# Patient Record
Sex: Female | Born: 1990 | Race: Black or African American | Hispanic: No | Marital: Single | Smoking: Former smoker
Health system: Southern US, Community
[De-identification: ages and names within clinical notes are randomized; demographics above are authoritative.]

## PROBLEM LIST (undated history)

## (undated) ENCOUNTER — Inpatient Hospital Stay (HOSPITAL_COMMUNITY): Payer: Self-pay

## (undated) ENCOUNTER — Emergency Department (HOSPITAL_COMMUNITY): Admission: EM | Payer: Medicaid Other

## (undated) ENCOUNTER — Inpatient Hospital Stay (HOSPITAL_COMMUNITY): Payer: Medicaid Other | Admitting: Obstetrics & Gynecology

## (undated) DIAGNOSIS — R062 Wheezing: Secondary | ICD-10-CM

## (undated) DIAGNOSIS — Z309 Encounter for contraceptive management, unspecified: Secondary | ICD-10-CM

## (undated) DIAGNOSIS — G43909 Migraine, unspecified, not intractable, without status migrainosus: Secondary | ICD-10-CM

## (undated) DIAGNOSIS — IMO0002 Reserved for concepts with insufficient information to code with codable children: Secondary | ICD-10-CM

## (undated) DIAGNOSIS — S39011A Strain of muscle, fascia and tendon of abdomen, initial encounter: Secondary | ICD-10-CM

## (undated) DIAGNOSIS — Z3009 Encounter for other general counseling and advice on contraception: Secondary | ICD-10-CM

## (undated) DIAGNOSIS — Z8619 Personal history of other infectious and parasitic diseases: Secondary | ICD-10-CM

## (undated) DIAGNOSIS — K429 Umbilical hernia without obstruction or gangrene: Secondary | ICD-10-CM

## (undated) DIAGNOSIS — R309 Painful micturition, unspecified: Principal | ICD-10-CM

## (undated) DIAGNOSIS — R1011 Right upper quadrant pain: Secondary | ICD-10-CM

## (undated) DIAGNOSIS — Z8742 Personal history of other diseases of the female genital tract: Secondary | ICD-10-CM

## (undated) DIAGNOSIS — R319 Hematuria, unspecified: Secondary | ICD-10-CM

## (undated) DIAGNOSIS — J40 Bronchitis, not specified as acute or chronic: Secondary | ICD-10-CM

## (undated) DIAGNOSIS — R110 Nausea: Secondary | ICD-10-CM

## (undated) DIAGNOSIS — N760 Acute vaginitis: Secondary | ICD-10-CM

## (undated) DIAGNOSIS — N921 Excessive and frequent menstruation with irregular cycle: Secondary | ICD-10-CM

## (undated) DIAGNOSIS — B9689 Other specified bacterial agents as the cause of diseases classified elsewhere: Secondary | ICD-10-CM

## (undated) DIAGNOSIS — N390 Urinary tract infection, site not specified: Secondary | ICD-10-CM

## (undated) DIAGNOSIS — N739 Female pelvic inflammatory disease, unspecified: Secondary | ICD-10-CM

## (undated) DIAGNOSIS — R634 Abnormal weight loss: Principal | ICD-10-CM

## (undated) DIAGNOSIS — B379 Candidiasis, unspecified: Secondary | ICD-10-CM

## (undated) DIAGNOSIS — N939 Abnormal uterine and vaginal bleeding, unspecified: Principal | ICD-10-CM

## (undated) DIAGNOSIS — N898 Other specified noninflammatory disorders of vagina: Secondary | ICD-10-CM

## (undated) DIAGNOSIS — N926 Irregular menstruation, unspecified: Secondary | ICD-10-CM

## (undated) DIAGNOSIS — Z30017 Encounter for initial prescription of implantable subdermal contraceptive: Principal | ICD-10-CM

## (undated) DIAGNOSIS — Z975 Presence of (intrauterine) contraceptive device: Secondary | ICD-10-CM

## (undated) DIAGNOSIS — O98211 Gonorrhea complicating pregnancy, first trimester: Secondary | ICD-10-CM

## (undated) DIAGNOSIS — R630 Anorexia: Secondary | ICD-10-CM

## (undated) HISTORY — DX: Strain of muscle, fascia and tendon of abdomen, initial encounter: S39.011A

## (undated) HISTORY — DX: Encounter for other general counseling and advice on contraception: Z30.09

## (undated) HISTORY — DX: Abnormal weight loss: R63.4

## (undated) HISTORY — DX: Personal history of other infectious and parasitic diseases: Z86.19

## (undated) HISTORY — DX: Right upper quadrant pain: R10.11

## (undated) HISTORY — DX: Anorexia: R63.0

## (undated) HISTORY — DX: Abnormal uterine and vaginal bleeding, unspecified: N93.9

## (undated) HISTORY — DX: Hematuria, unspecified: R31.9

## (undated) HISTORY — DX: Encounter for contraceptive management, unspecified: Z30.9

## (undated) HISTORY — DX: Other specified noninflammatory disorders of vagina: N89.8

## (undated) HISTORY — DX: Irregular menstruation, unspecified: N92.6

## (undated) HISTORY — DX: Wheezing: R06.2

## (undated) HISTORY — DX: Other specified bacterial agents as the cause of diseases classified elsewhere: B96.89

## (undated) HISTORY — DX: Painful micturition, unspecified: R30.9

## (undated) HISTORY — DX: Encounter for initial prescription of implantable subdermal contraceptive: Z30.017

## (undated) HISTORY — DX: Nausea: R11.0

## (undated) HISTORY — DX: Umbilical hernia without obstruction or gangrene: K42.9

## (undated) HISTORY — DX: Reserved for concepts with insufficient information to code with codable children: IMO0002

## (undated) HISTORY — DX: Personal history of other diseases of the female genital tract: Z87.42

## (undated) HISTORY — DX: Gonorrhea complicating pregnancy, first trimester: O98.211

## (undated) HISTORY — DX: Presence of (intrauterine) contraceptive device: Z97.5

## (undated) HISTORY — PX: WISDOM TOOTH EXTRACTION: SHX21

## (undated) HISTORY — DX: Candidiasis, unspecified: B37.9

## (undated) HISTORY — DX: Female pelvic inflammatory disease, unspecified: N73.9

## (undated) HISTORY — DX: Acute vaginitis: N76.0

## (undated) HISTORY — DX: Excessive and frequent menstruation with irregular cycle: N92.1

## (undated) HISTORY — PX: OTHER SURGICAL HISTORY: SHX169

---
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
## 2012-01-02 NOTE — L&D Delivery Note (Signed)
Delivery Note At 12:57 PM a viable female was delivered via Vaginal, Spontaneous Delivery (Presentation: Right Occiput Anterior) after moderate to severe variables during second stage. APGAR: 8, 9; vigorous infant. Weight 6 lb 8.1 oz (2951 g). Dr. Macon Large present in case vacuum needed--was not needed. NICU present due to preterm delivery and variables. Placenta status: Adherent, Retained. Cord: 3 vessels.  Incomplete removal by Dorathy Kinsman, CNM and Catalina Antigua MD.  1325: Placenta visible high in vagina. Unable to deliver w/ pt pushing efforts and gentle cord traction. Pitocin started. VSS. Waited  20 minutes. 1345: Placenta still would not delivery w/ normal interventions. Pitocin turned off. Fentanyl 50 IV. Attempted manual removal. Tight band btw upper and LUS. Unable to remove placenta. VSS. Pitocin turned off. Waited 5 minutes. Attempted removal again. Several small pieces removed, passed ~500 ml blood and clots so far, but not actively bleeding heavily. VSS. Fentanyl 50 nl IV given. Called attending and code hemorrhage. 2 Units PRBCs prepared.  1356: Dr. Jolayne Panther at [facility] attempting removal, unable. Nitro given to relax uterus and Dilaudid 1 mg IV given. Still unable to remove. Dr. Jolayne Panther informed pt that she is unable to remove placenta in room. Need D&C on OR. Pt gives written and verbal consent. Dr. Melynda Keller note.   Anesthesia: Local  Episiotomy: None Lacerations: None Suture Repair: NA Est. Blood Loss (mL): 700 ml  Mom to OR.  Baby to nursery-stable.  Dorathy Kinsman 03/18/2012, 3:46 PM

## 2012-01-28 ENCOUNTER — Inpatient Hospital Stay (HOSPITAL_COMMUNITY): Payer: Medicaid Other

## 2012-01-28 ENCOUNTER — Inpatient Hospital Stay (HOSPITAL_COMMUNITY)
Admission: AD | Admit: 2012-01-28 | Discharge: 2012-01-28 | Disposition: A | Discharge status: Home / Self Care | Payer: Medicaid Other | Source: Ambulatory Visit | Attending: Obstetrics & Gynecology | Admitting: Obstetrics & Gynecology

## 2012-01-28 ENCOUNTER — Encounter (HOSPITAL_COMMUNITY): Payer: Self-pay | Admitting: *Deleted

## 2012-01-28 DIAGNOSIS — F1290 Cannabis use, unspecified, uncomplicated: Secondary | ICD-10-CM | POA: Clinically undetermined

## 2012-01-28 DIAGNOSIS — O4100X0 Oligohydramnios, unspecified trimester, not applicable or unspecified: Secondary | ICD-10-CM | POA: Insufficient documentation

## 2012-01-28 DIAGNOSIS — R109 Unspecified abdominal pain: Secondary | ICD-10-CM | POA: Insufficient documentation

## 2012-01-28 LAB — AMNISURE RUPTURE OF MEMBRANE (ROM) NOT AT ARMC: Amnisure ROM: NEGATIVE

## 2012-01-28 LAB — WET PREP, GENITAL

## 2012-01-28 LAB — OB RESULTS CONSOLE RPR: RPR: NONREACTIVE

## 2012-01-28 NOTE — MAU Provider Note (Signed)
Seen by me also and agree with note Wynelle Bourgeois CNM

## 2012-01-28 NOTE — MAU Provider Note (Signed)
Attestation of Attending Supervision of Advanced Practitioner (CNM/NP): Evaluation and management procedures were performed by the Advanced Practitioner under my supervision and collaboration. I have reviewed the Advanced Practitioner's note and chart, and I agree with the management and plan.  Dezman Granda H. 10:42 PM   

## 2012-01-28 NOTE — MAU Note (Signed)
Patient states she was sent from [facility] for evaluation of low fluid. States she has been leaking clear fluid since 1-23. Not enough to wear a pad. States she has some contractions off and on but none now. Reports good fetal movement.

## 2012-01-28 NOTE — MAU Provider Note (Addendum)
History   Lori Johnston is a 22 y.o. G2P1001 at [redacted]w[redacted]d who presents to the MAU from a routine prenatal visit, where she revealed that she has been noticing fluid leakage since last Thursday (5 days ago). She reports that she had some lower abdominal pain that she thought was round ligament pain and/or stress-induced, then urinated and noticed that fluid continued to leak into the toilet after micturition was complete. Since that time, she's noticed a slow leak and has been needing to wear panty liners.  She has not noted any bleeding, cramping, or contractions, and continues to feel good fetal movement. At her routine prenatal visit today she was noted to have oligohydramnios with an AFI 6.25.   CSN: [id number]  Arrival date and time: 01/28/12 1239   None     Chief Complaint  Patient presents with  . Rupture of Membranes   HPI  OB History    Grav Para Term Preterm Abortions TAB SAB Ect Mult Living   2 1 1  0 0 0 0 0 0 1      Past Medical History  Diagnosis Date  . Bronchitis   . Migraine   . Pregnant     Past Surgical History  Procedure Date  . Wisdom tooth extraction     Family History  Problem Relation Age of Onset  . Anesthesia problems Neg Hx   . Hypotension Neg Hx   . Malignant hyperthermia Neg Hx   . Pseudochol deficiency Neg Hx     History  Substance Use Topics  . Smoking status: Former Smoker -- 0.2 packs/day for 2 years    Types: Cigarettes    Quit date: 08/12/2010  . Smokeless tobacco: Never Used  . Alcohol Use: No    Allergies:  Allergies  Allergen Reactions  . Bee Venom Anaphylaxis  . Other Anaphylaxis and Rash    Tomatoes. Swelling    Prescriptions prior to admission  Medication Sig Dispense Refill  . hydrocortisone cream 1 % Apply 1 application topically daily.      Burnis Medin w/o A Vit-FeFum-FePo-FA (PROVIDA OB) 20-20-1.25 MG CAPS Take 1 capsule by mouth daily.         Review of Systems  Constitutional: Negative for fever and chills.  Eyes:  Negative for blurred vision and double vision.  Respiratory: Negative for cough and wheezing.   Cardiovascular: Negative for chest pain.  Gastrointestinal: Negative for nausea, vomiting, abdominal pain and diarrhea.  Genitourinary: Negative for dysuria and hematuria.  Musculoskeletal: Positive for back pain.  Neurological: Positive for headaches. Negative for dizziness and tingling.   Physical Exam   Blood pressure 108/54, pulse 90, temperature 98.1 F (36.7 C), temperature source Oral, resp. rate 16, last menstrual period 07/16/2011, SpO2 100.00%, unknown if currently breastfeeding.  Physical Exam  Constitutional: She is oriented to person, place, and time. She appears well-developed and well-nourished. No distress.  HENT:  Head: Normocephalic and atraumatic.  Eyes: Pupils are equal, round, and reactive to light.  Neck: Normal range of motion. Neck supple.  Cardiovascular: Normal rate, regular rhythm, normal heart sounds and intact distal pulses.   No murmur heard. Respiratory: Effort normal and breath sounds normal. She has no wheezes.  GI: Soft. Bowel sounds are normal. There is no tenderness.       gravid  Musculoskeletal: Normal range of motion. She exhibits no edema and no tenderness.  Neurological: She is alert and oriented to person, place, and time.  Skin: Skin is warm and dry.  FHR: 140, +accels, no decels Toco: no contractions noted  SVE performed in office today, deferred in MAU Speculum exam: normal vagina, minimal white discharge, no pooling of fluid  MAU Course  Procedures Results for orders placed during the hospital encounter of 01/28/12 (from the past 24 hour(s))  AMNISURE RUPTURE OF MEMBRANE (ROM)     Status: Normal   Collection Time   01/28/12  1:43 PM      Component Value Range   Amnisure ROM NEGATIVE    WET PREP, GENITAL     Status: Abnormal   Collection Time   01/28/12  3:00 PM      Component Value Range   Yeast Wet Prep HPF POC NONE SEEN  NONE SEEN    Trich, Wet Prep NONE SEEN  NONE SEEN   Clue Cells Wet Prep HPF POC NONE SEEN  NONE SEEN   WBC, Wet Prep HPF POC FEW (*) NONE SEEN   [place] UA doppler:  S/D 2.8, 44th percentile  Assessment and Plan  Lori Johnston is a 22 y.o. G2P1001 at [redacted]w[redacted]d who presented from clinic due to oligohydramnios and possible leakage of fluid for five days. - amnisure was negative - Doppler of UA reassuring at 44th percentile - discharge home with follow up as previously scheduled  CONROY, LOUISA 01/28/2012, 2:29 PM

## 2012-03-17 ENCOUNTER — Encounter: Payer: Self-pay | Admitting: *Deleted

## 2012-03-18 ENCOUNTER — Encounter (HOSPITAL_COMMUNITY): Payer: Self-pay | Admitting: Emergency Medicine

## 2012-03-18 ENCOUNTER — Inpatient Hospital Stay (HOSPITAL_COMMUNITY)
Admission: EM | Admit: 2012-03-18 | Discharge: 2012-03-20 | DRG: 767 | Disposition: A | Discharge status: Home / Self Care | Payer: Medicaid Other | Attending: Obstetrics & Gynecology | Admitting: Obstetrics & Gynecology

## 2012-03-18 ENCOUNTER — Encounter (HOSPITAL_COMMUNITY)
Admission: EM | Disposition: A | Discharge status: Home / Self Care | Payer: Self-pay | Source: Home / Self Care | Attending: Obstetrics & Gynecology

## 2012-03-18 ENCOUNTER — Inpatient Hospital Stay (HOSPITAL_COMMUNITY): Payer: Medicaid Other | Admitting: Anesthesiology

## 2012-03-18 ENCOUNTER — Encounter (HOSPITAL_COMMUNITY): Payer: Self-pay | Admitting: Anesthesiology

## 2012-03-18 DIAGNOSIS — O9903 Anemia complicating the puerperium: Secondary | ICD-10-CM | POA: Diagnosis not present

## 2012-03-18 DIAGNOSIS — Z3490 Encounter for supervision of normal pregnancy, unspecified, unspecified trimester: Secondary | ICD-10-CM

## 2012-03-18 DIAGNOSIS — O4290 Premature rupture of membranes, unspecified as to length of time between rupture and onset of labor, unspecified weeks of gestation: Principal | ICD-10-CM | POA: Diagnosis present

## 2012-03-18 DIAGNOSIS — O4100X0 Oligohydramnios, unspecified trimester, not applicable or unspecified: Secondary | ICD-10-CM | POA: Diagnosis present

## 2012-03-18 DIAGNOSIS — IMO0001 Reserved for inherently not codable concepts without codable children: Secondary | ICD-10-CM

## 2012-03-18 DIAGNOSIS — D649 Anemia, unspecified: Secondary | ICD-10-CM | POA: Diagnosis not present

## 2012-03-18 HISTORY — PX: DILATION AND CURETTAGE OF UTERUS: SHX78

## 2012-03-18 LAB — CBC
HCT: 20.7 % — ABNORMAL LOW (ref 36.0–46.0)
Hemoglobin: 7.7 g/dL — ABNORMAL LOW (ref 12.0–15.0)
Hemoglobin: 6.9 g/dL — CL (ref 12.0–15.0)
MCH: 27.3 pg (ref 26.0–34.0)
MCH: 27.4 pg (ref 26.0–34.0)
MCHC: 33.1 g/dL (ref 30.0–36.0)
MCHC: 33.5 g/dL (ref 30.0–36.0)
MCHC: 33.3 g/dL (ref 30.0–36.0)
MCV: 82.1 fL (ref 78.0–100.0)
Platelets: 179 10*3/uL (ref 150–400)
RDW: 13.7 % (ref 11.5–15.5)

## 2012-03-18 LAB — PROTIME-INR: Prothrombin Time: 14.3 seconds (ref 11.6–15.2)

## 2012-03-18 LAB — POSTPARTUM HEMORRHAGE PROTOCOL (BB NOTIFICATION)

## 2012-03-18 LAB — SAVE SMEAR

## 2012-03-18 LAB — GROUP B STREP BY PCR: Group B strep by PCR: NEGATIVE

## 2012-03-18 LAB — POCT FERN TEST: POCT Fern Test: POSITIVE

## 2012-03-18 LAB — RPR: RPR Ser Ql: NONREACTIVE

## 2012-03-18 SURGERY — DILATION AND CURETTAGE
Anesthesia: Spinal | Site: Vagina

## 2012-03-18 MED ORDER — WITCH HAZEL-GLYCERIN EX PADS: 1.0000 "application " | MEDICATED_PAD | CUTANEOUS | Status: DC | PRN

## 2012-03-18 MED ORDER — ONDANSETRON HCL 4 MG/2ML IJ SOLN: 4.0000 mg | Freq: Four times a day (QID) | INTRAMUSCULAR | Status: DC | PRN

## 2012-03-18 MED ORDER — ONDANSETRON HCL 4 MG/2ML IJ SOLN
INTRAMUSCULAR | Status: AC
  Filled 2012-03-18: qty 2

## 2012-03-18 MED ORDER — OXYTOCIN BOLUS FROM INFUSION: 500.0000 mL | INTRAVENOUS | Status: DC

## 2012-03-18 MED ORDER — PRENATAL MULTIVITAMIN CH
1.0000 | ORAL_TABLET | Freq: Every day | ORAL | Status: DC
  Administered 2012-03-19 – 2012-03-20 (×2): 1 via ORAL
  Filled 2012-03-18 (×2): qty 1

## 2012-03-18 MED ORDER — PHENYLEPHRINE 40 MCG/ML (10ML) SYRINGE FOR IV PUSH (FOR BLOOD PRESSURE SUPPORT)
PREFILLED_SYRINGE | INTRAVENOUS | Status: AC
  Filled 2012-03-18: qty 5

## 2012-03-18 MED ORDER — DIBUCAINE 1 % RE OINT: 1.0000 "application " | TOPICAL_OINTMENT | RECTAL | Status: DC | PRN

## 2012-03-18 MED ORDER — MISOPROSTOL 200 MCG PO TABS
ORAL_TABLET | ORAL | Status: AC
  Filled 2012-03-18: qty 5

## 2012-03-18 MED ORDER — LANOLIN HYDROUS EX OINT: TOPICAL_OINTMENT | CUTANEOUS | Status: DC | PRN

## 2012-03-18 MED ORDER — TETANUS-DIPHTH-ACELL PERTUSSIS 5-2.5-18.5 LF-MCG/0.5 IM SUSP: 0.5000 mL | Freq: Once | INTRAMUSCULAR | Status: DC

## 2012-03-18 MED ORDER — LIDOCAINE IN DEXTROSE 5-7.5 % IV SOLN
INTRAVENOUS | Status: AC
  Filled 2012-03-18: qty 2

## 2012-03-18 MED ORDER — PENICILLIN G POTASSIUM 5000000 UNITS IJ SOLR
2.5000 10*6.[IU] | INTRAMUSCULAR | Status: DC
  Filled 2012-03-18 (×5): qty 2.5

## 2012-03-18 MED ORDER — FENTANYL CITRATE 0.05 MG/ML IJ SOLN
INTRAMUSCULAR | Status: AC
  Filled 2012-03-18: qty 5

## 2012-03-18 MED ORDER — ACETAMINOPHEN 325 MG PO TABS: 650.0000 mg | ORAL_TABLET | ORAL | Status: DC | PRN

## 2012-03-18 MED ORDER — OXYCODONE-ACETAMINOPHEN 5-325 MG PO TABS: 1.0000 | ORAL_TABLET | ORAL | Status: DC | PRN

## 2012-03-18 MED ORDER — FENTANYL CITRATE 0.05 MG/ML IJ SOLN: 25.0000 ug | INTRAMUSCULAR | Status: DC | PRN

## 2012-03-18 MED ORDER — MISOPROSTOL 100 MCG PO TABS
ORAL_TABLET | ORAL | Status: DC | PRN
  Administered 2012-03-18: 1000 ug

## 2012-03-18 MED ORDER — NITROGLYCERIN 0.4 MG/SPRAY TL SOLN
Status: DC | PRN
  Administered 2012-03-18: 2 via SUBLINGUAL

## 2012-03-18 MED ORDER — ONDANSETRON HCL 4 MG PO TABS: 4.0000 mg | ORAL_TABLET | ORAL | Status: DC | PRN

## 2012-03-18 MED ORDER — IBUPROFEN 600 MG PO TABS
600.0000 mg | ORAL_TABLET | Freq: Four times a day (QID) | ORAL | Status: DC
  Administered 2012-03-18 – 2012-03-20 (×7): 600 mg via ORAL
  Filled 2012-03-18 (×7): qty 1

## 2012-03-18 MED ORDER — EPHEDRINE SULFATE 50 MG/ML IJ SOLN
INTRAMUSCULAR | Status: DC | PRN
  Administered 2012-03-18 (×3): 5 mg via INTRAVENOUS

## 2012-03-18 MED ORDER — SIMETHICONE 80 MG PO CHEW: 80.0000 mg | CHEWABLE_TABLET | ORAL | Status: DC | PRN

## 2012-03-18 MED ORDER — LACTATED RINGERS IV SOLN: 500.0000 mL | INTRAVENOUS | Status: DC | PRN

## 2012-03-18 MED ORDER — MIDAZOLAM HCL 2 MG/2ML IJ SOLN
INTRAMUSCULAR | Status: AC
  Filled 2012-03-18: qty 2

## 2012-03-18 MED ORDER — OXYTOCIN 40 UNITS IN LACTATED RINGERS INFUSION - SIMPLE MED
62.5000 mL/h | INTRAVENOUS | Status: DC
  Administered 2012-03-18: 62.5 mL/h via INTRAVENOUS
  Filled 2012-03-18: qty 1000

## 2012-03-18 MED ORDER — HYDROMORPHONE HCL PF 1 MG/ML IJ SOLN: 2.0000 mg | Freq: Once | INTRAMUSCULAR | Status: DC

## 2012-03-18 MED ORDER — CHLOROPROCAINE HCL 1 % IJ SOLN
INTRAMUSCULAR | Status: AC
  Filled 2012-03-18: qty 30

## 2012-03-18 MED ORDER — ONDANSETRON HCL 4 MG/2ML IJ SOLN: 4.0000 mg | INTRAMUSCULAR | Status: DC | PRN

## 2012-03-18 MED ORDER — LACTATED RINGERS IV SOLN
INTRAVENOUS | Status: DC
  Administered 2012-03-18 (×2): via INTRAVENOUS

## 2012-03-18 MED ORDER — DIPHENHYDRAMINE HCL 25 MG PO CAPS: 25.0000 mg | ORAL_CAPSULE | Freq: Four times a day (QID) | ORAL | Status: DC | PRN

## 2012-03-18 MED ORDER — PIPERACILLIN-TAZOBACTAM 3.375 G IVPB
3.3750 g | Freq: Three times a day (TID) | INTRAVENOUS | Status: AC
  Administered 2012-03-18 – 2012-03-19 (×3): 3.375 g via INTRAVENOUS
  Filled 2012-03-18 (×3): qty 50

## 2012-03-18 MED ORDER — NITROGLYCERIN 0.4 MG/SPRAY TL SOLN
1.0000 | Status: DC | PRN
  Administered 2012-03-18: 3 via SUBLINGUAL
  Filled 2012-03-18: qty 4.9

## 2012-03-18 MED ORDER — FENTANYL CITRATE 0.05 MG/ML IJ SOLN
50.0000 ug | INTRAMUSCULAR | Status: DC | PRN
  Administered 2012-03-18 (×3): 50 ug via INTRAVENOUS
  Filled 2012-03-18 (×2): qty 2

## 2012-03-18 MED ORDER — LIDOCAINE HCL (PF) 1 % IJ SOLN
30.0000 mL | INTRAMUSCULAR | Status: AC | PRN
  Administered 2012-03-18: 30 mL via SUBCUTANEOUS
  Filled 2012-03-18 (×2): qty 30

## 2012-03-18 MED ORDER — PROPOFOL 10 MG/ML IV EMUL
INTRAVENOUS | Status: AC
  Filled 2012-03-18: qty 20

## 2012-03-18 MED ORDER — DEXTROSE 5 % IV SOLN
5.0000 10*6.[IU] | Freq: Once | INTRAVENOUS | Status: AC
  Administered 2012-03-18: 5 10*6.[IU] via INTRAVENOUS
  Filled 2012-03-18: qty 5

## 2012-03-18 MED ORDER — SODIUM CHLORIDE 0.9 % IV SOLN
INTRAVENOUS | Status: DC | PRN
  Administered 2012-03-18: 14:00:00 via INTRAVENOUS

## 2012-03-18 MED ORDER — IBUPROFEN 600 MG PO TABS: 600.0000 mg | ORAL_TABLET | Freq: Four times a day (QID) | ORAL | Status: DC | PRN

## 2012-03-18 MED ORDER — OXYTOCIN 10 UNIT/ML IJ SOLN
40.0000 [IU] | INTRAVENOUS | Status: DC | PRN
  Administered 2012-03-18: 40 [IU] via INTRAVENOUS

## 2012-03-18 MED ORDER — HYDROMORPHONE BOLUS VIA INFUSION: 2.0000 mg | Freq: Once | INTRAVENOUS | Status: DC

## 2012-03-18 MED ORDER — SENNOSIDES-DOCUSATE SODIUM 8.6-50 MG PO TABS
2.0000 | ORAL_TABLET | Freq: Every day | ORAL | Status: DC
  Administered 2012-03-18 – 2012-03-19 (×2): 2 via ORAL

## 2012-03-18 MED ORDER — CITRIC ACID-SODIUM CITRATE 334-500 MG/5ML PO SOLN
30.0000 mL | ORAL | Status: DC | PRN
  Administered 2012-03-18: 30 mL via ORAL
  Filled 2012-03-18: qty 15

## 2012-03-18 MED ORDER — ONDANSETRON HCL 4 MG/2ML IJ SOLN
INTRAMUSCULAR | Status: DC | PRN
  Administered 2012-03-18: 4 mg via INTRAVENOUS

## 2012-03-18 MED ORDER — EPHEDRINE 5 MG/ML INJ
INTRAVENOUS | Status: AC
  Filled 2012-03-18: qty 10

## 2012-03-18 MED ORDER — HYDROMORPHONE HCL PF 1 MG/ML IJ SOLN
INTRAMUSCULAR | Status: AC
  Administered 2012-03-18: 1 mg
  Filled 2012-03-18: qty 2

## 2012-03-18 MED ORDER — KETOROLAC TROMETHAMINE 30 MG/ML IJ SOLN: 15.0000 mg | Freq: Once | INTRAMUSCULAR | Status: DC | PRN

## 2012-03-18 MED ORDER — BENZOCAINE-MENTHOL 20-0.5 % EX AERO: 1.0000 "application " | INHALATION_SPRAY | CUTANEOUS | Status: DC | PRN

## 2012-03-18 MED ORDER — LIDOCAINE IN DEXTROSE 5-7.5 % IV SOLN
INTRAVENOUS | Status: DC | PRN
  Administered 2012-03-18: 1.2 mL via INTRATHECAL

## 2012-03-18 MED ORDER — PHENYLEPHRINE HCL 10 MG/ML IJ SOLN
INTRAMUSCULAR | Status: DC | PRN
  Administered 2012-03-18 (×2): 40 ug via INTRAVENOUS
  Administered 2012-03-18: 80 ug via INTRAVENOUS
  Administered 2012-03-18: 40 ug via INTRAVENOUS

## 2012-03-18 MED ORDER — NITROGLYCERIN 0.4 MG/SPRAY TL SOLN
Status: AC
  Filled 2012-03-18: qty 4.9

## 2012-03-18 SURGICAL SUPPLY — 17 items
CATH ROBINSON RED A/P 16FR (CATHETERS) ×2 IMPLANT
DECANTER SPIKE VIAL GLASS SM (MISCELLANEOUS) ×2 IMPLANT
DRESSING TELFA 8X3 (GAUZE/BANDAGES/DRESSINGS) ×2 IMPLANT
GLOVE BIOGEL PI IND STRL 6.5 (GLOVE) ×1 IMPLANT
GLOVE BIOGEL PI INDICATOR 6.5 (GLOVE) ×1
GLOVE SURG SS PI 6.0 STRL IVOR (GLOVE) ×2 IMPLANT
GOWN STRL REIN XL XLG (GOWN DISPOSABLE) ×4 IMPLANT
NEEDLE SPNL 22GX3.5 QUINCKE BK (NEEDLE) ×2 IMPLANT
NS IRRIG 1000ML POUR BTL (IV SOLUTION) ×2 IMPLANT
PACK VAGINAL MINOR WOMEN LF (CUSTOM PROCEDURE TRAY) ×2 IMPLANT
PAD OB MATERNITY 4.3X12.25 (PERSONAL CARE ITEMS) ×2 IMPLANT
PAD PREP 24X48 CUFFED NSTRL (MISCELLANEOUS) ×2 IMPLANT
SYR CONTROL 10ML LL (SYRINGE) ×2 IMPLANT
TOWEL OR 17X24 6PK STRL BLUE (TOWEL DISPOSABLE) ×4 IMPLANT
TUBE VACURETTE 2ND TRIMESTER (CANNULA) ×2 IMPLANT
VACURETTE 16MM ASPIR CVD .5 (CANNULA) ×2 IMPLANT
WATER STERILE IRR 1000ML POUR (IV SOLUTION) ×2 IMPLANT

## 2012-03-18 NOTE — Progress Notes (Signed)
Lori Johnston is a 22 y.o. G2P1001 at [redacted]w[redacted]d.  Subjective: Contractions stronger and closer. Requesting IV pain meds, but not epidural.   Objective: BP 118/62  Pulse 87  Temp(Src) 98.2 F (36.8 C) (Oral)  Resp 18  Ht 5\' 8"  (1.727 m)  Wt 68.947 kg (152 lb)  BMI 23.12 kg/m2  SpO2 100%  LMP 07/16/2011      FHT:  FHR: 130 bpm, variability: moderate,  accelerations:  Present,  decelerations:  Absent UC:   regular, every 5 minutes SVE:   Dilation: 6 Effacement (%): 90 Station: 0 Exam by:: Dorathy Kinsman, CNM  Labs: Lab Results  Component Value Date   WBC 13.4* 03/18/2012   HGB 9.1* 03/18/2012   HCT 27.5* 03/18/2012   MCV 81.8 03/18/2012   PLT 179 03/18/2012    Assessment / Plan: Spontaneous labor, progressing normally  Labor: Progressing normally Preeclampsia:  NA Fetal Wellbeing:  Category I Pain Control:  Fentanyl I/D:  n/a Anticipated MOD:  NSVD NICU notified of impending PTD.   Dorathy Kinsman 03/18/2012, 10:33 AM

## 2012-03-18 NOTE — H&P (Signed)
Lori Johnston is a 22 y.o. female presenting for SROM. Maternal Medical History:  Reason for admission: Rupture of membranes.  Nausea.  Contractions: Onset was 1-2 hours ago.   Frequency: regular.   Duration is approximately 1 minute.   Perceived severity is moderate.    Fetal activity: Perceived fetal activity is normal.   Last perceived fetal movement was within the past hour.    Prenatal complications: Oligohydramnios.   No bleeding, HIV, IUGR, polyhydramnios or pre-eclampsia.     # SROM: Pt mentions being awaken from sleep due to SROM while at home at approximately 0200.  Pt was then transferred from [facility] for anticipated SVD.  Pt reports recent continious fetal movement, denies large loss of blood, recent increased in discharge, pain with urination or increased urinary frequency.    OB History   Grav Para Term Preterm Abortions TAB SAB Ect Mult Living   2 1 1  0 0 0 0 0 0 1     Past Medical History  Diagnosis Date  . Bronchitis   . Migraine   . Pregnant   . History of chlamydia    Past Surgical History  Procedure Laterality Date  . Wisdom tooth extraction     Family History: family history includes Diabetes in her cousin.  There is no history of Anesthesia problems, and Hypotension, and Malignant hyperthermia, and Pseudochol deficiency, . Social History:  reports that she quit smoking about 19 months ago. Her smoking use included Cigarettes. She has a .4 pack-year smoking history. She has never used smokeless tobacco. She reports that she does not drink alcohol or use illicit drugs.   Prenatal Transfer Tool  Maternal Diabetes: No Genetic Screening: Normal Maternal Ultrasounds/Referrals: Normal Fetal Ultrasounds or other Referrals:  None Maternal Substance Abuse:  No Significant Maternal Medications:  None Significant Maternal Lab Results:  Lab values include: Other: GBS unknown, positive THC Other Comments:  None  Review of Systems  Constitutional: Negative for  fever and chills.  Eyes: Negative for blurred vision and double vision.  Respiratory: Negative for cough and hemoptysis.   Cardiovascular: Negative for chest pain, palpitations, orthopnea and leg swelling.  Gastrointestinal: Positive for abdominal pain. Negative for nausea, vomiting, diarrhea, constipation and blood in stool.  Genitourinary: Negative for dysuria and urgency.  Musculoskeletal: Negative for myalgias and joint pain.  Skin: Negative for itching and rash.  Neurological: Negative for headaches.  Endo/Heme/Allergies: Negative for environmental allergies. Does not bruise/bleed easily.  All other systems reviewed and are negative.      Blood pressure 107/56, pulse 84, temperature 98.8 F (37.1 C), temperature source Oral, resp. rate 18, height 5\' 8"  (1.727 m), weight 68.947 kg (152 lb), last menstrual period 07/16/2011, SpO2 100.00%. Maternal Exam:  Uterine Assessment: Contraction strength is moderate.  Contraction duration is 1 minute. Contraction frequency is regular.   Introitus: Ferning test: positive.   Cervix: not evaluated.   Fetal Exam Fetal Monitor Review: Mode: ultrasound.   Baseline rate: 140.  Variability: moderate (6-25 bpm).   Pattern: accelerations present and no decelerations.    Fetal State Assessment: Category I - tracings are normal.     Physical Exam  Constitutional: She is oriented to person, place, and time. She appears well-developed and well-nourished.  HENT:  Head: Normocephalic and atraumatic.  Eyes: EOM are normal. Pupils are equal, round, and reactive to light.  Neck: Normal range of motion. Neck supple.  Cardiovascular: Normal rate, regular rhythm, normal heart sounds and intact distal pulses.  Exam  reveals no gallop and no friction rub.   No murmur heard. Respiratory: Effort normal and breath sounds normal. No respiratory distress. She has no wheezes. She has no rales.  GI: Soft. Bowel sounds are normal. There is no tenderness. There  is no rebound.  Musculoskeletal: Normal range of motion. She exhibits no edema and no tenderness.  Neurological: She is alert and oriented to person, place, and time.  Skin: Skin is warm.  Psychiatric: She has a normal mood and affect. Her behavior is normal. Thought content normal.    Cervical exam: Pt reports 2cm dilation from referring hospital.   Prenatal labs: ABO, Rh: O/Positive/-- (09/30 0000) Antibody: Negative (09/30 0000) Rubella: Immune (09/30 0000) RPR: Nonreactive (01/27 0000)  HBsAg: Negative (09/30 0000)  HIV: Non-reactive (01/27 0000)  GBS: Pending    Assessment/Plan: 22 y/o G2P1001 at [redacted]w[redacted]d presenting with SROM  # SOL: Pt reports large gush of fluid at 0200 with positive nitrazine test at referring hospital.   -- positive ferning on exam -- admit to L&D for anticipated NSVD -- pcn prophylaxis for GBS unknown status   DISPO: Diet: thin Activity: up ab lib Nursing: Per floor protocol Electrolytes: replace as needed Prophylaxis: GI: none; DVT early ambulation Code: Full  Ancipitate the pt to be admitted for 2 midnight(s) for anticipated NSVD.      Gregor Hams 03/18/2012, 7:07 AM  Dating by LMP, verified by 11 week [place].   I was present for the exam and agree with above.  [place], CNM 03/18/2012 11:26 AM

## 2012-03-18 NOTE — Progress Notes (Signed)
03/18/12 1500  Clinical Encounter Type  Visited With Patient not available  Visit Type Patient in surgery  Referral From Nurse   Spiritual Care is aware of postpartum hemorrhage and pt's trip to OR.  Following from a distance this afternoon and will follow up with patient and family tomorrow.   Please page [phone] for spiritual/emotional support as needed/desired.  Thank you.  [address] [place], [place] [phone]

## 2012-03-18 NOTE — ED Notes (Signed)
Pt [redacted] weeks pregnant, states she thinks her water broke. Due date is 4-21. States this is her second child. Unsure of how far apart contractions are but states she is having them.

## 2012-03-18 NOTE — ED Notes (Signed)
[facility] here 

## 2012-03-18 NOTE — Transfer of Care (Signed)
Immediate Anesthesia Transfer of Care Note  Patient: Lori Johnston  Procedure(s) Performed: Procedure(s): DILATATION AND CURETTAGE (N/A)  Patient Location: PACU  Anesthesia Type:Spinal  Level of Consciousness: awake, alert  and oriented  Airway & Oxygen Therapy: Patient Spontanous Breathing  Post-op Assessment: Report given to PACU RN and Post -op Vital signs reviewed and unstable, Anesthesiologist notified  Post vital signs: stable  Complications: No apparent anesthesia complications

## 2012-03-18 NOTE — ED Notes (Signed)
Dr Hyacinth Meeker in room assessing pt, doing cervical exam. States pt is 2cm but completely effaced.

## 2012-03-18 NOTE — MAU Note (Signed)
Large gush of bloody fluid at 0400 am this morning. Contractions since then, hasn't timed them.

## 2012-03-18 NOTE — ED Notes (Signed)
Reported to Sam from [facility], ETA 10 minutes.

## 2012-03-18 NOTE — Op Note (Addendum)
Lori Johnston PROCEDURE DATE: 03/18/2012  PREOPERATIVE DIAGNOSIS: Retained placenta POSTOPERATIVE DIAGNOSIS: The same. PROCEDURE:     Dilation and Evacuation. SURGEON:  Dr. Catalina Antigua  INDICATIONS: 22 y.o. [id number] s/p vaginal delivery of 35 week infant with retained placenta s/p cord evoltion, needing surgical completion.  Risks of surgery were discussed with the patient including but not limited to: bleeding which may require transfusion; infection which may require antibiotics; injury to uterus or surrounding organs;need for additional procedures including laparotomy or laparoscopy; possibility of intrauterine scarring which may impair future fertility; and other postoperative/anesthesia complications. Written informed consent was obtained.    FINDINGS:  A 24 week size postpartum uterus, moderate amounts of products of conception, specimen sent to pathology.  ANESTHESIA:    Spinal  INTRAVENOUS FLUIDS:  2200 ml of LR ESTIMATED BLOOD LOSS:  500 ml. SPECIMENS:  Products of conception sent to pathology COMPLICATIONS:  None immediate.  PROCEDURE DETAILS:  The patient was taken to the operating room where anesthesia was administered and was found to be adequate.  After an adequate timeout was performed, she was placed in the dorsal lithotomy position and examined; then prepped and draped in the sterile manner.  A vaginal speculum was then placed in the patient's vagina and a ring Forceps was applied to the anterior lip of the cervix. Under ultrasound guidance, a 16 mm-suction curette that was gently advanced to the uterine fundus.  The suction device was then activated and curette slowly rotated to clear the uterus of products of conception.  A sharp curettage was then performed to confirm complete emptying of the uterus.  A bimanual exam was then performed to grasped and removed the remaining portion of the placenta which was found adherent to the left cornual region.   All instruments were removed  from the patient's vagina. 1000 mcg cytotec was administered per rectumThe patient tolerated the procedure well and was taken to the recovery area awake, and in stable condition.  The patient will be transferred to postpartum unit and closely monitored. A cbc will obtained in recovery room and decision to transfuse will be made on results and hemodynamic status.

## 2012-03-18 NOTE — ED Provider Notes (Signed)
History     CSN: [id number]  Arrival date & time 03/18/12  0449   First MD Initiated Contact with Patient 03/18/12 0510      Chief Complaint  Patient presents with  . Contractions    (Consider location/radiation/quality/duration/timing/severity/associated sxs/prior treatment) HPI Comments: 22 year old G2 P1 female at 24 weeks pregnancy presents with feeling like her water has broken. She states that she was having contractions throughout the day, has recently been seen by her OB/GYN and told that she was dilated to a 2 at that time. The contractions have not been measured throughout the day but have been coming more frequently, she has had a heavy gush of fluid and presents to the emergency department. The symptoms are persistent, gradually worsening, nothing makes it better or worse, not associated with fevers chills nausea vomiting cough or shortness of breath. She denies any consultations of pregnancy other than occasional urinary infections.  The history is provided by the patient.    Past Medical History  Diagnosis Date  . Bronchitis   . Migraine   . Pregnant   . History of chlamydia     Past Surgical History  Procedure Laterality Date  . Wisdom tooth extraction      Family History  Problem Relation Age of Onset  . Anesthesia problems Neg Hx   . Hypotension Neg Hx   . Malignant hyperthermia Neg Hx   . Pseudochol deficiency Neg Hx   . Diabetes Cousin     History  Substance Use Topics  . Smoking status: Former Smoker -- 0.20 packs/day for 2 years    Types: Cigarettes    Quit date: 08/12/2010  . Smokeless tobacco: Never Used  . Alcohol Use: No    OB History   Grav Para Term Preterm Abortions TAB SAB Ect Mult Living   2 1 1  0 0 0 0 0 0 1      Review of Systems  All other systems reviewed and are negative.    Allergies  Bee venom and Other  Home Medications   Current Outpatient Rx  Name  Route  Sig  Dispense  Refill  . hydrocortisone cream 1 %  Topical   Apply 1 application topically daily.         Burnis Medin w/o A Vit-FeFum-FePo-FA ([facility]) 20-20-1.25 MG CAPS   Oral   Take 1 capsule by mouth daily.            BP 98/71  Pulse 98  Resp 19  Ht 5\' 8"  (1.727 m)  Wt 152 lb (68.947 kg)  BMI 23.12 kg/m2  SpO2 100%  LMP 07/16/2011  Physical Exam  Nursing note and vitals reviewed. Constitutional: She appears well-developed and well-nourished. No distress.  HENT:  Head: Normocephalic and atraumatic.  Mouth/Throat: Oropharynx is clear and moist. No oropharyngeal exudate.  Eyes: Conjunctivae and EOM are normal. Pupils are equal, round, and reactive to light. Right eye exhibits no discharge. Left eye exhibits no discharge. No scleral icterus.  Neck: Normal range of motion. Neck supple. No JVD present. No thyromegaly present.  Cardiovascular: Normal rate, regular rhythm, normal heart sounds and intact distal pulses.  Exam reveals no gallop and no friction rub.   No murmur heard. Pulmonary/Chest: Effort normal and breath sounds normal. No respiratory distress. She has no wheezes. She has no rales.  Abdominal: Soft. Bowel sounds are normal. She exhibits distension ( Gravid). She exhibits no mass. There is no tenderness.  Genitourinary:  Chaperone present for exam, fetus  has low station, dilation to a 2, cervix is almost 90 % effaced  Musculoskeletal: Normal range of motion. She exhibits no edema and no tenderness.  Lymphadenopathy:    She has no cervical adenopathy.  Neurological: She is alert. Coordination normal.  Skin: Skin is warm and dry. No rash noted. No erythema.  Psychiatric: She has a normal mood and affect. Her behavior is normal.    ED Course  Procedures (including critical care time)  Labs Reviewed - No data to display No results found.   1. Active labor   2. PROM (premature rupture of membranes)       MDM  The patient is in active labor, she has had a premature rupture of membranes and will be  transferred [facility]. We have asked her not to push, her contractions are coming regularly, she is on the monitor with women's hospital to monitor the fetal response to contractions, the patient appears stable at this time, I have discussed her care with Dr. Despina Hidden who has accepted transfer of the patient to the women's hospital. Her leg has been called for stat transport  Pt stable on transport - occasional contractions.  Tolerating pain well.  IV placed prior to transfer        Vida Roller, MD 03/18/12 [phone]

## 2012-03-18 NOTE — Anesthesia Postprocedure Evaluation (Signed)
  Anesthesia Post-op Note  Anesthesia Post Note  Patient: Lori Johnston  Procedure(s) Performed: Procedure(s) (LRB): DILATATION AND CURETTAGE (N/A)  Anesthesia type: Spinal  Patient location: PACU  Post pain: Pain level controlled  Post assessment: Post-op Vital signs reviewed  Last Vitals:  Filed Vitals:   03/18/12 1645  BP: 104/54  Pulse: 88  Temp:   Resp: 19    Post vital signs: Reviewed  Level of consciousness: awake  Complications: No apparent anesthesia complications.  Hgb is stable at 7.7.  BP and HR are stable at 104/54 and 88.  Will hold on transfusion for now.  Results communicated to Dr Macon Large as well by PACU RN.  Jasmine December, MD

## 2012-03-18 NOTE — Progress Notes (Signed)
At 1356 a code hemorrhage was called by this RN for pt at request of Dorathy Kinsman, CNM.  Pt had retained placenta; see CNM and MD notes for details of methods used to attempt to deliver placenta.  Interventions were unsuccessful, bleeding was small amount, although did have some moderate bleeding noted since delivery of infant.  At 1357 Dr. Jolayne Panther arrived at bedside to assist CNM.  Also at 1357, the code hemorrhage team arrived with code hemorrhage cart, a second IV line was ordered, and code hemorrhage order set was initiated by this RN, including an order for a type and cross for two units of blood to hold at this time.  Pt had remained NPO since 1330.  Pt had two sips of water at her request between 1300-1330.  At 1405 a second IV line was started upon second attempt by CRNA to left arm-see IV assessment for documentation of this.  At 1400 Dilaudid IV & Nitroglycerin sublingual were ordered; see MAR for documentation of times meds were given. At 1410, decision was made by MD, with pt's verbal consent, to go to OR for D&C to remove retained placenta.  At 1414, pt signed consent for surgery mentioned above, to be done by Dr. Jolayne Panther.  Dr. Jolayne Panther and this RN signed consent as witnesses.  Dr. Rodman Pickle saw pt at 1416 to explain/discuss anesthesia for D&C.  Received call at 1418 from OR to bring pt to room #3, took pt via bed with this RN, two other RN's, and Dr. Rodman Pickle escorting.  See code hemorrhage flowsheet, MD's notes, & MAR for further details.

## 2012-03-18 NOTE — Anesthesia Preprocedure Evaluation (Addendum)
Anesthesia Evaluation  Patient identified by MRN, date of birth, ID band Patient awake    Reviewed: Allergy & Precautions, H&P , NPO status , Patient's Chart, lab work & pertinent test results, reviewed documented beta blocker date and time   History of Anesthesia Complications Negative for: history of anesthetic complications  Airway Mallampati: I TM Distance: >3 FB Neck ROM: full    Dental  (+) Teeth Intact   Pulmonary neg pulmonary ROS,  breath sounds clear to auscultation        Cardiovascular negative cardio ROS  Rhythm:regular Rate:Normal     Neuro/Psych negative neurological ROS  negative psych ROS   GI/Hepatic negative GI ROS, Neg liver ROS,   Endo/Other  negative endocrine ROS  Renal/GU negative Renal ROS  negative genitourinary   Musculoskeletal   Abdominal   Peds  Hematology  (+) Blood dyscrasia (acute blood loss from PPH (EBL 1200 ml)), anemia ,   Anesthesia Other Findings Last ate at 2230 last night, water all day today Given 100 mcg fentanyl and 1-2 mg dilaudid IV on L&D during attempts at manual extraction of placenta - patient is somnolent  Reproductive/Obstetrics (+) Pregnancy (PPH s/p VAVD without epidural, retained placenta)                          Anesthesia Physical Anesthesia Plan  ASA: II and emergent  Anesthesia Plan: Spinal   Post-op Pain Management:    Induction:   Airway Management Planned:   Additional Equipment:   Intra-op Plan:   Post-operative Plan:   Informed Consent: I have reviewed the patients History and Physical, chart, labs and discussed the procedure including the risks, benefits and alternatives for the proposed anesthesia with the patient or authorized representative who has indicated his/her understanding and acceptance.     Plan Discussed with: Surgeon and CRNA  Anesthesia Plan Comments:         Anesthesia Quick  Evaluation

## 2012-03-18 NOTE — Progress Notes (Signed)
Pitocin started at 1325 at 999 ml/hr; infusion stopped at 1345.

## 2012-03-18 NOTE — Anesthesia Procedure Notes (Signed)
Spinal  Patient location during procedure: OR Start time: 03/18/2012 2:38 PM Staffing Performed by: anesthesiologist  Preanesthetic Checklist Completed: patient identified, site marked, surgical consent, pre-op evaluation, timeout performed, IV checked, risks and benefits discussed and monitors and equipment checked Spinal Block Patient position: sitting Prep: site prepped and draped and DuraPrep Patient monitoring: heart rate, cardiac monitor, continuous pulse ox and blood pressure Approach: midline Location: L3-4 Injection technique: single-shot Needle Needle type: Sprotte  Needle gauge: 24 G Needle length: 9 cm Assessment Sensory level: T4 Additional Notes Clear free flow CSF on first attempt.  No paresthesia.  Patient tolerated procedure well with no apparent complications.  Jasmine December, MD

## 2012-03-19 LAB — CBC
MCH: 27 pg (ref 26.0–34.0)
MCHC: 33 g/dL (ref 30.0–36.0)
MCV: 82 fL (ref 78.0–100.0)
Platelets: 155 10*3/uL (ref 150–400)
RBC: 2.22 MIL/uL — ABNORMAL LOW (ref 3.87–5.11)

## 2012-03-19 NOTE — Anesthesia Postprocedure Evaluation (Signed)
Anesthesia Post Note  Patient: Lori Johnston  Procedure(s) Performed: Procedure(s) (LRB): DILATATION AND CURETTAGE (N/A)  Anesthesia type: General  Patient location: [facility]  Post pain: Pain level controlled  Post assessment: Post-op Vital signs reviewed  Last Vitals:  Filed Vitals:   03/19/12 0508  BP: 93/56  Pulse: 90  Temp: 37.2 C  Resp: 18    Post vital signs: Reviewed  Level of consciousness: sedated  Complications: No apparent anesthesia complications

## 2012-03-19 NOTE — Progress Notes (Signed)
03/19/12 1500  Clinical Encounter Type  Visited With Patient  Visit Type Follow-up;Spiritual support;Social support  Spiritual Encounters  Spiritual Needs Emotional   Followed up with Lori Johnston in person to offer spiritual and emotional support after her complications yesterday.  Per pt, she is tired from having so little sleep, but is feeling happy and good physically.    Provided pastoral presence and listening.  Lori Johnston is aware of ongoing chaplain availability and reports no further needs at this time.  [address] [place], [place] [phone]

## 2012-03-19 NOTE — Progress Notes (Signed)
Post Partum Day 1, Post-op Day 1 Subjective: no complaints, up ad lib, voiding, tolerating PO, + flatus and no BM. Denies any lightheadedness, dizziness, weakness, fatigue, dyspnea. Has been up and moving around her room at nurse's advice due to a sore back -- tolerated that well.   Bottle feeding. Wants Depo shot for contraception.   Objective: Blood pressure 93/56, pulse 90, temperature 98.9 F (37.2 C), temperature source Oral, resp. rate 18, height 5\' 8"  (1.727 m), weight 68.947 kg (152 lb), last menstrual period 07/16/2011, SpO2 100.00%, unknown if currently breastfeeding.  Physical Exam:  General: alert, cooperative, appears stated age and no distress Lochia: appropriate Uterine Fundus: firm Incision: no significant drainage DVT Evaluation: No evidence of DVT seen on physical exam. Abdomen: mildly tender Heart and lungs: clear to auscultation   Recent Labs  03/18/12 1807 03/19/12 0620  HGB 6.9* 6.0*  HCT 20.7* 18.2*    Assessment/Plan: Plan for discharge tomorrow Currently asymptomatic for anemia, no need to transfuse at this time. Advised to notify staff ASAP if she feels lightheaded, dizzy, short of breath. Plan on getting DepoProvera at [facility] postpartum follow-up.   LOS: 1 day   Lorna Dibble 03/19/2012, 7:31 AM   Evaluation and management procedures were performed by PA-S under my supervision/collaboration. Chart reviewed, patient examined by me and I agree with management and plan. Remains without orthostatic sx or resting tachycardia POD#1 D&C for retained placenta.  Danae Orleans, CNM 03/19/2012 10:03 AM

## 2012-03-19 NOTE — H&P (Signed)
Attestation of Attending Supervision of Advanced Practitioner (PA/CNM/NP): Evaluation and management procedures were performed by the Advanced Practitioner under my supervision and collaboration.  I have reviewed the Advanced Practitioner's note and chart, and I agree with the management and plan.  Sanah Kraska, MD, FACOG Attending Obstetrician & Gynecologist Faculty Practice, [facility]  
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
## 2012-03-19 NOTE — Progress Notes (Signed)
Ur chart review completed.  

## 2012-03-19 NOTE — Progress Notes (Signed)
CRITICAL VALUE ALERT  Critical value received:  6.0  Date of notification: 03-19-2012  Time of notification:  0647  Critical value read back:yes  Nurse who received alert: Sima Matas RN  MD notified (1st page):   Time of first page:   MD notified (2nd page):  Time of second page:  Responding MD:    Time MD responded:

## 2012-03-20 ENCOUNTER — Encounter: Payer: Self-pay | Admitting: Advanced Practice Midwife

## 2012-03-20 ENCOUNTER — Encounter (HOSPITAL_COMMUNITY): Payer: Self-pay | Admitting: Obstetrics and Gynecology

## 2012-03-20 MED ORDER — SENNOSIDES-DOCUSATE SODIUM 8.6-50 MG PO TABS: 2.0000 | ORAL_TABLET | Freq: Every day | ORAL | Status: DC

## 2012-03-20 MED ORDER — IBUPROFEN 600 MG PO TABS: 600.0000 mg | ORAL_TABLET | Freq: Four times a day (QID) | ORAL | Status: DC

## 2012-03-20 MED ORDER — FERROUS FUMARATE 325 (106 FE) MG PO TABS: 1.0000 | ORAL_TABLET | Freq: Three times a day (TID) | ORAL | Status: DC

## 2012-03-20 NOTE — Discharge Summary (Signed)
Obstetric Discharge Summary Reason for Admission: rupture of membranes and PROM Prenatal Procedures: ultrasound Intrapartum Procedures: spontaneous vaginal delivery Postpartum Procedures: D&C for PPH, retained placenta  Complications-Operative and Postpartum: none Hemoglobin  Date Value Range Status  03/19/2012 6.0* 12.0 - 15.0 g/dL Final     REPEATED TO VERIFY     CRITICAL RESULT CALLED TO, READ BACK BY AND VERIFIED WITH:     PEACE,S @0646  ON [id number] BY FLEMINGS     HCT  Date Value Range Status  03/19/2012 18.2* 36.0 - 46.0 % Final    Physical Exam:  General: alert, cooperative and no distress Lochia: appropriate Uterine Fundus: firm Incision: n/a DVT Evaluation: No evidence of DVT seen on physical exam.  Discharge Diagnoses: Preterm delivery, viable  Discharge Information: Date: 03/20/2012 Activity: pelvic rest Diet: routine Medications: PNV, Ibuprofen, Colace and Iron Condition: stable Instructions: refer to practice specific booklet Discharge to: home   Newborn Data: Live born female  Birth Weight: 6 lb 8.1 oz (2951 g) APGAR: 8, 9  D/c TBD by peds (preterm and weight loss, baby may need to stay in nursery)   [facility] 03/20/2012, 7:51 AM

## 2012-03-20 NOTE — Progress Notes (Signed)
Post Partum Day 2 Subjective: no complaints, up ad lib, voiding, tolerating PO and + flatus Pt asymptomatic from her anemia- no orthosatsis or dizziness  Objective: Blood pressure 106/62, pulse 81, temperature 97.9 F (36.6 C), temperature source Oral, resp. rate 18, height 5\' 8"  (1.727 m), weight 68.947 kg (152 lb), last menstrual period 07/16/2011, SpO2 100.00%, unknown if currently breastfeeding.  Physical Exam:  General: alert and cooperative CV: soft flow murmur, loudest at R 2nd intercostal space Lochia: appropriate Uterine Fundus: firm Incision: n/a DVT Evaluation: No evidence of DVT seen on physical exam.   Recent Labs  03/18/12 1807 03/19/12 0620  HGB 6.9* 6.0*  HCT 20.7* 18.2*    Assessment/Plan: Discharge home, Social Work consult and Contraception Depo   LOS: 2 days   Lori Johnston 03/20/2012, 7:23 AM

## 2012-03-21 ENCOUNTER — Ambulatory Visit (INDEPENDENT_AMBULATORY_CARE_PROVIDER_SITE_OTHER): Payer: Medicaid Other | Admitting: Obstetrics and Gynecology

## 2012-03-21 VITALS — BP 119/73 | HR 94 | Wt 152.0 lb

## 2012-03-21 DIAGNOSIS — Z3049 Encounter for surveillance of other contraceptives: Secondary | ICD-10-CM

## 2012-03-21 LAB — TYPE AND SCREEN
ABO/RH(D): O POS
Unit division: 0

## 2012-03-21 MED ORDER — MEDROXYPROGESTERONE ACETATE 150 MG/ML IM SUSP
150.0000 mg | Freq: Once | INTRAMUSCULAR | Status: AC
  Administered 2012-03-21: 150 mg via INTRAMUSCULAR

## 2012-04-03 ENCOUNTER — Telehealth: Payer: Self-pay | Admitting: *Deleted

## 2012-04-03 NOTE — Telephone Encounter (Signed)
Reviewed note in chart.
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
## 2012-04-18 ENCOUNTER — Emergency Department (HOSPITAL_COMMUNITY)
Admission: EM | Admit: 2012-04-18 | Discharge: 2012-04-18 | Disposition: A | Discharge status: Home / Self Care | Payer: Medicaid Other | Attending: Emergency Medicine | Admitting: Emergency Medicine

## 2012-04-18 ENCOUNTER — Encounter (HOSPITAL_COMMUNITY): Payer: Self-pay | Admitting: *Deleted

## 2012-04-18 DIAGNOSIS — J0110 Acute frontal sinusitis, unspecified: Secondary | ICD-10-CM | POA: Insufficient documentation

## 2012-04-18 DIAGNOSIS — R0982 Postnasal drip: Secondary | ICD-10-CM | POA: Insufficient documentation

## 2012-04-18 DIAGNOSIS — Z87891 Personal history of nicotine dependence: Secondary | ICD-10-CM | POA: Insufficient documentation

## 2012-04-18 DIAGNOSIS — R51 Headache: Secondary | ICD-10-CM | POA: Insufficient documentation

## 2012-04-18 DIAGNOSIS — L0231 Cutaneous abscess of buttock: Secondary | ICD-10-CM | POA: Insufficient documentation

## 2012-04-18 DIAGNOSIS — Z8709 Personal history of other diseases of the respiratory system: Secondary | ICD-10-CM | POA: Insufficient documentation

## 2012-04-18 DIAGNOSIS — R509 Fever, unspecified: Secondary | ICD-10-CM | POA: Insufficient documentation

## 2012-04-18 DIAGNOSIS — Z8619 Personal history of other infectious and parasitic diseases: Secondary | ICD-10-CM | POA: Insufficient documentation

## 2012-04-18 DIAGNOSIS — J3489 Other specified disorders of nose and nasal sinuses: Secondary | ICD-10-CM | POA: Insufficient documentation

## 2012-04-18 DIAGNOSIS — Z8679 Personal history of other diseases of the circulatory system: Secondary | ICD-10-CM | POA: Insufficient documentation

## 2012-04-18 MED ORDER — AMOXICILLIN 500 MG PO CAPS: 500.0000 mg | ORAL_CAPSULE | Freq: Three times a day (TID) | ORAL | Status: DC

## 2012-04-18 MED ORDER — SULFAMETHOXAZOLE-TRIMETHOPRIM 800-160 MG PO TABS: 1.0000 | ORAL_TABLET | Freq: Two times a day (BID) | ORAL | Status: AC

## 2012-04-18 MED ORDER — FEXOFENADINE-PSEUDOEPHED ER 60-120 MG PO TB12: 1.0000 | ORAL_TABLET | Freq: Two times a day (BID) | ORAL | Status: DC

## 2012-04-18 MED ORDER — KETOROLAC TROMETHAMINE 30 MG/ML IJ SOLN
30.0000 mg | Freq: Once | INTRAMUSCULAR | Status: AC
  Administered 2012-04-18: 30 mg via INTRAMUSCULAR
  Filled 2012-04-18: qty 1

## 2012-04-18 NOTE — ED Notes (Addendum)
Sore throat, headache,nausea  Abscess on buttocks

## 2012-04-18 NOTE — ED Notes (Signed)
Patient with no complaints at this time. Respirations even and unlabored. Skin warm/dry. Discharge instructions reviewed with patient at this time. Patient given opportunity to voice concerns/ask questions. Patient discharged at this time and left Emergency Department with steady gait.   

## 2012-04-18 NOTE — ED Notes (Signed)
During d/c process, pt. Informed me that she has a boil on her R buttock.  Notified J. Idol, PA-c who decided to change antibx.  Patient had made PA-C aware of boil during assessment.

## 2012-04-18 NOTE — ED Provider Notes (Signed)
History     CSN: [id number]  Arrival date & time 04/18/12  1108   First MD Initiated Contact with Patient 04/18/12 1201      Chief Complaint  Patient presents with  . Sore Throat    (Consider location/radiation/quality/duration/timing/severity/associated sxs/prior treatment) HPI Comments: QUINCEY NORED is a 22 y.o. Female with complaints of sore throat, post nasal drip, nasal congestion with thick sometimes bloody green drainage along with right facial and forehead pain.  She also describes subjective fevers, stating she has been waking at night sweating.  She denies cough, chest pain and shortness of breath.  Her symptoms have been present for the past week.  She has taken ibuprofen with mild relief of symptoms.  She has found no other alleviators or aggravators.  Additionally,  She mentions a boil on her right buttock which has been present for the past few days.  The area is tender to palpation with swelling; there has been drainage from the site.  She has tried warm compresses which has started to make it smaller but still not draining. She does not have a history of boils or other skin infections.     The history is provided by the patient.    Past Medical History  Diagnosis Date  . Bronchitis   . Migraine   . Pregnant   . History of chlamydia     Past Surgical History  Procedure Laterality Date  . Wisdom tooth extraction    . Dilation and curettage of uterus N/A 03/18/2012    Procedure: DILATATION AND CURETTAGE;  Surgeon: Catalina Antigua, MD;  Location: [facility];  Service: Gynecology;  Laterality: N/A;    Family History  Problem Relation Age of Onset  . Anesthesia problems Neg Hx   . Hypotension Neg Hx   . Malignant hyperthermia Neg Hx   . Pseudochol deficiency Neg Hx   . Diabetes Cousin     History  Substance Use Topics  . Smoking status: Former Smoker -- 0.20 packs/day for 2 years    Types: Cigarettes    Quit date: 08/12/2010  . Smokeless tobacco: Never Used   . Alcohol Use: No    OB History   Grav Para Term Preterm Abortions TAB SAB Ect Mult Living   2 2 1 1  0 0 0 0 0 2      Review of Systems  Constitutional: Positive for fever.  HENT: Positive for congestion, sore throat, rhinorrhea and sinus pressure. Negative for neck pain.   Eyes: Negative.   Respiratory: Negative for chest tightness and shortness of breath.   Cardiovascular: Negative for chest pain.  Gastrointestinal: Negative for nausea and abdominal pain.  Genitourinary: Negative.   Musculoskeletal: Negative for joint swelling and arthralgias.  Skin: Positive for wound. Negative for rash.  Neurological: Negative for dizziness, weakness, light-headedness, numbness and headaches.  Psychiatric/Behavioral: Negative.     Allergies  Bee venom and Other  Home Medications   Current Outpatient Rx  Name  Route  Sig  Dispense  Refill  . ferrous fumarate (HEMOCYTE - 106 MG FE) 325 (106 FE) MG TABS   Oral   Take 1 tablet (106 mg of iron total) by mouth 3 (three) times daily with meals.   90 each   0   . ibuprofen (ADVIL,MOTRIN) 600 MG tablet   Oral   Take 1 tablet (600 mg total) by mouth every 6 (six) hours.   60 tablet   1   . Prenatal Vit-Fe Fumarate-FA (PRENATAL MULTIVITAMIN)  TABS   Oral   Take 1 tablet by mouth daily at 12 noon.         . fexofenadine-pseudoephedrine (ALLEGRA-D) 60-120 MG per tablet   Oral   Take 1 tablet by mouth every 12 (twelve) hours.   14 tablet   0   . sulfamethoxazole-trimethoprim (BACTRIM DS,SEPTRA DS) 800-160 MG per tablet   Oral   Take 1 tablet by mouth 2 (two) times daily.   20 tablet   0     BP 107/65  Pulse 85  Temp(Src) 98.5 F (36.9 C) (Oral)  Resp 20  Ht 5\' 8"  (1.727 m)  Wt 140 lb (63.504 kg)  BMI 21.29 kg/m2  SpO2 100%  Breastfeeding? No  Physical Exam  Nursing note and vitals reviewed. Constitutional: She appears well-developed and well-nourished.  HENT:  Head: Normocephalic.  Right Ear: Tympanic membrane,  external ear and ear canal normal.  Left Ear: Tympanic membrane, external ear and ear canal normal.  Nose: Mucosal edema and rhinorrhea present. Right sinus exhibits frontal sinus tenderness.  Mouth/Throat: Uvula is midline and oropharynx is clear and moist.  Eyes: Conjunctivae are normal.  Neck: Normal range of motion.  Cardiovascular: Normal rate, regular rhythm, normal heart sounds and intact distal pulses.   Pulmonary/Chest: Effort normal and breath sounds normal. She has no decreased breath sounds. She has no wheezes. She has no rhonchi.  Abdominal: Soft. Bowel sounds are normal. There is no tenderness.  Musculoskeletal: Normal range of motion.  Neurological: She is alert.  Skin: Skin is warm and dry.  0.5 cm indurated,  Raised,  Tender papule right mid buttock. No pustular center.   No fluctuance,  1 cm surrounding erythema.  No red streaking.  Psychiatric: She has a normal mood and affect.    ED Course  Procedures (including critical care time)  Labs Reviewed - No data to display No results found.   1. Sinusitis, acute frontal   2. Abscess of right buttock       MDM  Pt placed on bactrim,  Encouraged to continue warm compresses,  Avoid squeezing the abscess site.  Bactrim prescribed to cover both abscess and suspected sinusitis. Allegra d also prescribed, discussed steam therapy, warm compresses to forehead,  Continued advil for pain relief.   Pt encouraged f/u with pcp for a recheck this week if not improving.   The patient appears reasonably screened and/or stabilized for discharge and I doubt any other medical condition or other [facility] requiring further screening, evaluation, or treatment in the ED at this time prior to discharge.         Burgess Amor, PA-C 04/18/12 2139

## 2012-04-18 NOTE — ED Notes (Signed)
Patient with no complaints at this time. Respirations even and unlabored. Skin warm/dry. Discharge instructions reviewed with patient at this time. Patient given opportunity to voice concerns/ask questions. Patient waiting to be d/c'ed when both her children are d/c'ed.Marland Kitchen

## 2012-04-19 NOTE — ED Provider Notes (Signed)
Medical screening examination/treatment/procedure(s) were performed by non-physician practitioner and as supervising physician I was immediately available for consultation/collaboration.   Amit Meloy M Kailah Pennel, DO 04/19/12 0822 

## 2012-05-06 ENCOUNTER — Ambulatory Visit: Payer: Self-pay | Admitting: Advanced Practice Midwife

## 2012-05-08 ENCOUNTER — Ambulatory Visit: Payer: Self-pay | Admitting: Advanced Practice Midwife

## 2012-05-08 ENCOUNTER — Encounter: Payer: Self-pay | Admitting: *Deleted

## 2012-05-19 ENCOUNTER — Encounter: Payer: Self-pay | Admitting: Women's Health

## 2012-05-19 ENCOUNTER — Ambulatory Visit (INDEPENDENT_AMBULATORY_CARE_PROVIDER_SITE_OTHER): Payer: Medicaid Other | Admitting: Women's Health

## 2012-05-19 VITALS — BP 100/60 | Wt 136.0 lb

## 2012-05-19 DIAGNOSIS — O9081 Anemia of the puerperium: Secondary | ICD-10-CM

## 2012-05-19 DIAGNOSIS — Z309 Encounter for contraceptive management, unspecified: Secondary | ICD-10-CM

## 2012-05-19 DIAGNOSIS — Z3480 Encounter for supervision of other normal pregnancy, unspecified trimester: Secondary | ICD-10-CM

## 2012-05-19 DIAGNOSIS — D649 Anemia, unspecified: Secondary | ICD-10-CM

## 2012-05-19 MED ORDER — MEDROXYPROGESTERONE ACETATE 150 MG/ML IM SUSP: 150.0000 mg | INTRAMUSCULAR | Status: DC

## 2012-05-19 NOTE — Patient Instructions (Signed)

## 2012-05-19 NOTE — Progress Notes (Signed)
Subjective:     Lori Johnston is a 22 y.o. G80P1102 black female who presents for a postpartum visit. She is 2 months postpartum following a spontaneous vaginal delivery w/ D&E/removal of retained placenta in OR. Hgb dropped from 9.1 to 6.0 upon d/c from hospital- has been taking iron as rx'd. Received 1st depo shot on 3/21 upon d/c from hospital. Would like to continue. I have fully reviewed the prenatal and intrapartum course. The delivery was at 35 gestational weeks d/t SROM and SOL . Outcome: spontaneous vaginal delivery. Anesthesia: IV pain meds. Postpartum course has been uneventful. Baby's course has been uneventful. Baby is feeding by bottle. Bleeding no bleeding. Bowel function is normal. Bladder function is normal. Patient is sexually active. Contraception method is Depo-Provera injections. Postpartum depression screening: negative.  The following portions of the patient's history were reviewed and updated as appropriate: allergies, current medications, past family history, past medical history, past social history, past surgical history and problem list.  Review of Systems Pertinent items are noted in HPI.   Objective:    BP 100/60  Wt 136 lb (61.689 kg)  BMI 20.68 kg/m2  LMP 04/13/2012  Breastfeeding? No  General:  alert, cooperative and no distress   Breasts:  deferred            Vulva:  normal  Vagina: normal vagina  Cervix:  normal  Corpus: well-involuted  Adnexa:  not evaluated  Rectal Exam: Not performed.       Hgb fingerstick today 10.9 Assessment:     Normal postpartum exam.  2months s/p PTB @ 35wks,  Negative depression screening Anemia Contraception management  Plan:    1. Contraception: Depo-Provera injections 2. Continue iron supplementation 3. F/U around 6/9 for Depo injection 4. Rx Depo Provera 150mg  w/ 12RF e-scribed

## 2012-06-09 ENCOUNTER — Encounter: Payer: Self-pay | Admitting: Obstetrics & Gynecology

## 2012-06-09 ENCOUNTER — Ambulatory Visit (INDEPENDENT_AMBULATORY_CARE_PROVIDER_SITE_OTHER): Payer: Medicaid Other | Admitting: Obstetrics & Gynecology

## 2012-06-09 VITALS — BP 110/70 | Ht 68.0 in | Wt 137.0 lb

## 2012-06-09 DIAGNOSIS — Z309 Encounter for contraceptive management, unspecified: Secondary | ICD-10-CM

## 2012-06-09 DIAGNOSIS — Z3202 Encounter for pregnancy test, result negative: Secondary | ICD-10-CM

## 2012-06-09 DIAGNOSIS — Z3200 Encounter for pregnancy test, result unknown: Secondary | ICD-10-CM

## 2012-06-09 DIAGNOSIS — Z3049 Encounter for surveillance of other contraceptives: Secondary | ICD-10-CM

## 2012-06-09 MED ORDER — MEDROXYPROGESTERONE ACETATE 150 MG/ML IM SUSP
150.0000 mg | Freq: Once | INTRAMUSCULAR | Status: AC
  Administered 2012-06-09: 150 mg via INTRAMUSCULAR

## 2012-08-04 ENCOUNTER — Encounter (HOSPITAL_COMMUNITY): Payer: Self-pay

## 2012-08-04 ENCOUNTER — Emergency Department (HOSPITAL_COMMUNITY)
Admission: EM | Admit: 2012-08-04 | Discharge: 2012-08-04 | Disposition: A | Discharge status: Home / Self Care | Payer: Medicaid Other | Attending: Emergency Medicine | Admitting: Emergency Medicine

## 2012-08-04 DIAGNOSIS — H53149 Visual discomfort, unspecified: Secondary | ICD-10-CM | POA: Insufficient documentation

## 2012-08-04 DIAGNOSIS — Z8619 Personal history of other infectious and parasitic diseases: Secondary | ICD-10-CM | POA: Insufficient documentation

## 2012-08-04 DIAGNOSIS — Z8709 Personal history of other diseases of the respiratory system: Secondary | ICD-10-CM | POA: Insufficient documentation

## 2012-08-04 DIAGNOSIS — Z87891 Personal history of nicotine dependence: Secondary | ICD-10-CM | POA: Insufficient documentation

## 2012-08-04 DIAGNOSIS — R110 Nausea: Secondary | ICD-10-CM | POA: Insufficient documentation

## 2012-08-04 DIAGNOSIS — Z79899 Other long term (current) drug therapy: Secondary | ICD-10-CM | POA: Insufficient documentation

## 2012-08-04 DIAGNOSIS — G43909 Migraine, unspecified, not intractable, without status migrainosus: Secondary | ICD-10-CM | POA: Insufficient documentation

## 2012-08-04 MED ORDER — METOCLOPRAMIDE HCL 5 MG/ML IJ SOLN
10.0000 mg | Freq: Once | INTRAMUSCULAR | Status: AC
  Administered 2012-08-04: 10 mg via INTRAVENOUS
  Filled 2012-08-04: qty 2

## 2012-08-04 NOTE — ED Notes (Signed)
Patient to room with child. Child noted to run around room, crawling on furniture and running into door. Mother noted to be watching TV.

## 2012-08-04 NOTE — ED Notes (Signed)
Pt c/o migraine for 13 days straight with loss of appetite and nausea.

## 2012-08-04 NOTE — ED Provider Notes (Signed)
CSN: [id number]     Arrival date & time 08/04/12  1629 History  This chart was scribed for Doug Sou, MD by Bennett Scrape, ED Scribe. This patient was seen in room APA18/APA18 and the patient's care was started at 5:10 PM.   First MD Initiated Contact with Patient 08/04/12 1701     Chief Complaint  Patient presents with  . Migraine    Patient is a 22 y.o. female presenting with migraines. The history is provided by the patient. No language interpreter was used.  Migraine This is a recurrent problem. The current episode started more than 1 week ago. The problem occurs daily. The problem has not changed since onset.Associated symptoms include headaches.    HPI Comments: Lori Johnston is a 22 y.o. female with a h/o migraines for years who presents to the Emergency Department complaining of 13 days of intermittent HA. Pt states that the HA has been located on the left temple and right temple since the onset. She rates her pain a 9 out of 10 but states that this is less intense than child birth. The HA is worsened by bright lights and loud sounds. She reports associated nausea and photophobia. She states that she has been taking 600 mg ibuprofen with mild improvement. Last HA of similar intensity was 2 years ago. Headache gradual in onset throbbing in nature typical migraine she's had in the past She also reports having a family h/o migraines. LNMP was 2 days ago.   Past Medical History  Diagnosis Date  . Bronchitis   . Migraine   . Pregnant   . History of chlamydia    Past Surgical History  Procedure Laterality Date  . Wisdom tooth extraction    . Dilation and curettage of uterus N/A 03/18/2012    Procedure: DILATATION AND CURETTAGE;  Surgeon: Catalina Antigua, MD;  Location: [facility];  Service: Gynecology;  Laterality: N/A;   Family History  Problem Relation Age of Onset  . Anesthesia problems Neg Hx   . Hypotension Neg Hx   . Malignant hyperthermia Neg Hx   . Pseudochol  deficiency Neg Hx   . Diabetes Cousin    History  Substance Use Topics  . Smoking status: Former Smoker -- 0.20 packs/day for 2 years    Types: Cigarettes    Quit date: 08/12/2010  . Smokeless tobacco: Never Used  . Alcohol Use: No   OB History   Grav Para Term Preterm Abortions TAB SAB Ect Mult Living   2 2 1 1  0 0 0 0 0 2     Review of Systems  Constitutional: Negative.   Eyes: Positive for photophobia.  Respiratory: Negative.   Cardiovascular: Negative.   Gastrointestinal: Positive for nausea.  Musculoskeletal: Negative.   Skin: Negative.   Neurological: Positive for headaches.  Psychiatric/Behavioral: Negative.     Allergies  Bee venom and Other  Home Medications   Current Outpatient Rx  Name  Route  Sig  Dispense  Refill  . ferrous fumarate (HEMOCYTE - 106 MG FE) 325 (106 FE) MG TABS   Oral   Take 1 tablet (106 mg of iron total) by mouth 3 (three) times daily with meals.   90 each   0   . fexofenadine-pseudoephedrine (ALLEGRA-D) 60-120 MG per tablet   Oral   Take 1 tablet by mouth every 12 (twelve) hours.   14 tablet   0   . ibuprofen (ADVIL,MOTRIN) 600 MG tablet   Oral   Take 1  tablet (600 mg total) by mouth every 6 (six) hours.   60 tablet   1   . IRON PO   Oral   Take by mouth.         . medroxyPROGESTERone (DEPO-PROVERA) 150 MG/ML injection   Intramuscular   Inject 1 mL (150 mg total) into the muscle every 3 (three) months.   1 mL   12   . Prenatal Vit-Fe Fumarate-FA (PRENATAL MULTIVITAMIN) TABS   Oral   Take 1 tablet by mouth daily at 12 noon.          Triage Vitals: BP 113/63  Pulse 75  Temp(Src) 98.4 F (36.9 C) (Oral)  Resp 20  Ht 5\' 8"  (1.727 m)  Wt 136 lb (61.689 kg)  BMI 20.68 kg/m2  SpO2 100%  LMP 08/02/2012  Physical Exam  Nursing note and vitals reviewed. Constitutional: She is oriented to person, place, and time. She appears well-developed and well-nourished.  HENT:  Head: Normocephalic and atraumatic.  Eyes:  Conjunctivae are normal. Pupils are equal, round, and reactive to light.  Neck: Neck supple. No tracheal deviation present. No thyromegaly present.  Cardiovascular: Normal rate and regular rhythm.   No murmur heard. Pulmonary/Chest: Effort normal and breath sounds normal.  Abdominal: Soft. Bowel sounds are normal. She exhibits no distension. There is no tenderness.  Musculoskeletal: Normal range of motion. She exhibits no edema and no tenderness.  Neurological: She is alert and oriented to person, place, and time. She has normal reflexes. Coordination normal.  Normal gait, negative pronator's drift Romberg normal  Skin: Skin is warm and dry. No rash noted.  Psychiatric: She has a normal mood and affect.    ED Course   Procedures (including critical care time)  Medications  metoCLOPramide (REGLAN) injection 10 mg (not administered)    DIAGNOSTIC STUDIES: Oxygen Saturation is 100% on room air, normal by my interpretation.    COORDINATION OF CARE: 5:13 PM-Discussed treatment plan which includes Reglan with pt at bedside and pt agreed to plan.   Labs Reviewed - No data to display No results found. No diagnosis found. 6:15 PM patient is asymptomatic and headache is gone after treatment with intravenous Reglan. She is alert appropriate Glasgow Coma Score 15. MDM  Plan followup with primary care physician Diagnosis migraine headache   I personally performed the services described in this documentation, which was scribed in my presence. The recorded information has been reviewed and considered.   Doug Sou, MD 08/04/12 [name]

## 2012-08-04 NOTE — ED Notes (Signed)
Patient with no complaints at this time. Respirations even and unlabored. Skin warm/dry. Discharge instructions reviewed with patient at this time. Patient given opportunity to voice concerns/ask questions. IV removed per policy and band-aid applied to site. Patient discharged at this time and left Emergency Department with steady gait.  

## 2012-08-12 ENCOUNTER — Ambulatory Visit: Payer: Medicaid Other | Admitting: Adult Health

## 2012-08-14 ENCOUNTER — Encounter: Payer: Self-pay | Admitting: Obstetrics and Gynecology

## 2012-08-14 ENCOUNTER — Ambulatory Visit (INDEPENDENT_AMBULATORY_CARE_PROVIDER_SITE_OTHER): Payer: Medicaid Other | Admitting: Obstetrics and Gynecology

## 2012-08-14 VITALS — BP 112/60 | Ht 68.0 in | Wt 132.8 lb

## 2012-08-14 DIAGNOSIS — K6289 Other specified diseases of anus and rectum: Secondary | ICD-10-CM | POA: Insufficient documentation

## 2012-08-14 DIAGNOSIS — N949 Unspecified condition associated with female genital organs and menstrual cycle: Secondary | ICD-10-CM

## 2012-08-14 MED ORDER — POLYETHYLENE GLYCOL 3350 17 GM/SCOOP PO POWD: 17.0000 g | Freq: Every day | ORAL | Status: DC

## 2012-08-14 MED ORDER — MEGESTROL ACETATE 40 MG PO TABS: 40.0000 mg | ORAL_TABLET | Freq: Every day | ORAL | Status: DC

## 2012-08-14 NOTE — Patient Instructions (Addendum)
Use miralax daily Anal Fissure, Adult An anal fissure is a small tear or crack in the skin around the anus. Bleeding from a fissure usually stops on its own within a few minutes. However, bleeding will often reoccur with each bowel movement until the crack heals.  CAUSES   Passing large, hard stools.  Frequent diarrheal stools.  Constipation.  Inflammatory bowel disease (Crohn's disease or ulcerative colitis).  Infections.  Anal sex. SYMPTOMS   Small amounts of blood seen on your stools, on toilet paper, or in the toilet after a bowel movement.  Rectal bleeding.  Painful bowel movements.  Itching or irritation around the anus. DIAGNOSIS Your caregiver will examine the anal area. An anal fissure can usually be seen with careful inspection. A rectal exam may be performed and a short tube (anoscope) may be used to examine the anal canal. TREATMENT   You may be instructed to take fiber supplements. These supplements can soften your stool to help make bowel movements easier.  Sitz baths may be recommended to help heal the tear. Do not use soap in the sitz baths.  A medicated cream or ointment may be prescribed to lessen discomfort. HOME CARE INSTRUCTIONS   Maintain a diet high in fruits, whole grains, and vegetables. Avoid constipating foods like bananas and dairy products.  Take sitz baths as directed by your caregiver.  Drink enough fluids to keep your urine clear or pale yellow.  Only take over-the-counter or prescription medicines for pain, discomfort, or fever as directed by your caregiver. Do not take aspirin as this may increase bleeding.  Do not use ointments containing numbing medications (anesthetics) or hydrocortisone. They could slow healing. SEEK MEDICAL CARE IF:   Your fissure is not completely healed within 3 days.  You have further bleeding.  You have a fever.  You have diarrhea mixed with blood.  You have pain.  Your problem is getting worse  rather than better. MAKE SURE YOU:   Understand these instructions.  Will watch your condition.  Will get help right away if you are not doing well or get worse. Document Released: 12/18/2004 Document Revised: 03/12/2011 Document Reviewed: 06/04/2010 [facility] Patient Information 2014 [place], [place].

## 2012-08-14 NOTE — Progress Notes (Signed)
Patient ID: Lori Johnston, female   DOB: 25-Mar-1990, 22 y.o.   MRN: [id number] Pt c/o anal pain and abnormal vaginal bleeding. Pt on depo provera for contraceptives.   [facility] Visit  Patient name: Lori Johnston MRN [id number]  Date of birth: 20-Nov-1990  CC & HPI:  Lori Johnston is a 22 y.o. female presenting today for pain on left of anus with bm, no bleeding, noted x 2 wk. Also c/o daily BTB on depoProvera  ROS:  -  Pertinent History Reviewed:  Medical & Surgical Hx:  Reviewed: Significant for - Medications: Reviewed & Updated - see associated section Social History: Reviewed -  reports that she quit smoking about 2 years ago. Her smoking use included Cigarettes. She has a .4 pack-year smoking history. She has never used smokeless tobacco.  Objective Findings:  Vitals: BP 112/60  Ht 5\' 8"  (1.727 m)  Wt 132 lb 12.8 oz (60.238 kg)  BMI 20.2 kg/m2  LMP 08/02/2012  Physical Examination: Eyes - pupils equal and reactive, extraocular eye movements intact Abdomen - soft, nontender, nondistended, no masses or organomegaly Pelvic - VULVA: normal appearing vulva with no masses, tenderness or lesions, RECTAL: normal rectal, no masses, fissure tender on left.   Assessment & Plan:   Anal fissure BTB on dEpo Rx Megace qd prn bleeding  Rx miralax

## 2012-09-02 ENCOUNTER — Ambulatory Visit: Payer: Medicaid Other

## 2012-09-02 ENCOUNTER — Ambulatory Visit (INDEPENDENT_AMBULATORY_CARE_PROVIDER_SITE_OTHER): Payer: Medicaid Other | Admitting: Adult Health

## 2012-09-02 DIAGNOSIS — Z3049 Encounter for surveillance of other contraceptives: Secondary | ICD-10-CM

## 2012-09-02 DIAGNOSIS — Z3202 Encounter for pregnancy test, result negative: Secondary | ICD-10-CM

## 2012-09-02 MED ORDER — MEDROXYPROGESTERONE ACETATE 150 MG/ML IM SUSP: 150.0000 mg | Freq: Once | INTRAMUSCULAR | Status: DC

## 2012-09-03 ENCOUNTER — Encounter: Payer: Self-pay | Admitting: Adult Health

## 2012-09-03 ENCOUNTER — Ambulatory Visit: Payer: Medicaid Other

## 2012-09-03 ENCOUNTER — Ambulatory Visit (INDEPENDENT_AMBULATORY_CARE_PROVIDER_SITE_OTHER): Payer: Medicaid Other | Admitting: Adult Health

## 2012-09-03 VITALS — BP 118/68 | Ht 68.0 in | Wt 136.0 lb

## 2012-09-03 DIAGNOSIS — Z3049 Encounter for surveillance of other contraceptives: Secondary | ICD-10-CM

## 2012-09-03 DIAGNOSIS — Z309 Encounter for contraceptive management, unspecified: Secondary | ICD-10-CM

## 2012-09-03 DIAGNOSIS — Z3202 Encounter for pregnancy test, result negative: Secondary | ICD-10-CM

## 2012-09-03 LAB — POCT URINE PREGNANCY
Preg Test, Ur: NEGATIVE
Preg Test, Ur: NEGATIVE

## 2012-09-03 MED ORDER — MEDROXYPROGESTERONE ACETATE 150 MG/ML IM SUSP
150.0000 mg | Freq: Once | INTRAMUSCULAR | Status: AC
  Administered 2012-09-03: 150 mg via INTRAMUSCULAR

## 2012-09-22 ENCOUNTER — Encounter (HOSPITAL_COMMUNITY): Payer: Self-pay | Admitting: *Deleted

## 2012-09-22 ENCOUNTER — Emergency Department (HOSPITAL_COMMUNITY)
Admission: EM | Admit: 2012-09-22 | Discharge: 2012-09-22 | Discharge status: Against Medical Advice | Payer: Medicaid Other | Attending: Emergency Medicine | Admitting: Emergency Medicine

## 2012-09-22 ENCOUNTER — Emergency Department (HOSPITAL_COMMUNITY): Payer: Medicaid Other

## 2012-09-22 DIAGNOSIS — R05 Cough: Secondary | ICD-10-CM | POA: Insufficient documentation

## 2012-09-22 DIAGNOSIS — R0602 Shortness of breath: Secondary | ICD-10-CM | POA: Insufficient documentation

## 2012-09-22 DIAGNOSIS — R059 Cough, unspecified: Secondary | ICD-10-CM | POA: Insufficient documentation

## 2012-09-22 NOTE — ED Notes (Signed)
Sudden onset cough, sob,  Pt was outside. Pushing a stroller , had spo2 of 88, EMS gave her HHN tx and feels better.

## 2012-11-13 ENCOUNTER — Other Ambulatory Visit (HOSPITAL_COMMUNITY): Payer: Self-pay | Admitting: Family Medicine

## 2012-11-13 DIAGNOSIS — R109 Unspecified abdominal pain: Secondary | ICD-10-CM

## 2012-11-17 ENCOUNTER — Ambulatory Visit (HOSPITAL_COMMUNITY)
Admission: RE | Admit: 2012-11-17 | Discharge: 2012-11-17 | Disposition: A | Discharge status: Home / Self Care | Payer: Medicaid Other | Source: Ambulatory Visit | Attending: Family Medicine | Admitting: Family Medicine

## 2012-11-17 DIAGNOSIS — R109 Unspecified abdominal pain: Secondary | ICD-10-CM | POA: Insufficient documentation

## 2012-11-26 ENCOUNTER — Ambulatory Visit (INDEPENDENT_AMBULATORY_CARE_PROVIDER_SITE_OTHER): Payer: Medicaid Other | Admitting: Adult Health

## 2012-11-26 ENCOUNTER — Encounter: Payer: Self-pay | Admitting: Adult Health

## 2012-11-26 VITALS — BP 102/58 | Ht 68.0 in | Wt 136.5 lb

## 2012-11-26 DIAGNOSIS — Z3049 Encounter for surveillance of other contraceptives: Secondary | ICD-10-CM

## 2012-11-26 DIAGNOSIS — Z3202 Encounter for pregnancy test, result negative: Secondary | ICD-10-CM

## 2012-11-26 LAB — POCT URINE PREGNANCY: Preg Test, Ur: NEGATIVE

## 2012-11-26 MED ORDER — MEDROXYPROGESTERONE ACETATE 150 MG/ML IM SUSP
150.0000 mg | Freq: Once | INTRAMUSCULAR | Status: AC
  Administered 2012-11-26: 150 mg via INTRAMUSCULAR

## 2012-11-26 NOTE — Progress Notes (Signed)
Pt here for Depo Injection. Complains of stomach pain with intercourse. Advised to schedule an appt with one of the providers.

## 2012-12-04 ENCOUNTER — Ambulatory Visit: Payer: Medicaid Other | Admitting: Adult Health

## 2012-12-10 ENCOUNTER — Ambulatory Visit (INDEPENDENT_AMBULATORY_CARE_PROVIDER_SITE_OTHER): Payer: Medicaid Other | Admitting: Adult Health

## 2012-12-10 ENCOUNTER — Encounter: Payer: Self-pay | Admitting: Adult Health

## 2012-12-10 VITALS — BP 110/64 | Ht 68.0 in | Wt 133.0 lb

## 2012-12-10 DIAGNOSIS — A499 Bacterial infection, unspecified: Secondary | ICD-10-CM

## 2012-12-10 DIAGNOSIS — IMO0002 Reserved for concepts with insufficient information to code with codable children: Secondary | ICD-10-CM

## 2012-12-10 DIAGNOSIS — N898 Other specified noninflammatory disorders of vagina: Secondary | ICD-10-CM

## 2012-12-10 DIAGNOSIS — N760 Acute vaginitis: Secondary | ICD-10-CM

## 2012-12-10 DIAGNOSIS — B9689 Other specified bacterial agents as the cause of diseases classified elsewhere: Secondary | ICD-10-CM

## 2012-12-10 HISTORY — DX: Other specified bacterial agents as the cause of diseases classified elsewhere: B96.89

## 2012-12-10 HISTORY — DX: Reserved for concepts with insufficient information to code with codable children: IMO0002

## 2012-12-10 HISTORY — DX: Other specified noninflammatory disorders of vagina: N89.8

## 2012-12-10 LAB — POCT WET PREP (WET MOUNT)

## 2012-12-10 MED ORDER — METRONIDAZOLE 500 MG PO TABS: 500.0000 mg | ORAL_TABLET | Freq: Two times a day (BID) | ORAL | Status: DC

## 2012-12-10 NOTE — Progress Notes (Signed)
Subjective:     Patient ID: Lori Johnston, female   DOB: 08/28/1990, 22 y.o.   MRN: [id number]  HPI Ruth is a 71o year old black female in complaining of pain with sex, is using depo.  Review of Systems See HPI Reviewed past medical,surgical, social and family history. Reviewed medications and allergies.     Objective:   Physical Exam BP 110/64  Ht 5\' 8"  (1.727 m)  Wt 133 lb (60.328 kg)  BMI 20.23 kg/m2   Skin warm and dry.Pelvic: external genitalia is normal in appearance, vagina: white discharge with odor, cervix:smooth and bulbous, uterus: normal size, shape and contour, non tender, no masses felt, adnexa: no masses or tenderness noted. Wet prep: + for clue cells and +WBCs. GC/CHL obtained. Has stool in rectal vault and when hits top of vagina pain like with sex. Assessment:     Vaginal discharge BV Dyspareunia     Plan:     Rx flagyl 500 mg 1 bid x 7 days, no alcohol, review handout on BV   No sex during treatment Change positions with sex and use pillow under hips   Follow up prn

## 2012-12-10 NOTE — Patient Instructions (Signed)
Bacterial Vaginosis Bacterial vaginosis (BV) is a vaginal infection where the normal balance of bacteria in the vagina is disrupted. The normal balance is then replaced by an overgrowth of certain bacteria. There are several different kinds of bacteria that can cause BV. BV is the most common vaginal infection in women of childbearing age. CAUSES   The cause of BV is not fully understood. BV develops when there is an increase or imbalance of harmful bacteria.  Some activities or behaviors can upset the normal balance of bacteria in the vagina and put women at increased risk including:  Having a new sex partner or multiple sex partners.  Douching.  Using an intrauterine device (IUD) for contraception.  It is not clear what role sexual activity plays in the development of BV. However, women that have never had sexual intercourse are rarely infected with BV. Women do not get BV from toilet seats, bedding, swimming pools or from touching objects around them.  SYMPTOMS   Grey vaginal discharge.  A fish-like odor with discharge, especially after sexual intercourse.  Itching or burning of the vagina and vulva.  Burning or pain with urination.  Some women have no signs or symptoms at all. DIAGNOSIS  Your caregiver must examine the vagina for signs of BV. Your caregiver will perform lab tests and look at the sample of vaginal fluid through a microscope. They will look for bacteria and abnormal cells (clue cells), a pH test higher than 4.5, and a positive amine test all associated with BV.  RISKS AND COMPLICATIONS   Pelvic inflammatory disease (PID).  Infections following gynecology surgery.  Developing HIV.  Developing herpes virus. TREATMENT  Sometimes BV will clear up without treatment. However, all women with symptoms of BV should be treated to avoid complications, especially if gynecology surgery is planned. Female partners generally do not need to be treated. However, BV may spread  between female sex partners so treatment is helpful in preventing a recurrence of BV.   BV may be treated with antibiotics. The antibiotics come in either pill or vaginal cream forms. Either can be used with nonpregnant or pregnant women, but the recommended dosages differ. These antibiotics are not harmful to the baby.  BV can recur after treatment. If this happens, a second round of antibiotics will often be prescribed.  Treatment is important for pregnant women. If not treated, BV can cause a premature delivery, especially for a pregnant woman who had a premature birth in the past. All pregnant women who have symptoms of BV should be checked and treated.  For chronic reoccurrence of BV, treatment with a type of prescribed gel vaginally twice a week is helpful. HOME CARE INSTRUCTIONS   Finish all medication as directed by your caregiver.  Do not have sex until treatment is completed.  Tell your sexual partner that you have a vaginal infection. They should see their caregiver and be treated if they have problems, such as a mild rash or itching.  Practice safe sex. Use condoms. Only have 1 sex partner. PREVENTION  Basic prevention steps can help reduce the risk of upsetting the natural balance of bacteria in the vagina and developing BV:  Do not have sexual intercourse (be abstinent).  Do not douche.  Use all of the medicine prescribed for treatment of BV, even if the signs and symptoms go away.  Tell your sex partner if you have BV. That way, they can be treated, if needed, to prevent reoccurrence. SEEK MEDICAL CARE IF:     Your symptoms are not improving after 3 days of treatment.  You have increased discharge, pain, or fever. MAKE SURE YOU:   Understand these instructions.  Will watch your condition.  Will get help right away if you are not doing well or get worse. FOR MORE INFORMATION  Division of STD Prevention (DSTDP), Centers for Disease Control and Prevention:  [employer] American Social Health Association (ASHA): www.ashastd.org  Document Released: 12/18/2004 Document Revised: 03/12/2011 Document Reviewed: 07/30/2012 [facility] Patient Information 2014 [place], [place]. No sex or alcohol during treatment Change positions with sex

## 2012-12-22 ENCOUNTER — Telehealth: Payer: Self-pay | Admitting: Adult Health

## 2012-12-22 NOTE — Telephone Encounter (Signed)
Pt informed of negative GC/CHL from 12/10/12.

## 2012-12-24 ENCOUNTER — Encounter (HOSPITAL_COMMUNITY): Payer: Self-pay | Admitting: Emergency Medicine

## 2012-12-24 ENCOUNTER — Emergency Department (HOSPITAL_COMMUNITY)
Admission: EM | Admit: 2012-12-24 | Discharge: 2012-12-24 | Disposition: A | Discharge status: Home / Self Care | Payer: Medicaid Other | Attending: Emergency Medicine | Admitting: Emergency Medicine

## 2012-12-24 DIAGNOSIS — K2970 Gastritis, unspecified, without bleeding: Secondary | ICD-10-CM | POA: Insufficient documentation

## 2012-12-24 DIAGNOSIS — Z87440 Personal history of urinary (tract) infections: Secondary | ICD-10-CM | POA: Insufficient documentation

## 2012-12-24 DIAGNOSIS — K319 Disease of stomach and duodenum, unspecified: Secondary | ICD-10-CM | POA: Insufficient documentation

## 2012-12-24 DIAGNOSIS — R109 Unspecified abdominal pain: Secondary | ICD-10-CM

## 2012-12-24 DIAGNOSIS — Z8709 Personal history of other diseases of the respiratory system: Secondary | ICD-10-CM | POA: Insufficient documentation

## 2012-12-24 DIAGNOSIS — Z8742 Personal history of other diseases of the female genital tract: Secondary | ICD-10-CM | POA: Insufficient documentation

## 2012-12-24 DIAGNOSIS — Z792 Long term (current) use of antibiotics: Secondary | ICD-10-CM | POA: Insufficient documentation

## 2012-12-24 DIAGNOSIS — J029 Acute pharyngitis, unspecified: Secondary | ICD-10-CM | POA: Insufficient documentation

## 2012-12-24 DIAGNOSIS — Z79899 Other long term (current) drug therapy: Secondary | ICD-10-CM | POA: Insufficient documentation

## 2012-12-24 DIAGNOSIS — F17200 Nicotine dependence, unspecified, uncomplicated: Secondary | ICD-10-CM | POA: Insufficient documentation

## 2012-12-24 DIAGNOSIS — Z8679 Personal history of other diseases of the circulatory system: Secondary | ICD-10-CM | POA: Insufficient documentation

## 2012-12-24 DIAGNOSIS — K279 Peptic ulcer, site unspecified, unspecified as acute or chronic, without hemorrhage or perforation: Secondary | ICD-10-CM

## 2012-12-24 DIAGNOSIS — Z8619 Personal history of other infectious and parasitic diseases: Secondary | ICD-10-CM | POA: Insufficient documentation

## 2012-12-24 LAB — COMPREHENSIVE METABOLIC PANEL
AST: 26 U/L (ref 0–37)
Albumin: 3.8 g/dL (ref 3.5–5.2)
Alkaline Phosphatase: 48 U/L (ref 39–117)
Chloride: 104 mEq/L (ref 96–112)
Potassium: 3.8 mEq/L (ref 3.5–5.1)
Sodium: 142 mEq/L (ref 135–145)
Total Bilirubin: 0.5 mg/dL (ref 0.3–1.2)
Total Protein: 7 g/dL (ref 6.0–8.3)

## 2012-12-24 LAB — CBC WITH DIFFERENTIAL/PLATELET
Basophils Absolute: 0 10*3/uL (ref 0.0–0.1)
Basophils Relative: 0 % (ref 0–1)
Eosinophils Absolute: 0.1 10*3/uL (ref 0.0–0.7)
Hemoglobin: 11.9 g/dL — ABNORMAL LOW (ref 12.0–15.0)
MCHC: 32.4 g/dL (ref 30.0–36.0)
Monocytes Relative: 11 % (ref 3–12)
Neutro Abs: 4 10*3/uL (ref 1.7–7.7)
Neutrophils Relative %: 67 % (ref 43–77)
Platelets: 198 10*3/uL (ref 150–400)
RDW: 14.8 % (ref 11.5–15.5)

## 2012-12-24 MED ORDER — ONDANSETRON 4 MG PO TBDP: 4.0000 mg | ORAL_TABLET | Freq: Three times a day (TID) | ORAL | Status: DC | PRN

## 2012-12-24 MED ORDER — TRAMADOL HCL 50 MG PO TABS: 50.0000 mg | ORAL_TABLET | Freq: Four times a day (QID) | ORAL | Status: DC | PRN

## 2012-12-24 MED ORDER — PANTOPRAZOLE SODIUM 40 MG PO TBEC
40.0000 mg | DELAYED_RELEASE_TABLET | Freq: Once | ORAL | Status: AC
  Administered 2012-12-24: 40 mg via ORAL
  Filled 2012-12-24: qty 1

## 2012-12-24 MED ORDER — OMEPRAZOLE 20 MG PO CPDR: 20.0000 mg | DELAYED_RELEASE_CAPSULE | Freq: Two times a day (BID) | ORAL | Status: DC

## 2012-12-24 MED ORDER — ONDANSETRON 4 MG PO TBDP
4.0000 mg | ORAL_TABLET | Freq: Once | ORAL | Status: AC
  Administered 2012-12-24: 4 mg via ORAL
  Filled 2012-12-24: qty 1

## 2012-12-24 MED ORDER — HYDROCODONE-ACETAMINOPHEN 5-325 MG PO TABS
1.0000 | ORAL_TABLET | Freq: Once | ORAL | Status: AC
  Administered 2012-12-24: 1 via ORAL
  Filled 2012-12-24: qty 1

## 2012-12-24 NOTE — ED Provider Notes (Signed)
CSN: [id number]     Arrival date & time 12/24/12  0507 History   First MD Initiated Contact with Patient 12/24/12 [phone]     Chief Complaint  Patient presents with  . Abdominal Pain  . Emesis    HPI  Patient presents with 4 days of epigastric pain. Approximately one week ago she had a cough and sore throat. No fever. This resolved for a day or 2, and she was on a normal diet. She developed some nausea and some epigastric pain. Had about one episode of vomiting per day for 3 days. No vomiting yesterday but her nausea persists. She feels full very quickly. She wakes up in the morning with nausea and pain. She presents here. No blood or emesis. No dark or black stools. No fevers. Has not tried any activities at home. No other exacerbating or alleviating factors.  Past Medical History  Diagnosis Date  . Bronchitis   . Migraine   . History of chlamydia   . Vaginal discharge 12/10/2012  . BV (bacterial vaginosis) 12/10/2012  . Dyspareunia 12/10/2012   Past Surgical History  Procedure Laterality Date  . Wisdom tooth extraction    . Dilation and curettage of uterus N/A 03/18/2012    Procedure: DILATATION AND CURETTAGE;  Surgeon: Catalina Antigua, MD;  Location: [facility];  Service: Gynecology;  Laterality: N/A;   Family History  Problem Relation Age of Onset  . Anesthesia problems Neg Hx   . Hypotension Neg Hx   . Malignant hyperthermia Neg Hx   . Pseudochol deficiency Neg Hx   . Diabetes Cousin    History  Substance Use Topics  . Smoking status: Current Every Day Smoker -- 0.20 packs/day for 2 years    Types: Cigarettes    Last Attempt to Quit: 08/12/2010  . Smokeless tobacco: Never Used  . Alcohol Use: Yes     Comment: occ   OB History   Grav Para Term Preterm Abortions TAB SAB Ect Mult Living   2 2 1 1  0 0 0 0 0 2     Review of Systems  Constitutional: Negative for fever, chills, diaphoresis, appetite change and fatigue.  HENT: Positive for sore throat. Negative for mouth sores  and trouble swallowing.   Eyes: Negative for visual disturbance.  Respiratory: Negative for cough, chest tightness, shortness of breath and wheezing.   Cardiovascular: Negative for chest pain.  Gastrointestinal: Positive for nausea, vomiting and abdominal pain. Negative for diarrhea and abdominal distention.  Endocrine: Negative for polydipsia, polyphagia and polyuria.  Genitourinary: Negative for dysuria, frequency and hematuria.  Musculoskeletal: Negative for gait problem.  Skin: Negative for color change, pallor and rash.  Neurological: Negative for dizziness, syncope, light-headedness and headaches.  Hematological: Does not bruise/bleed easily.  Psychiatric/Behavioral: Negative for behavioral problems and confusion.    Allergies  Bee venom and Other  Home Medications   Current Outpatient Rx  Name  Route  Sig  Dispense  Refill  . ibuprofen (ADVIL,MOTRIN) 600 MG tablet   Oral   Take 600 mg by mouth as needed.         . medroxyPROGESTERone (DEPO-PROVERA) 150 MG/ML injection   Intramuscular   Inject 1 mL (150 mg total) into the muscle every 3 (three) months.   1 mL   12   . megestrol (MEGACE) 40 MG tablet   Oral   Take 1 tablet (40 mg total) by mouth daily.   30 tablet   2   . metroNIDAZOLE (FLAGYL) 500  MG tablet   Oral   Take 1 tablet (500 mg total) by mouth 2 (two) times daily.   14 tablet   0   . omeprazole (PRILOSEC) 20 MG capsule   Oral   Take 1 capsule (20 mg total) by mouth 2 (two) times daily.   60 capsule   0   . ondansetron (ZOFRAN ODT) 4 MG disintegrating tablet   Oral   Take 1 tablet (4 mg total) by mouth every 8 (eight) hours as needed for nausea.   20 tablet   0   . polyethylene glycol powder (GLYCOLAX/MIRALAX) powder   Oral   Take 17 g by mouth daily.   255 g   0   . traMADol (ULTRAM) 50 MG tablet   Oral   Take 1 tablet (50 mg total) by mouth every 6 (six) hours as needed.   15 tablet   0    BP 107/58  Pulse 81  Temp(Src) 98.1 F  (36.7 C) (Oral)  Resp 17  Ht 5\' 8"  (1.727 m)  Wt 135 lb (61.236 kg)  BMI 20.53 kg/m2  SpO2 100% Physical Exam  Constitutional: She is oriented to person, place, and time. She appears well-developed and well-nourished. No distress.  HENT:  Head: Normocephalic.  Eyes: Conjunctivae are normal. Pupils are equal, round, and reactive to light. No scleral icterus.  No scleral icterus. Conjunctiva are not pale.  Neck: Normal range of motion. Neck supple. No thyromegaly present.  Cardiovascular: Normal rate and regular rhythm.  Exam reveals no gallop and no friction rub.   No murmur heard. Pulmonary/Chest: Effort normal and breath sounds normal. No respiratory distress. She has no wheezes. She has no rales.  Abdominal: Soft. Bowel sounds are normal. She exhibits no distension. There is no tenderness. There is no rebound.    Musculoskeletal: Normal range of motion.  Neurological: She is alert and oriented to person, place, and time.  Skin: Skin is warm and dry. No rash noted.  Psychiatric: She has a normal mood and affect. Her behavior is normal.    ED Course  Procedures (including critical care time) Labs Review Labs Reviewed  CBC WITH DIFFERENTIAL - Abnormal; Notable for the following:    Hemoglobin 11.9 (*)    All other components within normal limits  COMPREHENSIVE METABOLIC PANEL - Abnormal; Notable for the following:    Glucose, Bld 120 (*)    All other components within normal limits  LIPASE, BLOOD  HCG, SERUM, QUALITATIVE   Imaging Review No results found.  EKG Interpretation   None       MDM   1. Abdominal pain   2. Peptic ulcer disease   3. Gastritis    Normal hepatobiliary and pancreatic enzymes. Really a benign exam. She had a gallbladder and right upper quadrant ultrasound in November of 2014 was normal. No cholecystitis or cholelithiasis.  Gastroesophageal. She drinks very infrequently. Avoid alcohol. Avoid caffeine. Primary care followup in 10-14 days if not  improving for possible GI referral. In the meanwhile Prilosec twice a day until improving and then once a day until one month is completed. When necessary Zofran prescription when necessary Ultram prescription.    Rolland Porter, MD 12/24/12 0630

## 2012-12-24 NOTE — ED Notes (Signed)
Pt c/o generalized abd pain for about a week with vomiting

## 2013-02-18 ENCOUNTER — Encounter: Payer: Self-pay | Admitting: *Deleted

## 2013-02-18 ENCOUNTER — Ambulatory Visit: Payer: Medicaid Other

## 2013-02-19 ENCOUNTER — Ambulatory Visit (INDEPENDENT_AMBULATORY_CARE_PROVIDER_SITE_OTHER): Payer: Medicaid Other | Admitting: Adult Health

## 2013-02-19 ENCOUNTER — Encounter: Payer: Self-pay | Admitting: Adult Health

## 2013-02-19 VITALS — BP 110/72 | Ht 68.0 in | Wt 130.0 lb

## 2013-02-19 DIAGNOSIS — Z3200 Encounter for pregnancy test, result unknown: Secondary | ICD-10-CM

## 2013-02-19 DIAGNOSIS — Z309 Encounter for contraceptive management, unspecified: Secondary | ICD-10-CM

## 2013-02-19 DIAGNOSIS — Z3049 Encounter for surveillance of other contraceptives: Secondary | ICD-10-CM

## 2013-02-19 DIAGNOSIS — Z3202 Encounter for pregnancy test, result negative: Secondary | ICD-10-CM

## 2013-02-19 LAB — POCT URINE PREGNANCY: PREG TEST UR: NEGATIVE

## 2013-02-19 MED ORDER — MEDROXYPROGESTERONE ACETATE 150 MG/ML IM SUSP
150.0000 mg | Freq: Once | INTRAMUSCULAR | Status: AC
  Administered 2013-02-19: 150 mg via INTRAMUSCULAR

## 2013-03-20 ENCOUNTER — Encounter: Payer: Self-pay | Admitting: Obstetrics & Gynecology

## 2013-03-20 ENCOUNTER — Ambulatory Visit (INDEPENDENT_AMBULATORY_CARE_PROVIDER_SITE_OTHER): Payer: Medicaid Other | Admitting: Obstetrics & Gynecology

## 2013-03-20 VITALS — BP 90/70 | Wt 125.0 lb

## 2013-03-20 DIAGNOSIS — N949 Unspecified condition associated with female genital organs and menstrual cycle: Secondary | ICD-10-CM

## 2013-03-20 DIAGNOSIS — N938 Other specified abnormal uterine and vaginal bleeding: Secondary | ICD-10-CM

## 2013-03-20 DIAGNOSIS — N925 Other specified irregular menstruation: Secondary | ICD-10-CM

## 2013-03-20 MED ORDER — MEGESTROL ACETATE 40 MG PO TABS: ORAL_TABLET | ORAL | Status: DC

## 2013-03-20 NOTE — Progress Notes (Signed)
Patient ID: Lori Johnston, female   DOB: 02-05-1990, 23 y.o.   MRN: [id number] Pt has been bleeding for 3 weeks  Wants to switch to mirena  Megace given  appt for her mirena made  Past Medical History  Diagnosis Date  . Bronchitis   . Migraine   . History of chlamydia   . Vaginal discharge 12/10/2012  . BV (bacterial vaginosis) 12/10/2012  . Dyspareunia 12/10/2012    Past Surgical History  Procedure Laterality Date  . Wisdom tooth extraction    . Dilation and curettage of uterus N/A 03/18/2012    Procedure: DILATATION AND CURETTAGE;  Surgeon: Mora Bellman, MD;  Location: [facility];  Service: Gynecology;  Laterality: N/A;    OB History   Grav Para Term Preterm Abortions TAB SAB Ect Mult Living   2 2 1 1  0 0 0 0 0 2      Allergies  Allergen Reactions  . Bee Venom Anaphylaxis  . Other Anaphylaxis and Rash    Tomatoes. Swelling    History   Social History  . Marital Status: Single    Spouse Name: N/A    Number of Children: N/A  . Years of Education: N/A   Social History Main Topics  . Smoking status: Current Every Day Smoker -- 0.20 packs/day for 2 years    Types: Cigarettes    Last Attempt to Quit: 08/12/2010  . Smokeless tobacco: Never Used  . Alcohol Use: Yes     Comment: occ  . Drug Use: No     Comment: THC in past but stopped with pregnancy  . Sexual Activity: Yes    Birth Control/ Protection: Injection     Comment: hx chlamydia during 1st preg; was treated   Other Topics Concern  . None   Social History Narrative  . None    Family History  Problem Relation Age of Onset  . Anesthesia problems Neg Hx   . Hypotension Neg Hx   . Malignant hyperthermia Neg Hx   . Pseudochol deficiency Neg Hx   . Diabetes Cousin   . Hypertension Cousin   . Ulcers Cousin

## 2013-04-16 ENCOUNTER — Other Ambulatory Visit: Payer: Medicaid Other

## 2013-05-14 ENCOUNTER — Ambulatory Visit: Payer: Medicaid Other | Admitting: Obstetrics and Gynecology

## 2013-05-14 ENCOUNTER — Ambulatory Visit: Payer: Medicaid Other

## 2013-05-14 ENCOUNTER — Encounter: Payer: Self-pay | Admitting: Advanced Practice Midwife

## 2013-05-14 ENCOUNTER — Ambulatory Visit (INDEPENDENT_AMBULATORY_CARE_PROVIDER_SITE_OTHER): Payer: Medicaid Other | Admitting: Advanced Practice Midwife

## 2013-05-14 VITALS — BP 100/68 | Ht 68.0 in | Wt 128.0 lb

## 2013-05-14 DIAGNOSIS — Z3202 Encounter for pregnancy test, result negative: Secondary | ICD-10-CM

## 2013-05-14 DIAGNOSIS — Z30430 Encounter for insertion of intrauterine contraceptive device: Secondary | ICD-10-CM

## 2013-05-14 LAB — POCT URINE PREGNANCY: Preg Test, Ur: NEGATIVE

## 2013-05-14 NOTE — Progress Notes (Signed)
Lori Johnston is a 23 y.o. year old [place] American female   who presents for placement of a Mirena IUD.She is currently on Depo and her pregnancy test today is negative.  The risks and benefits of the method and placement have been thouroughly reviewed with the patient and all questions were answered.  Specifically the patient is aware of failure rate of 01/998, expulsion of the IUD and of possible perforation.  The patient is aware of irregular bleeding due to the method and understands the incidence of irregular bleeding diminishes with time.  Time out was performed.  A Graves speculum was placed.  The cervix was prepped using Betadine. The uterus was found to be neutral and it sounded to 7 cm.  The cervix was grasped with a tenaculum and the IUD was inserted to 7 cm.  It was pulled back 1 cm and the IUD was disengaged.  The strings were trimmed to 3 cm.  Sonogram was performed and the proper placement of the IUD was verified.  The patient was instructed on signs and symptoms of infection and to check for the strings after each menses or each month.  The patient is to refrain from intercourse for 3 days.  The patient is scheduled for a return appointment after her first menses or 4 weeks.  [name] 05/14/2013 10:09 AM

## 2013-05-29 ENCOUNTER — Encounter (HOSPITAL_COMMUNITY): Payer: Self-pay | Admitting: Emergency Medicine

## 2013-05-29 ENCOUNTER — Emergency Department (HOSPITAL_COMMUNITY): Payer: Medicaid Other

## 2013-05-29 ENCOUNTER — Emergency Department (HOSPITAL_COMMUNITY)
Admission: EM | Admit: 2013-05-29 | Discharge: 2013-05-29 | Disposition: A | Discharge status: Home / Self Care | Payer: Medicaid Other | Attending: Emergency Medicine | Admitting: Emergency Medicine

## 2013-05-29 DIAGNOSIS — S0990XA Unspecified injury of head, initial encounter: Secondary | ICD-10-CM

## 2013-05-29 DIAGNOSIS — Z8619 Personal history of other infectious and parasitic diseases: Secondary | ICD-10-CM | POA: Insufficient documentation

## 2013-05-29 DIAGNOSIS — Z8709 Personal history of other diseases of the respiratory system: Secondary | ICD-10-CM | POA: Insufficient documentation

## 2013-05-29 DIAGNOSIS — Z8679 Personal history of other diseases of the circulatory system: Secondary | ICD-10-CM | POA: Insufficient documentation

## 2013-05-29 DIAGNOSIS — Z8742 Personal history of other diseases of the female genital tract: Secondary | ICD-10-CM | POA: Insufficient documentation

## 2013-05-29 DIAGNOSIS — R55 Syncope and collapse: Secondary | ICD-10-CM | POA: Insufficient documentation

## 2013-05-29 DIAGNOSIS — F17200 Nicotine dependence, unspecified, uncomplicated: Secondary | ICD-10-CM | POA: Insufficient documentation

## 2013-05-29 DIAGNOSIS — T07XXXA Unspecified multiple injuries, initial encounter: Secondary | ICD-10-CM | POA: Insufficient documentation

## 2013-05-29 MED ORDER — HYDROCODONE-ACETAMINOPHEN 5-325 MG PO TABS: ORAL_TABLET | ORAL | Status: DC

## 2013-05-29 MED ORDER — CYCLOBENZAPRINE HCL 10 MG PO TABS: 10.0000 mg | ORAL_TABLET | Freq: Three times a day (TID) | ORAL | Status: DC | PRN
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
## 2013-05-29 NOTE — Discharge Instructions (Signed)
Assault, General Assault includes any behavior, whether intentional or reckless, which results in bodily injury to another person and/or damage to property. Included in this would be any behavior, intentional or reckless, that by its nature would be understood (interpreted) by a reasonable person as intent to harm another person or to damage his/her property. Threats may be oral or written. They may be communicated through regular mail, computer, fax, or phone. These threats may be direct or implied. FORMS OF ASSAULT INCLUDE:  Physically assaulting a person. This includes physical threats to inflict physical harm as well as:  Slapping.  Hitting.  Poking.  Kicking.  Punching.  Pushing.  Arson.  Sabotage.  Equipment vandalism.  Damaging or destroying property.  Throwing or hitting objects.  Displaying a weapon or an object that appears to be a weapon in a threatening manner.  Carrying a firearm of any kind.  Using a weapon to harm someone.  Using greater physical size/strength to intimidate another.  Making intimidating or threatening gestures.  Bullying.  Hazing.  Intimidating, threatening, hostile, or abusive language directed toward another person.  It communicates the intention to engage in violence against that person. And it leads a reasonable person to expect that violent behavior may occur.  Stalking another person. IF IT HAPPENS AGAIN:  Immediately call for emergency help (911 in [place]  If someone poses clear and immediate danger to you, seek legal authorities to have a protective or restraining order put in place.  Less threatening assaults can at least be reported to authorities. STEPS TO TAKE IF A SEXUAL ASSAULT HAS HAPPENED  Go to an area of safety. This may include a shelter or staying with a friend. Stay away from the area where you have been attacked. A large percentage of sexual assaults are caused by a friend, relative or associate.  If  medications were given by your caregiver, take them as directed for the full length of time prescribed.  Only take over-the-counter or prescription medicines for pain, discomfort, or fever as directed by your caregiver.  If you have come in contact with a sexual disease, find out if you are to be tested again. If your caregiver is concerned about the HIV/AIDS virus, he/she may require you to have continued testing for several months.  For the protection of your privacy, test results can not be given over the phone. Make sure you receive the results of your test. If your test results are not back during your visit, make an appointment with your caregiver to find out the results. Do not assume everything is normal if you have not heard from your caregiver or the medical facility. It is important for you to follow up on all of your test results.  File appropriate papers with authorities. This is important in all assaults, even if it has occurred in a family or by a friend. SEEK MEDICAL CARE IF:  You have new problems because of your injuries.  You have problems that may be because of the medicine you are taking, such as:  Rash.  Itching.  Swelling.  Trouble breathing.  You develop belly (abdominal) pain, feel sick to your stomach (nausea) or are vomiting.  You begin to run a temperature.  You need supportive care or referral to a rape crisis center. These are centers with trained personnel who can help you get through this ordeal. SEEK IMMEDIATE MEDICAL CARE IF:  You are afraid of being threatened, beaten, or abused. In [place] call 911.  You  receive new injuries related to abuse.  You develop severe pain in any area injured in the assault or have any change in your condition that concerns you.  You faint or lose consciousness.  You develop chest pain or shortness of breath. Document Released: 12/18/2004 Document Revised: 03/12/2011 Document Reviewed: 08/06/2007 [facility] Patient  Information 2014 [place].  Contusion A contusion is a deep bruise. Contusions happen when an injury causes bleeding under the skin. Signs of bruising include pain, puffiness (swelling), and discolored skin. The contusion may turn blue, purple, or yellow. HOME CARE   Put ice on the injured area.  Put ice in a plastic bag.  Place a towel between your skin and the bag.  Leave the ice on for 15-20 minutes, 03-04 times a day.  Only take medicine as told by your doctor.  Rest the injured area.  If possible, raise (elevate) the injured area to lessen puffiness. GET HELP RIGHT AWAY IF:   You have more bruising or puffiness.  You have pain that is getting worse.  Your puffiness or pain is not helped by medicine. MAKE SURE YOU:   Understand these instructions.  Will watch your condition.  Will get help right away if you are not doing well or get worse. Document Released: 06/06/2007 Document Revised: 03/12/2011 Document Reviewed: 10/23/2010 [facility] Patient Information 2014 [place], [place].  Head Injury, Adult You have a head injury. Headaches and throwing up (vomiting) are common after a head injury. It should be easy to wake up from sleeping. Sometimes you must stay in the hospital. Most problems happen within the first 24 hours. Side effects may occur up to 7 10 days after the injury.  WHAT ARE THE TYPES OF HEAD INJURIES? Head injuries can be as minor as a bump. Some head injuries can be more severe. More severe head injuries include:  A jarring injury to the brain (concussion).  A bruise of the brain (contusion). This mean there is bleeding in the brain that can cause swelling.  A cracked skull (skull fracture).  Bleeding in the brain that collects, clots, and forms a bump (hematoma). . WHEN SHOULD I GET HELP RIGHT AWAY?   You are confused or sleepy.  You cannot be woken up.  You feel sick to your stomach (nauseous) or keep throwing up.  Your dizziness or  unsteadiness is get worse.  You have very bad, lasting headaches that are not helped by medicine.  You cannot use your arms or legs like normal  You cannot walk.  You notice changes in the black spots in the center of the colored part of your eye (pupil).  You have clear or bloody fluid coming from your nose or ears.  You have trouble seeing. During the next 24 hours after the injury, you must stay with someone who can watch you. This person should get help right away (call 911 in the [place] if you start to shake and are not able to control it (seizures), you become pass out, or you are unable to wake up. HOW CAN I PREVENT A HEAD INJURY IN THE FUTURE?  Wear seat belts.  Wear helmets while bike riding and playing sports like football.  Stay away from dangerous activities around the house. WHEN CAN I RETURN TO NORMAL ACTIVITIES AND ATHLETICS? See your doctor before doing these activities. You should not do normal activities or play contact sports until 1 week after the following symptoms have stopped:  Headache that does not go away.  Dizziness.  Poor attention.  Confusion.  Memory problems.  Sickness to your stomach or throwing up.  Tiredness.  Fussiness.  Bothered by bright lights or loud noises.  Anxiousness or depression.  Restless sleep. MAKE SURE YOU:   Understand these instructions.  Will watch your condition.  Will get help right away if you are not doing well or get worse. Document Released: 12/01/2007 Document Revised: 10/08/2012 Document Reviewed: 08/25/2012 [facility] Patient Information 2014 [place].

## 2013-05-30 NOTE — ED Provider Notes (Signed)
Medical screening examination/treatment/procedure(s) were performed by non-physician practitioner and as supervising physician I was immediately available for consultation/collaboration.   EKG Interpretation None        Sharyon Cable, MD 05/30/13 2345

## 2013-06-11 ENCOUNTER — Telehealth: Payer: Self-pay | Admitting: *Deleted

## 2013-06-11 ENCOUNTER — Ambulatory Visit: Payer: Medicaid Other | Admitting: Advanced Practice Midwife

## 2013-06-11 MED ORDER — MEGESTROL ACETATE 40 MG PO TABS: ORAL_TABLET | ORAL | Status: DC

## 2013-06-11 NOTE — Telephone Encounter (Signed)
Will rx megace if bleeding does not stop needs to be seen

## 2013-06-11 NOTE — Telephone Encounter (Signed)
Spoke with pt. Pt got an IUD on 5/14. She started bleeding the next day. It goes from spotting to heavy and back and forth. I advised when you get a new birth control, it can take a few months to get regulated. Pt would rather have med to help with the bleeding. Please advise. Thanks!!! [employer]

## 2013-06-17 ENCOUNTER — Ambulatory Visit: Payer: Medicaid Other | Admitting: Advanced Practice Midwife

## 2013-06-24 ENCOUNTER — Ambulatory Visit: Payer: Medicaid Other | Admitting: Advanced Practice Midwife

## 2013-07-01 ENCOUNTER — Ambulatory Visit: Payer: Medicaid Other | Admitting: Adult Health

## 2013-07-16 ENCOUNTER — Ambulatory Visit (INDEPENDENT_AMBULATORY_CARE_PROVIDER_SITE_OTHER): Payer: Medicaid Other | Admitting: Adult Health

## 2013-07-16 ENCOUNTER — Encounter: Payer: Self-pay | Admitting: Adult Health

## 2013-07-16 VITALS — BP 100/60 | Ht 68.0 in | Wt 132.0 lb

## 2013-07-16 DIAGNOSIS — Z30432 Encounter for removal of intrauterine contraceptive device: Secondary | ICD-10-CM

## 2013-07-16 DIAGNOSIS — N898 Other specified noninflammatory disorders of vagina: Secondary | ICD-10-CM

## 2013-07-16 DIAGNOSIS — Z3009 Encounter for other general counseling and advice on contraception: Secondary | ICD-10-CM

## 2013-07-16 DIAGNOSIS — R1011 Right upper quadrant pain: Secondary | ICD-10-CM

## 2013-07-16 DIAGNOSIS — Z8742 Personal history of other diseases of the female genital tract: Secondary | ICD-10-CM | POA: Insufficient documentation

## 2013-07-16 DIAGNOSIS — B379 Candidiasis, unspecified: Secondary | ICD-10-CM | POA: Insufficient documentation

## 2013-07-16 HISTORY — DX: Personal history of other diseases of the female genital tract: Z87.42

## 2013-07-16 HISTORY — DX: Encounter for other general counseling and advice on contraception: Z30.09

## 2013-07-16 HISTORY — DX: Candidiasis, unspecified: B37.9

## 2013-07-16 HISTORY — DX: Right upper quadrant pain: R10.11

## 2013-07-16 MED ORDER — FLUCONAZOLE 150 MG PO TABS: ORAL_TABLET | ORAL | Status: DC

## 2013-07-16 MED ORDER — DOXYCYCLINE HYCLATE 100 MG PO CAPS: 100.0000 mg | ORAL_CAPSULE | Freq: Two times a day (BID) | ORAL | Status: DC

## 2013-07-16 NOTE — Patient Instructions (Signed)
Monilial Vaginitis Vaginitis in a soreness, swelling and redness (inflammation) of the vagina and vulva. Monilial vaginitis is not a sexually transmitted infection. CAUSES  Yeast vaginitis is caused by yeast (candida) that is normally found in your vagina. With a yeast infection, the candida has overgrown in number to a point that upsets the chemical balance. SYMPTOMS   White, thick vaginal discharge.  Swelling, itching, redness and irritation of the vagina and possibly the lips of the vagina (vulva).  Burning or painful urination.  Painful intercourse. DIAGNOSIS  Things that may contribute to monilial vaginitis are:  Postmenopausal and virginal states.  Pregnancy.  Infections.  Being tired, sick or stressed, especially if you had monilial vaginitis in the past.  Diabetes. Good control will help lower the chance.  Birth control pills.  Tight fitting garments.  Using bubble bath, feminine sprays, douches or deodorant tampons.  Taking certain medications that kill germs (antibiotics).  Sporadic recurrence can occur if you become ill. TREATMENT  Your caregiver will give you medication.  There are several kinds of anti monilial vaginal creams and suppositories specific for monilial vaginitis. For recurrent yeast infections, use a suppository or cream in the vagina 2 times a week, or as directed.  Anti-monilial or steroid cream for the itching or irritation of the vulva may also be used. Get your caregiver's permission.  Painting the vagina with methylene blue solution may help if the monilial cream does not work.  Eating yogurt may help prevent monilial vaginitis. HOME CARE INSTRUCTIONS   Finish all medication as prescribed.  Do not have sex until treatment is completed or after your caregiver tells you it is okay.  Take warm sitz baths.  Do not douche.  Do not use tampons, especially scented ones.  Wear cotton underwear.  Avoid tight pants and panty  hose.  Tell your sexual partner that you have a yeast infection. They should go to their caregiver if they have symptoms such as mild rash or itching.  Your sexual partner should be treated as well if your infection is difficult to eliminate.  Practice safer sex. Use condoms.  Some vaginal medications cause latex condoms to fail. Vaginal medications that harm condoms are:  Cleocin cream.  Butoconazole (Femstat).  Terconazole (Terazol) vaginal suppository.  Miconazole (Monistat) (may be purchased over the counter). SEEK MEDICAL CARE IF:   You have a temperature by mouth above 102 F (38.9 C).  The infection is getting worse after 2 days of treatment.  The infection is not getting better after 3 days of treatment.  You develop blisters in or around your vagina.  You develop vaginal bleeding, and it is not your menstrual period.  You have pain when you urinate.  You develop intestinal problems.  You have pain with sexual intercourse. Document Released: 09/27/2004 Document Revised: 03/12/2011 Document Reviewed: 06/11/2008 [facility] Patient Information 2015 [place], [place]. This information is not intended to replace advice given to you by your health care provider. Make sure you discuss any questions you have with your health care provider. Take doxycycline and NO SEX GB US 7/22 at 9am  return in  2 weeks for follow up
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
## 2013-07-22 ENCOUNTER — Other Ambulatory Visit: Payer: Self-pay | Admitting: Adult Health

## 2013-07-22 ENCOUNTER — Ambulatory Visit (HOSPITAL_COMMUNITY)
Admission: RE | Admit: 2013-07-22 | Discharge: 2013-07-22 | Disposition: A | Discharge status: Home / Self Care | Payer: Medicaid Other | Source: Ambulatory Visit | Attending: Adult Health | Admitting: Adult Health

## 2013-07-22 DIAGNOSIS — R1011 Right upper quadrant pain: Secondary | ICD-10-CM | POA: Insufficient documentation

## 2013-07-30 ENCOUNTER — Encounter: Payer: Self-pay | Admitting: Adult Health

## 2013-07-30 ENCOUNTER — Ambulatory Visit (INDEPENDENT_AMBULATORY_CARE_PROVIDER_SITE_OTHER): Payer: Medicaid Other | Admitting: Adult Health

## 2013-07-30 VITALS — BP 100/70 | Ht 68.0 in | Wt 129.0 lb

## 2013-07-30 DIAGNOSIS — Z8742 Personal history of other diseases of the female genital tract: Secondary | ICD-10-CM

## 2013-07-30 DIAGNOSIS — Z3009 Encounter for other general counseling and advice on contraception: Secondary | ICD-10-CM

## 2013-07-30 DIAGNOSIS — R1011 Right upper quadrant pain: Secondary | ICD-10-CM

## 2013-07-30 NOTE — Progress Notes (Signed)
Subjective:     Patient ID: Lori Johnston, female   DOB: [birth year], 23 y.o.   MRN: [id number]  HPI Dorinne is a 23 year old black female back in follow up of PID and RUQ pain.Still complains of RUQ pain.  Review of Systems See HPI Reviewed past medical,surgical, social and family history. Reviewed medications and allergies.     Objective:   Physical Exam BP 100/70  Ht 5\' 8"  (1.727 m)  Wt 129 lb (58.514 kg)  BMI 19.62 kg/m2  LMP 07/11/2013  Breastfeeding? No Skin warm and dry.Pelvic: external genitalia is normal in appearance, vagina: white discharge without odor, cervix:smooth and bulbous,negative CMT, uterus: normal size, shape and contour, non tender, no masses felt, adnexa: no masses or tenderness noted.Has tenderness RUQ,no HSM noted, or masses.   She wants to get nexplanon after reading handout.  Assessment:     RUQ pain  Resolved PID Contraceptive counseling     Plan:     Will get HIDA scan Order nexplanon Return in 4 weeks for stat [facility] and then nexplanon insertion in pm

## 2013-07-30 NOTE — Patient Instructions (Signed)
No sex Return in 4 weeks for nexplanon Get HIDA scan at [facility]

## 2013-08-10 ENCOUNTER — Encounter (HOSPITAL_COMMUNITY): Admission: RE | Admit: 2013-08-10 | Payer: Medicaid Other | Source: Ambulatory Visit

## 2013-08-11 ENCOUNTER — Encounter (HOSPITAL_COMMUNITY)
Admission: RE | Admit: 2013-08-11 | Discharge: 2013-08-11 | Disposition: A | Discharge status: Home / Self Care | Payer: Medicaid Other | Source: Ambulatory Visit | Attending: Adult Health | Admitting: Adult Health

## 2013-08-11 ENCOUNTER — Telehealth: Payer: Self-pay | Admitting: Adult Health

## 2013-08-11 ENCOUNTER — Encounter (HOSPITAL_COMMUNITY): Payer: Self-pay

## 2013-08-11 DIAGNOSIS — R1011 Right upper quadrant pain: Secondary | ICD-10-CM | POA: Insufficient documentation

## 2013-08-11 MED ORDER — STERILE WATER FOR INJECTION IJ SOLN
INTRAMUSCULAR | Status: AC
  Administered 2013-08-11: 1.23 mL via INTRAVENOUS
  Filled 2013-08-11: qty 10

## 2013-08-11 MED ORDER — SINCALIDE 5 MCG IJ SOLR
INTRAMUSCULAR | Status: AC
  Administered 2013-08-11: 1.23 ug via INTRAVENOUS
  Filled 2013-08-11: qty 5

## 2013-08-11 MED ORDER — TECHNETIUM TC 99M MEBROFENIN IV KIT
5.0000 | PACK | Freq: Once | INTRAVENOUS | Status: AC | PRN
  Administered 2013-08-11: 4.9 via INTRAVENOUS

## 2013-08-11 NOTE — Telephone Encounter (Signed)
Left message HIDA scan negative

## 2013-08-27 ENCOUNTER — Encounter: Payer: Self-pay | Admitting: Adult Health

## 2013-08-27 ENCOUNTER — Ambulatory Visit (INDEPENDENT_AMBULATORY_CARE_PROVIDER_SITE_OTHER): Payer: Medicaid Other | Admitting: Adult Health

## 2013-08-27 ENCOUNTER — Other Ambulatory Visit: Payer: Medicaid Other

## 2013-08-27 VITALS — BP 100/60 | Ht 68.0 in | Wt 132.5 lb

## 2013-08-27 DIAGNOSIS — Z3200 Encounter for pregnancy test, result unknown: Secondary | ICD-10-CM

## 2013-08-27 DIAGNOSIS — Z30017 Encounter for initial prescription of implantable subdermal contraceptive: Secondary | ICD-10-CM | POA: Insufficient documentation

## 2013-08-27 HISTORY — DX: Encounter for initial prescription of implantable subdermal contraceptive: Z30.017

## 2013-08-27 LAB — HCG, QUANTITATIVE, PREGNANCY

## 2013-08-27 NOTE — Progress Notes (Signed)
Subjective:     Patient ID: [name], female   DOB: 26-Apr-1990, 23 y.o.   MRN: [id number]  HPI Marche is a 23 year old black female in for nexplanon insertion.  Review of Systems See HPI Reviewed past medical,surgical, social and family history. Reviewed medications and allergies.     Objective:   Physical Exam BP 100/60  Ht 5\' 8"  (1.727 m)  Wt 132 lb 8 oz (60.102 kg)  BMI 20.15 kg/m2  LMP 07/11/2013  Breastfeeding? NoQHCG <1, consent signed,time out called Left arm cleansed with betadine, and injected with 1.5 cc 2% lidocaine and waited til numb. Nexplanon easily inserted and steri strips applied.Rod easily palpated by pt and provider. Pressure dressing applied.    Assessment:     Nexplanon insertion lot #[id number] exp 1/18    Plan:     Use condoms , keep clean and dry x 24 hours, no heavy lifting, keep steri strips on x 72 hours, Keep pressure dressing on x 24 hours. Follow up prn problems.   Return in 2 months for pap and physical

## 2013-08-27 NOTE — Patient Instructions (Signed)
Use condoms, keep clean and dry x 24 hours, no heavy lifting, keep steri strips on x 72 hours, Keep pressure dressing on x 24 hours. Follow up prn problems. Return in 2 months for pap and physical

## 2013-09-02 ENCOUNTER — Ambulatory Visit: Payer: Medicaid Other | Admitting: Adult Health

## 2013-10-07 ENCOUNTER — Encounter: Payer: Self-pay | Admitting: Women's Health

## 2013-10-07 ENCOUNTER — Ambulatory Visit (INDEPENDENT_AMBULATORY_CARE_PROVIDER_SITE_OTHER): Payer: Medicaid Other | Admitting: Women's Health

## 2013-10-07 VITALS — BP 118/70 | Ht 67.75 in | Wt 144.0 lb

## 2013-10-07 DIAGNOSIS — N766 Ulceration of vulva: Secondary | ICD-10-CM

## 2013-10-07 DIAGNOSIS — N765 Ulceration of vagina: Secondary | ICD-10-CM

## 2013-10-07 DIAGNOSIS — N898 Other specified noninflammatory disorders of vagina: Secondary | ICD-10-CM

## 2013-10-07 DIAGNOSIS — A599 Trichomoniasis, unspecified: Secondary | ICD-10-CM

## 2013-10-07 DIAGNOSIS — K120 Recurrent oral aphthae: Secondary | ICD-10-CM

## 2013-10-07 LAB — POCT WET PREP (WET MOUNT)
CLUE CELLS WET PREP WHIFF POC: NEGATIVE
TRICHOMONAS WET PREP HPF POC: POSITIVE

## 2013-10-07 MED ORDER — LIDOCAINE 5 % EX OINT: 1.0000 "application " | TOPICAL_OINTMENT | CUTANEOUS | Status: DC | PRN

## 2013-10-07 MED ORDER — PREDNISONE 20 MG PO TABS: 40.0000 mg | ORAL_TABLET | Freq: Every day | ORAL | Status: DC

## 2013-10-07 MED ORDER — METRONIDAZOLE 500 MG PO TABS: 2000.0000 mg | ORAL_TABLET | Freq: Once | ORAL | Status: DC

## 2013-10-07 NOTE — Patient Instructions (Signed)
Canker Sores  °Canker sores are painful, open sores on the inside of the mouth and cheek. They may be white or yellow. The sores usually heal in 1 to 2 weeks. Women are more likely than men to have recurrent canker sores. °CAUSES °The cause of canker sores is not well understood. More than one cause is likely. Canker sores do not appear to be caused by certain types of germs (viruses or bacteria). Canker sores may be caused by: °· An allergic reaction to certain foods. °· Digestive problems. °· Not having enough vitamin B12, folic acid, and iron. °· Female sex hormones. Sores may come only during certain phases of a menstrual cycle. Often, there is improvement during pregnancy. °· Genetics. Some people seem to inherit canker sore problems. °Emotional stress and injuries to the mouth may trigger outbreaks, but not cause them.  °DIAGNOSIS °Canker sores are diagnosed by exam.  °TREATMENT °· Patients who have frequent bouts of canker sores may have cultures taken of the sores, blood tests, or allergy tests. This helps determine if their sores are caused by a poor diet, an allergy, or some other preventable or treatable disease. °· Vitamins may prevent recurrences or reduce the severity of canker sores in people with poor nutrition. °· Numbing ointments can relieve pain. These are available in drug stores without a prescription. °· Anti-inflammatory steroid mouth rinses or gels may be prescribed by your caregiver for severe sores. °· Oral steroids may be prescribed if you have severe, recurrent canker sores. These strong medicines can cause many side effects and should be used only under the close direction of a dentist or physician. °· Mouth rinses containing the antibiotic medicine may be prescribed. They may lessen symptoms and speed healing. °Healing usually happens in about 1 or 2 weeks with or without treatment. Certain antibiotic mouth rinses given to pregnant women and young children can permanently stain teeth.  Talk to your caregiver about your treatment. °HOME CARE INSTRUCTIONS  °· Avoid foods that cause canker sores for you. °· Avoid citrus juices, spicy or salty foods, and coffee until the sores are healed. °· Use a soft-bristled toothbrush. °· Chew your food carefully to avoid biting your cheek. °· Apply topical numbing medicine to the sore to help relieve pain. °· Apply a thin paste of baking soda and water to the sore to help heal the sore. °· Only use mouth rinses or medicines for pain or discomfort as directed by your caregiver. °SEEK MEDICAL CARE IF:  °· Your symptoms are not better in 1 week. °· Your sores are still present after 2 weeks. °· Your sores are very painful. °· You have trouble breathing or swallowing. °· Your sores come back frequently. °Document Released: 04/14/2010 Document Revised: 04/14/2012 Document Reviewed: 04/14/2010 °ExitCare® Patient Information ©2015 [employer]. This information is not intended to replace advice given to you by your health care provider. Make sure you discuss any questions you have with your health care provider. ° °

## 2013-10-07 NOTE — Progress Notes (Signed)
Patient ID: [name], female   DOB: 09-Sep-1990, 23 y.o.   MRN: [id number]   [facility] Visit  Patient name: Lori Johnston MRN [id number]  Date of birth: [birth year]  CC & HPI:  Lori Johnston is a 23 y.o. African American female presenting today for report of yellow d/c x 2 d w/o odor, and vulvar burning w/ urination x 1 day. Boyfriend just got out of jail yesterday, has been in x 4 months. They had sex last night- hurt like the first time she ever had sex. H/O chlamydia in past, no other STIs that she knows of. BF told her he was tested for everything in jail and was neg. Has nexplanon, likes it, thinks its making her hungry and gain weight, but she wants to keep it. Last pap at [facility] this year and was neg per her report.   Pertinent History Reviewed:  Medical & Surgical Hx:   Past Medical History  Diagnosis Date  . Bronchitis   . Migraine   . History of chlamydia   . Vaginal discharge 12/10/2012  . BV (bacterial vaginosis) 12/10/2012  . Dyspareunia 12/10/2012  . PID (pelvic inflammatory disease)   . History of PID 07/16/2013  . RUQ pain 07/16/2013  . Yeast infection 07/16/2013  . Contraceptive education 07/16/2013  . Nexplanon insertion 08/27/2013   Past Surgical History  Procedure Laterality Date  . Wisdom tooth extraction    . Dilation and curettage of uterus N/A 03/18/2012    Procedure: DILATATION AND CURETTAGE;  Surgeon: Mora Bellman, MD;  Location: [facility];  Service: Gynecology;  Laterality: N/A;  . Tooth pulled     Medications: Reviewed & Updated - see associated section Social History: Reviewed -  reports that she quit smoking about 3 years ago. Her smoking use included Cigarettes. She has a .4 pack-year smoking history. She has never used smokeless tobacco.  Objective Findings:  Vitals: BP 118/70  Ht 5' 7.75" (1.721 m)  Wt 144 lb (65.318 kg)  BMI 22.05 kg/m2  LMP 07/11/2013  Physical Examination: General appearance - alert, well appearing, and in no  distress Pelvic - external genitalia w/ silver ulcerated areas around introitus- tender to touch. Spec exam: ~3x3cm area of superficial irritation anterior to cervix- bleeds easily- looks like trauma from sex. Cx appears normal, small amt yellow slightly malodorous d/c.  Co-exam w/ JAG: thinks ulcerated areas are aphthous ulcers  Results for orders placed in visit on 10/07/13 (from the past 24 hour(s))  POCT WET PREP (WET MOUNT)   Collection Time    10/07/13  4:15 PM      Result Value Ref Range   Source Wet Prep POC vaginal     WBC, Wet Prep HPF POC few     Bacteria Wet Prep HPF POC none     BACTERIA WET PREP MORPHOLOGY POC       Clue Cells Wet Prep HPF POC None     Clue Cells Wet Prep Whiff POC Negative Whiff     Yeast Wet Prep HPF POC None     KOH Wet Prep POC       Trichomonas Wet Prep HPF POC pos    Wet prep: round cells that are wiggling, w/o tails- will treat as trichomonas  Assessment & Plan:  A:   Aphthous ulcers at introitus  Trichomonas P:  Rx lidocaine 5% ointment and prednisone 40mg  daily x 7d for ulcers  Rx flagyl 2gm po x 1 for trichomonas,  no etoh  No sex x at least 7d   Will send urine for gc/ct, and check serum hsv2 today  Get pap records from [facility]  F/U 1wk for f/u  Tawnya Crook CNM, [facility] 10/07/2013 4:15 PM

## 2013-10-08 ENCOUNTER — Telehealth: Payer: Self-pay | Admitting: Adult Health

## 2013-10-08 LAB — GC/CHLAMYDIA PROBE AMP
CT Probe RNA: NEGATIVE
GC Probe RNA: NEGATIVE

## 2013-10-08 LAB — HSV 2 ANTIBODY, IGG

## 2013-10-08 NOTE — Telephone Encounter (Signed)
Had questions about meds was 37 dollars,that was the lidocaine ointment, get flagyl and prednisone should be $6 can wait on lidocaine or can go to [facility] has 4% ointment for$9.99.

## 2013-10-14 ENCOUNTER — Ambulatory Visit: Payer: Medicaid Other | Admitting: Women's Health

## 2013-10-19 ENCOUNTER — Encounter: Payer: Self-pay | Admitting: Women's Health

## 2013-10-19 ENCOUNTER — Ambulatory Visit (INDEPENDENT_AMBULATORY_CARE_PROVIDER_SITE_OTHER): Payer: Medicaid Other | Admitting: Women's Health

## 2013-10-19 VITALS — BP 110/80 | Wt 146.0 lb

## 2013-10-19 DIAGNOSIS — A599 Trichomoniasis, unspecified: Secondary | ICD-10-CM

## 2013-10-19 LAB — POCT WET PREP (WET MOUNT): Clue Cells Wet Prep Whiff POC: NEGATIVE

## 2013-10-19 NOTE — Progress Notes (Signed)
Patient ID: [name], female   DOB: [birth year], 23 y.o.   MRN: [id number]   [facility] Visit  Patient name: Lori Johnston MRN [id number]  Date of birth: June 23, 1990  CC & HPI:  Lori Johnston is a 23 y.o. African American female presenting today for f/u after being treated for trichomonas and vulvar aphthous ulcers on 10/07/13. She reports all sx have resolved, feeling much better. Has appt for pap next week, but is pretty sure she had normal pap this year w/ [facility] this year.   Pertinent History Reviewed:  Medical & Surgical Hx:   Past Medical History  Diagnosis Date  . Bronchitis   . Migraine   . History of chlamydia   . Vaginal discharge 12/10/2012  . BV (bacterial vaginosis) 12/10/2012  . Dyspareunia 12/10/2012  . PID (pelvic inflammatory disease)   . History of PID 07/16/2013  . RUQ pain 07/16/2013  . Yeast infection 07/16/2013  . Contraceptive education 07/16/2013  . Nexplanon insertion 08/27/2013   Past Surgical History  Procedure Laterality Date  . Wisdom tooth extraction    . Dilation and curettage of uterus N/A 03/18/2012    Procedure: DILATATION AND CURETTAGE;  Surgeon: Mora Bellman, MD;  Location: [facility];  Service: Gynecology;  Laterality: N/A;  . Tooth pulled     Medications: Reviewed & Updated - see associated section Social History: Reviewed -  reports that she quit smoking about 3 years ago. Her smoking use included Cigarettes. She has a .4 pack-year smoking history. She has never used smokeless tobacco.  Objective Findings:  Vitals: BP 110/80  Wt 146 lb (66.225 kg)  LMP 07/11/2013  Physical Examination: General appearance - alert, well appearing, and in no distress Pelvic - normal external genitalia, vulva, vagina, cervix, uterus and adnexa  Results for orders placed in visit on 10/19/13 (from the past 24 hour(s))  POCT WET PREP (WET MOUNT)   Collection Time    10/19/13  2:16 PM      Result Value Ref Range   Source Wet Prep POC vaginal     WBC, Wet Prep HPF POC none     Bacteria Wet Prep HPF POC none     BACTERIA WET PREP MORPHOLOGY POC       Clue Cells Wet Prep HPF POC None     Clue Cells Wet Prep Whiff POC Negative Whiff     Yeast Wet Prep HPF POC None     KOH Wet Prep POC       Trichomonas Wet Prep HPF POC none       Assessment & Plan:  A:   Vulvar aphthous ulcers are healed  Trichomonas poc: neg P:  Send another request for pap records from [place], pt also to call and make sure she had a normal pap this year, if so, to cancel pap appt next week w/ [place]   F/U prn  Tawnya Crook CNM, [facility] 10/19/2013 2:17 PM

## 2013-10-27 ENCOUNTER — Other Ambulatory Visit: Payer: Medicaid Other | Admitting: Adult Health

## 2013-11-02 ENCOUNTER — Encounter: Payer: Self-pay | Admitting: Women's Health

## 2013-11-05 ENCOUNTER — Telehealth: Payer: Self-pay | Admitting: *Deleted

## 2013-11-05 NOTE — Telephone Encounter (Signed)
I spoke with Lori Johnston and she advised to tell the pt to stop using the pads she is currently using and try a different one, if it 's not better by Monday to call and we will work her in to be seen.   I advised the pt of above and she verbalized understanding.

## 2013-11-05 NOTE — Telephone Encounter (Signed)
Pt states that she has a rash between her legs. Pt states that she breaks out when she uses pads while on her periods. Pt states that she has pink patches on her vaginal area and on her bottom. Pt states that she has had it for about 2-3 days. Pt advised that I would speak with JAG and see what she thinks. Pt verbalized understanding.

## 2013-11-09 ENCOUNTER — Telehealth: Payer: Self-pay | Admitting: Adult Health

## 2013-11-09 NOTE — Telephone Encounter (Signed)
Pt aware that wet prep showed no trich

## 2014-02-18 ENCOUNTER — Telehealth: Payer: Self-pay | Admitting: Adult Health

## 2014-02-18 NOTE — Telephone Encounter (Signed)
Complains of bleeding for 2 weeks wants megace, needs to make appt

## 2014-02-25 ENCOUNTER — Encounter: Payer: Self-pay | Admitting: *Deleted

## 2014-02-25 ENCOUNTER — Ambulatory Visit: Payer: Medicaid Other | Admitting: Adult Health

## 2014-03-13 ENCOUNTER — Encounter (HOSPITAL_COMMUNITY): Payer: Self-pay | Admitting: *Deleted

## 2014-03-13 ENCOUNTER — Emergency Department (HOSPITAL_COMMUNITY)
Admission: EM | Admit: 2014-03-13 | Discharge: 2014-03-14 | Disposition: A | Discharge status: Home / Self Care | Payer: Medicaid Other | Attending: Emergency Medicine | Admitting: Emergency Medicine

## 2014-03-13 DIAGNOSIS — Z7952 Long term (current) use of systemic steroids: Secondary | ICD-10-CM | POA: Diagnosis not present

## 2014-03-13 DIAGNOSIS — Z79899 Other long term (current) drug therapy: Secondary | ICD-10-CM | POA: Insufficient documentation

## 2014-03-13 DIAGNOSIS — Z8619 Personal history of other infectious and parasitic diseases: Secondary | ICD-10-CM | POA: Insufficient documentation

## 2014-03-13 DIAGNOSIS — N760 Acute vaginitis: Secondary | ICD-10-CM | POA: Insufficient documentation

## 2014-03-13 DIAGNOSIS — Z87891 Personal history of nicotine dependence: Secondary | ICD-10-CM | POA: Insufficient documentation

## 2014-03-13 DIAGNOSIS — Z87440 Personal history of urinary (tract) infections: Secondary | ICD-10-CM | POA: Insufficient documentation

## 2014-03-13 DIAGNOSIS — R109 Unspecified abdominal pain: Secondary | ICD-10-CM

## 2014-03-13 DIAGNOSIS — Z8709 Personal history of other diseases of the respiratory system: Secondary | ICD-10-CM | POA: Diagnosis not present

## 2014-03-13 DIAGNOSIS — Z3202 Encounter for pregnancy test, result negative: Secondary | ICD-10-CM | POA: Insufficient documentation

## 2014-03-13 DIAGNOSIS — R1031 Right lower quadrant pain: Secondary | ICD-10-CM

## 2014-03-13 DIAGNOSIS — Z9889 Other specified postprocedural states: Secondary | ICD-10-CM | POA: Insufficient documentation

## 2014-03-13 DIAGNOSIS — N9489 Other specified conditions associated with female genital organs and menstrual cycle: Secondary | ICD-10-CM | POA: Diagnosis not present

## 2014-03-13 DIAGNOSIS — B9689 Other specified bacterial agents as the cause of diseases classified elsewhere: Secondary | ICD-10-CM

## 2014-03-13 HISTORY — DX: Urinary tract infection, site not specified: N39.0

## 2014-03-13 NOTE — ED Notes (Signed)
Pt with right lower abd pain and lower back pain, + burning with urination, + nausea

## 2014-03-13 NOTE — ED Notes (Signed)
PA Idol in room with pt

## 2014-03-14 ENCOUNTER — Emergency Department (HOSPITAL_COMMUNITY): Payer: Medicaid Other

## 2014-03-14 LAB — BASIC METABOLIC PANEL
Anion gap: 5 (ref 5–15)
BUN: 13 mg/dL (ref 6–23)
CO2: 25 mmol/L (ref 19–32)
CREATININE: 0.65 mg/dL (ref 0.50–1.10)
Calcium: 8.6 mg/dL (ref 8.4–10.5)
Chloride: 109 mmol/L (ref 96–112)
GFR calc Af Amer: >90 mL/min (ref >90)
GFR calc non Af Amer: >90 mL/min (ref >90)
GLUCOSE: 102 mg/dL — AB (ref 70–99)
POTASSIUM: 3.9 mmol/L (ref 3.5–5.1)
SODIUM: 139 mmol/L (ref 135–145)

## 2014-03-14 LAB — CBC WITH DIFFERENTIAL/PLATELET
BASOS PCT: 0 % (ref 0–1)
Basophils Absolute: 0 10*3/uL (ref 0.0–0.1)
Eosinophils Absolute: 0.3 10*3/uL (ref 0.0–0.7)
Eosinophils Relative: 2 % (ref 0–5)
HCT: 34.2 % — ABNORMAL LOW (ref 36.0–46.0)
HEMOGLOBIN: 11.3 g/dL — AB (ref 12.0–15.0)
LYMPHS PCT: 15 % (ref 12–46)
Lymphs Abs: 2.2 10*3/uL (ref 0.7–4.0)
MCH: 28.3 pg (ref 26.0–34.0)
MCHC: 33 g/dL (ref 30.0–36.0)
MCV: 85.5 fL (ref 78.0–100.0)
MONO ABS: 0.8 10*3/uL (ref 0.1–1.0)
Monocytes Relative: 5 % (ref 3–12)
NEUTROS ABS: 11.4 10*3/uL — AB (ref 1.7–7.7)
Neutrophils Relative %: 78 % — ABNORMAL HIGH (ref 43–77)
PLATELETS: 182 10*3/uL (ref 150–400)
RBC: 4 MIL/uL (ref 3.87–5.11)
RDW: 15 % (ref 11.5–15.5)
WBC: 14.6 10*3/uL — ABNORMAL HIGH (ref 4.0–10.5)

## 2014-03-14 LAB — WET PREP, GENITAL
TRICH WET PREP: NONE SEEN
Yeast Wet Prep HPF POC: NONE SEEN

## 2014-03-14 LAB — URINALYSIS, ROUTINE W REFLEX MICROSCOPIC
Bilirubin Urine: NEGATIVE
GLUCOSE, UA: NEGATIVE mg/dL
Hgb urine dipstick: NEGATIVE
KETONES UR: NEGATIVE mg/dL
Leukocytes, UA: NEGATIVE
Nitrite: NEGATIVE
Protein, ur: NEGATIVE mg/dL
Specific Gravity, Urine: 1.025 (ref 1.005–1.030)
UROBILINOGEN UA: 2 mg/dL — AB (ref 0.0–1.0)
pH: 6.5 (ref 5.0–8.0)

## 2014-03-14 LAB — PREGNANCY, URINE: Preg Test, Ur: NEGATIVE

## 2014-03-14 MED ORDER — OXYCODONE-ACETAMINOPHEN 5-325 MG PO TABS
2.0000 | ORAL_TABLET | Freq: Once | ORAL | Status: AC
  Administered 2014-03-14: 2 via ORAL

## 2014-03-14 MED ORDER — OXYCODONE-ACETAMINOPHEN 5-325 MG PO TABS: 1.0000 | ORAL_TABLET | ORAL | Status: DC | PRN

## 2014-03-14 MED ORDER — IOHEXOL 300 MG/ML  SOLN
100.0000 mL | Freq: Once | INTRAMUSCULAR | Status: AC | PRN
  Administered 2014-03-14: 100 mL via INTRAVENOUS

## 2014-03-14 MED ORDER — METRONIDAZOLE 500 MG PO TABS: 500.0000 mg | ORAL_TABLET | Freq: Two times a day (BID) | ORAL | Status: DC

## 2014-03-14 MED ORDER — IOHEXOL 300 MG/ML  SOLN
50.0000 mL | Freq: Once | INTRAMUSCULAR | Status: AC | PRN
  Administered 2014-03-14: 50 mL via ORAL

## 2014-03-14 MED ORDER — OXYCODONE-ACETAMINOPHEN 5-325 MG PO TABS
ORAL_TABLET | ORAL | Status: AC
  Filled 2014-03-14: qty 2

## 2014-03-14 NOTE — ED Provider Notes (Signed)
CSN: [id number]     Arrival date & time 03/13/14  2123 History   First MD Initiated Contact with Patient 03/13/14 2259     Chief Complaint  Patient presents with  . Abdominal Pain     (Consider location/radiation/quality/duration/timing/severity/associated sxs/prior Treatment) Patient is a 24 y.o. female presenting with abdominal pain. The history is provided by the patient.  Abdominal Pain Pain location:  RLQ Pain quality: aching, gnawing and stabbing   Pain radiates to:  Back Pain severity:  Moderate Onset quality:  Gradual Duration:  3 days Timing:  Constant Progression:  Worsening Chronicity:  New Context: not alcohol use, not diet changes, not retching and not trauma   Relieved by:  None tried Worsened by:  Movement and position changes (walking) Ineffective treatments:  None tried Associated symptoms: dysuria and nausea   Associated symptoms: no chest pain, no chills, no constipation, no diarrhea, no fever, no hematuria, no shortness of breath, no sore throat, no vaginal bleeding, no vaginal discharge and no vomiting   Risk factors: not pregnant     Past Medical History  Diagnosis Date  . Bronchitis   . Migraine   . History of chlamydia   . Vaginal discharge 12/10/2012  . BV (bacterial vaginosis) 12/10/2012  . Dyspareunia 12/10/2012  . PID (pelvic inflammatory disease)   . History of PID 07/16/2013  . RUQ pain 07/16/2013  . Yeast infection 07/16/2013  . Contraceptive education 07/16/2013  . Nexplanon insertion 08/27/2013  . UTI (lower urinary tract infection)    Past Surgical History  Procedure Laterality Date  . Wisdom tooth extraction    . Dilation and curettage of uterus N/A 03/18/2012    Procedure: DILATATION AND CURETTAGE;  Surgeon: Mora Bellman, MD;  Location: [facility];  Service: Gynecology;  Laterality: N/A;  . Tooth pulled     Family History  Problem Relation Age of Onset  . Anesthesia problems Neg Hx   . Hypotension Neg Hx   . Malignant hyperthermia  Neg Hx   . Pseudochol deficiency Neg Hx   . Diabetes Cousin   . Hypertension Cousin   . Ulcers Cousin   . Heart attack Other    History  Substance Use Topics  . Smoking status: Former Smoker -- 0.20 packs/day for 2 years    Types: Cigarettes    Quit date: 08/12/2010  . Smokeless tobacco: Never Used  . Alcohol Use: 0.6 oz/week    1 Glasses of wine per week     Comment: occ   OB History    Gravida Para Term Preterm AB TAB SAB Ectopic Multiple Living   2 2 1 1  0 0 0 0 0 2     Review of Systems  Constitutional: Negative for fever and chills.  HENT: Negative for congestion and sore throat.   Eyes: Negative.   Respiratory: Negative for chest tightness and shortness of breath.   Cardiovascular: Negative for chest pain.  Gastrointestinal: Positive for nausea and abdominal pain. Negative for vomiting, diarrhea and constipation.  Genitourinary: Positive for dysuria. Negative for hematuria, flank pain, vaginal bleeding and vaginal discharge.  Musculoskeletal: Positive for back pain. Negative for joint swelling, arthralgias and neck pain.  Skin: Negative.  Negative for rash and wound.  Neurological: Negative for dizziness, weakness, light-headedness, numbness and headaches.  Psychiatric/Behavioral: Negative.       Allergies  Bee venom and Other  Home Medications   Prior to Admission medications   Medication Sig Start Date End Date Taking? Authorizing Provider  DiphenhydrAMINE HCl (BENADRYL ALLERGY PO) Take by mouth as needed.    Historical Provider, MD  etonogestrel (NEXPLANON) 68 MG IMPL implant Inject 1 each into the skin once.    Historical Provider, MD  lidocaine (XYLOCAINE) 5 % ointment Apply 1 application topically as needed. 10/07/13   Roma Schanz, CNM  megestrol (MEGACE) 40 MG tablet Take 3 a day for 5 days, 2 a day for 5 days then 1 a day 06/11/13   Estill Dooms, NP  metroNIDAZOLE (FLAGYL) 500 MG tablet Take 4 tablets (2,000 mg total) by mouth once. Do not  drink alcohol while taking medicine! 10/07/13   Roma Schanz, CNM  predniSONE (DELTASONE) 20 MG tablet Take 2 tablets (40 mg total) by mouth daily with breakfast. X 7 days 10/07/13   Roma Schanz, CNM  SUMAtriptan (IMITREX) 50 MG tablet Take 50 mg by mouth as needed for migraine or headache. May repeat in 2 hours if headache persists or recurs.    Historical Provider, MD   BP 96/59 mmHg  Pulse 83  Temp(Src) 98.9 F (37.2 C) (Oral)  Resp 18  Ht 5' 7.75" (1.721 m)  Wt 142 lb 9.6 oz (64.683 kg)  BMI 21.84 kg/m2  SpO2 100% Physical Exam  Constitutional: She appears well-developed and well-nourished.  HENT:  Head: Normocephalic and atraumatic.  Eyes: Conjunctivae are normal.  Neck: Normal range of motion.  Cardiovascular: Normal rate, regular rhythm, normal heart sounds and intact distal pulses.   Pulmonary/Chest: Effort normal and breath sounds normal. She has no wheezes.  Abdominal: Soft. Bowel sounds are normal. There is tenderness in the right lower quadrant. There is tenderness at McBurney's point. There is no rebound, no guarding and no CVA tenderness.  Genitourinary: Uterus normal. Cervix exhibits no motion tenderness and no discharge. Right adnexum displays no mass, no tenderness and no fullness. Left adnexum displays no mass, no tenderness and no fullness. No erythema in the vagina. Vaginal discharge found.  Musculoskeletal: Normal range of motion.  Neurological: She is alert.  Skin: Skin is warm and dry.  Psychiatric: She has a normal mood and affect.  Nursing note and vitals reviewed.   ED Course  Procedures (including critical care time) Labs Review Labs Reviewed  WET PREP, GENITAL - Abnormal; Notable for the following:    Clue Cells Wet Prep HPF POC TOO NUMEROUS TO COUNT (*)    WBC, Wet Prep HPF POC FEW (*)    All other components within normal limits  URINALYSIS, ROUTINE W REFLEX MICROSCOPIC - Abnormal; Notable for the following:    Urobilinogen, UA 2.0 (*)     All other components within normal limits  CBC WITH DIFFERENTIAL/PLATELET - Abnormal; Notable for the following:    WBC 14.6 (*)    Hemoglobin 11.3 (*)    HCT 34.2 (*)    Neutrophils Relative % 78 (*)    Neutro Abs 11.4 (*)    All other components within normal limits  BASIC METABOLIC PANEL - Abnormal; Notable for the following:    Glucose, Bld 102 (*)    All other components within normal limits  PREGNANCY, URINE  GC/CHLAMYDIA PROBE AMP ([facility])    Imaging Review No results found.   EKG Interpretation None      MDM   Final diagnoses:  Abdominal pain  Bacterial vaginosis    Pt with rlq pain, leukocytosis, bacterial vaginosis, pending Ct scant to assess for appendicitis or other source of RLQ pain. Plan to dispo home  with flagyl for bv.  Discussed with Dr. Zenia Resides who will follow pt and dispo once Ct scan results.    Evalee Jefferson, PA-C 03/14/14 [id number]  Lacretia Leigh, MD 03/14/14 [phone]

## 2014-03-14 NOTE — Discharge Instructions (Signed)
Abdominal Pain, Women °Abdominal (stomach, pelvic, or belly) pain can be caused by many things. It is important to tell your doctor: °· The location of the pain. °· Does it come and go or is it present all the time? °· Are there things that start the pain (eating certain foods, exercise)? °· Are there other symptoms associated with the pain (fever, nausea, vomiting, diarrhea)? °All of this is helpful to know when trying to find the cause of the pain. °CAUSES  °· Stomach: virus or bacteria infection, or ulcer. °· Intestine: appendicitis (inflamed appendix), regional ileitis (Crohn's disease), ulcerative colitis (inflamed colon), irritable bowel syndrome, diverticulitis (inflamed diverticulum of the colon), or cancer of the stomach or intestine. °· Gallbladder disease or stones in the gallbladder. °· Kidney disease, kidney stones, or infection. °· Pancreas infection or cancer. °· Fibromyalgia (pain disorder). °· Diseases of the female organs: °· Uterus: fibroid (non-cancerous) tumors or infection. °· Fallopian tubes: infection or tubal pregnancy. °· Ovary: cysts or tumors. °· Pelvic adhesions (scar tissue). °· Endometriosis (uterus lining tissue growing in the pelvis and on the pelvic organs). °· Pelvic congestion syndrome (female organs filling up with blood just before the menstrual period). °· Pain with the menstrual period. °· Pain with ovulation (producing an egg). °· Pain with an IUD (intrauterine device, birth control) in the uterus. °· Cancer of the female organs. °· Functional pain (pain not caused by a disease, may improve without treatment). °· Psychological pain. °· Depression. °DIAGNOSIS  °Your doctor will decide the seriousness of your pain by doing an examination. °· Blood tests. °· X-rays. °· Ultrasound. °· CT scan (computed tomography, special type of X-ray). °· MRI (magnetic resonance imaging). °· Cultures, for infection. °· Barium enema (dye inserted in the large intestine, to better view it with  X-rays). °· Colonoscopy (looking in intestine with a lighted tube). °· Laparoscopy (minor surgery, looking in abdomen with a lighted tube). °· Major abdominal exploratory surgery (looking in abdomen with a large incision). °TREATMENT  °The treatment will depend on the cause of the pain.  °· Many cases can be observed and treated at home. °· Over-the-counter medicines recommended by your caregiver. °· Prescription medicine. °· Antibiotics, for infection. °· Birth control pills, for painful periods or for ovulation pain. °· Hormone treatment, for endometriosis. °· Nerve blocking injections. °· Physical therapy. °· Antidepressants. °· Counseling with a psychologist or psychiatrist. °· Minor or major surgery. °HOME CARE INSTRUCTIONS  °· Do not take laxatives, unless directed by your caregiver. °· Take over-the-counter pain medicine only if ordered by your caregiver. Do not take aspirin because it can cause an upset stomach or bleeding. °· Try a clear liquid diet (broth or water) as ordered by your caregiver. Slowly move to a bland diet, as tolerated, if the pain is related to the stomach or intestine. °· Have a thermometer and take your temperature several times a day, and record it. °· Bed rest and sleep, if it helps the pain. °· Avoid sexual intercourse, if it causes pain. °· Avoid stressful situations. °· Keep your follow-up appointments and tests, as your caregiver orders. °· If the pain does not go away with medicine or surgery, you may try: °· Acupuncture. °· Relaxation exercises (yoga, meditation). °· Group therapy. °· Counseling. °SEEK MEDICAL CARE IF:  °· You notice certain foods cause stomach pain. °· Your home care treatment is not helping your pain. °· You need stronger pain medicine. °· You want your IUD removed. °· You feel faint or   lightheaded. °· You develop nausea and vomiting. °· You develop a rash. °· You are having side effects or an allergy to your medicine. °SEEK IMMEDIATE MEDICAL CARE IF:  °· Your  pain does not go away or gets worse. °· You have a fever. °· Your pain is felt only in portions of the abdomen. The right side could possibly be appendicitis. The left lower portion of the abdomen could be colitis or diverticulitis. °· You are passing blood in your stools (bright red or black tarry stools, with or without vomiting). °· You have blood in your urine. °· You develop chills, with or without a fever. °· You pass out. °MAKE SURE YOU:  °· Understand these instructions. °· Will watch your condition. °· Will get help right away if you are not doing well or get worse. °Document Released: 10/15/2006 Document Revised: 05/04/2013 Document Reviewed: 11/04/2008 °ExitCare® Patient Information ©2015 [employer]. This information is not intended to replace advice given to you by your health care provider. Make sure you discuss any questions you have with your health care provider. °Bacterial Vaginosis °Bacterial vaginosis is a vaginal infection that occurs when the normal balance of bacteria in the vagina is disrupted. It results from an overgrowth of certain bacteria. This is the most common vaginal infection in women of childbearing age. Treatment is important to prevent complications, especially in pregnant women, as it can cause a premature delivery. °CAUSES  °Bacterial vaginosis is caused by an increase in harmful bacteria that are normally present in smaller amounts in the vagina. Several different kinds of bacteria can cause bacterial vaginosis. However, the reason that the condition develops is not fully understood. °RISK FACTORS °Certain activities or behaviors can put you at an increased risk of developing bacterial vaginosis, including: °· Having a new sex partner or multiple sex partners. °· Douching. °· Using an intrauterine device (IUD) for contraception. °Women do not get bacterial vaginosis from toilet seats, bedding, swimming pools, or contact with objects around them. °SIGNS AND SYMPTOMS  °Some  women with bacterial vaginosis have no signs or symptoms. Common symptoms include: °· Grey vaginal discharge. °· A fishlike odor with discharge, especially after sexual intercourse. °· Itching or burning of the vagina and vulva. °· Burning or pain with urination. °DIAGNOSIS  °Your health care provider will take a medical history and examine the vagina for signs of bacterial vaginosis. A sample of vaginal fluid may be taken. Your health care provider will look at this sample under a microscope to check for bacteria and abnormal cells. A vaginal pH test may also be done.  °TREATMENT  °Bacterial vaginosis may be treated with antibiotic medicines. These may be given in the form of a pill or a vaginal cream. A second round of antibiotics may be prescribed if the condition comes back after treatment.  °HOME CARE INSTRUCTIONS  °· Only take over-the-counter or prescription medicines as directed by your health care provider. °· If antibiotic medicine was prescribed, take it as directed. Make sure you finish it even if you start to feel better. °· Do not have sex until treatment is completed. °· Tell all sexual partners that you have a vaginal infection. They should see their health care provider and be treated if they have problems, such as a mild rash or itching. °· Practice safe sex by using condoms and only having one sex partner. °SEEK MEDICAL CARE IF:  °· Your symptoms are not improving after 3 days of treatment. °· You have increased discharge   or pain. °· You have a fever. °MAKE SURE YOU:  °· Understand these instructions. °· Will watch your condition. °· Will get help right away if you are not doing well or get worse. °FOR MORE INFORMATION  °Centers for Disease Control and Prevention, Division of STD Prevention: www.cdc.gov/std °American Sexual Health Association (ASHA): www.ashastd.org  °Document Released: 12/18/2004 Document Revised: 10/08/2012 Document Reviewed: 07/30/2012 °ExitCare® Patient Information ©2015  [employer]. This information is not intended to replace advice given to you by your health care provider. Make sure you discuss any questions you have with your health care provider. ° °

## 2014-03-15 LAB — GC/CHLAMYDIA PROBE AMP (~~LOC~~) NOT AT ARMC
CHLAMYDIA, DNA PROBE: NEGATIVE
NEISSERIA GONORRHEA: POSITIVE — AB

## 2014-03-21 ENCOUNTER — Telehealth (HOSPITAL_COMMUNITY): Payer: Self-pay

## 2014-03-21 NOTE — Telephone Encounter (Signed)
Results received from [facility].  (+) Gonorrhea  No antibiotic treatment or Rx given for STD.  Chart to MD office for review.  DHHS form attached.

## 2014-03-23 ENCOUNTER — Telehealth (HOSPITAL_BASED_OUTPATIENT_CLINIC_OR_DEPARTMENT_OTHER): Payer: Self-pay | Admitting: Emergency Medicine

## 2014-03-23 ENCOUNTER — Telehealth (HOSPITAL_COMMUNITY): Payer: Self-pay

## 2014-03-23 NOTE — Telephone Encounter (Signed)
Chart reviewed by C. Lawyer PA pt needs "Suprax 400 mg po #1"  And reviewed by Dr Keturah Barre. Lori Johnston pt also needs "Azithromycin 1 gram po x 1"

## 2014-03-24 ENCOUNTER — Telehealth: Payer: Self-pay | Admitting: Emergency Medicine

## 2014-03-25 ENCOUNTER — Telehealth (HOSPITAL_BASED_OUTPATIENT_CLINIC_OR_DEPARTMENT_OTHER): Payer: Self-pay | Admitting: *Deleted

## 2014-04-21 ENCOUNTER — Emergency Department (HOSPITAL_COMMUNITY)
Admission: EM | Admit: 2014-04-21 | Discharge: 2014-04-22 | Disposition: A | Discharge status: Home / Self Care | Payer: Medicaid Other | Attending: Emergency Medicine | Admitting: Emergency Medicine

## 2014-04-21 ENCOUNTER — Encounter (HOSPITAL_COMMUNITY): Payer: Self-pay | Admitting: Emergency Medicine

## 2014-04-21 DIAGNOSIS — Z8742 Personal history of other diseases of the female genital tract: Secondary | ICD-10-CM | POA: Diagnosis not present

## 2014-04-21 DIAGNOSIS — Z8619 Personal history of other infectious and parasitic diseases: Secondary | ICD-10-CM | POA: Diagnosis not present

## 2014-04-21 DIAGNOSIS — Z8679 Personal history of other diseases of the circulatory system: Secondary | ICD-10-CM | POA: Insufficient documentation

## 2014-04-21 DIAGNOSIS — Z87440 Personal history of urinary (tract) infections: Secondary | ICD-10-CM | POA: Diagnosis not present

## 2014-04-21 DIAGNOSIS — Z793 Long term (current) use of hormonal contraceptives: Secondary | ICD-10-CM | POA: Insufficient documentation

## 2014-04-21 DIAGNOSIS — Z87891 Personal history of nicotine dependence: Secondary | ICD-10-CM | POA: Insufficient documentation

## 2014-04-21 DIAGNOSIS — R51 Headache: Secondary | ICD-10-CM | POA: Diagnosis not present

## 2014-04-21 DIAGNOSIS — M436 Torticollis: Secondary | ICD-10-CM | POA: Insufficient documentation

## 2014-04-21 DIAGNOSIS — M542 Cervicalgia: Secondary | ICD-10-CM | POA: Diagnosis present

## 2014-04-21 DIAGNOSIS — Z8709 Personal history of other diseases of the respiratory system: Secondary | ICD-10-CM | POA: Diagnosis not present

## 2014-04-21 DIAGNOSIS — Z79899 Other long term (current) drug therapy: Secondary | ICD-10-CM | POA: Diagnosis not present

## 2014-04-21 MED ORDER — DIAZEPAM 5 MG PO TABS
5.0000 mg | ORAL_TABLET | Freq: Once | ORAL | Status: AC
  Administered 2014-04-21: 5 mg via ORAL
  Filled 2014-04-21: qty 1

## 2014-04-21 MED ORDER — METHOCARBAMOL 500 MG PO TABS
1000.0000 mg | ORAL_TABLET | Freq: Three times a day (TID) | ORAL | Status: DC
Start: 2014-04-21 — End: 2014-05-26

## 2014-04-21 MED ORDER — KETOROLAC TROMETHAMINE 10 MG PO TABS
10.0000 mg | ORAL_TABLET | Freq: Once | ORAL | Status: AC
  Administered 2014-04-21: 10 mg via ORAL
  Filled 2014-04-21: qty 1

## 2014-04-21 MED ORDER — CELECOXIB 100 MG PO CAPS: 100.0000 mg | ORAL_CAPSULE | Freq: Two times a day (BID) | ORAL | Status: DC

## 2014-04-21 NOTE — ED Notes (Signed)
Patient c/o left sided neck stiffness x 2 days.

## 2014-04-21 NOTE — ED Provider Notes (Signed)
CSN: [id number]     Arrival date & time 04/21/14  2003 History   First MD Initiated Contact with Patient 04/21/14 2256     Chief Complaint  Patient presents with  . Torticollis     (Consider location/radiation/quality/duration/timing/severity/associated sxs/prior Treatment) HPI Comments: Woke up 2 days ago with left neck pain  Patient is a 24 y.o. female presenting with neck injury. The history is provided by the patient.  Neck Injury This is a new problem. The current episode started yesterday. The problem occurs intermittently. The problem has been gradually worsening. Associated symptoms include headaches and neck pain. Pertinent negatives include no numbness or weakness. Exacerbated by: movement and palpation. She has tried nothing for the symptoms. The treatment provided no relief.    Past Medical History  Diagnosis Date  . Bronchitis   . Migraine   . History of chlamydia   . Vaginal discharge 12/10/2012  . BV (bacterial vaginosis) 12/10/2012  . Dyspareunia 12/10/2012  . PID (pelvic inflammatory disease)   . History of PID 07/16/2013  . RUQ pain 07/16/2013  . Yeast infection 07/16/2013  . Contraceptive education 07/16/2013  . Nexplanon insertion 08/27/2013  . UTI (lower urinary tract infection)    Past Surgical History  Procedure Laterality Date  . Wisdom tooth extraction    . Dilation and curettage of uterus N/A 03/18/2012    Procedure: DILATATION AND CURETTAGE;  Surgeon: Mora Bellman, MD;  Location: [facility];  Service: Gynecology;  Laterality: N/A;  . Tooth pulled     Family History  Problem Relation Age of Onset  . Anesthesia problems Neg Hx   . Hypotension Neg Hx   . Malignant hyperthermia Neg Hx   . Pseudochol deficiency Neg Hx   . Diabetes Cousin   . Hypertension Cousin   . Ulcers Cousin   . Heart attack Other    History  Substance Use Topics  . Smoking status: Former Smoker -- 0.20 packs/day for 2 years    Types: Cigarettes    Quit date: 08/12/2010  .  Smokeless tobacco: Never Used  . Alcohol Use: 0.6 oz/week    1 Glasses of wine per week     Comment: occ   OB History    Gravida Para Term Preterm AB TAB SAB Ectopic Multiple Living   2 2 1 1  0 0 0 0 0 2     Review of Systems  Musculoskeletal: Positive for neck pain.  Neurological: Positive for headaches. Negative for weakness and numbness.  All other systems reviewed and are negative.     Allergies  Bee venom and Other  Home Medications   Prior to Admission medications   Medication Sig Start Date End Date Taking? Authorizing Provider  traMADol (ULTRAM) 50 MG tablet Take 50 mg by mouth every 6 (six) hours as needed (pain).   Yes Historical Provider, MD  etonogestrel (NEXPLANON) 68 MG IMPL implant Inject 1 each into the skin once.    Historical Provider, MD  lidocaine (XYLOCAINE) 5 % ointment Apply 1 application topically as needed. Patient not taking: Reported on 04/21/2014 10/07/13   Roma Schanz, CNM  megestrol (MEGACE) 40 MG tablet Take 3 a day for 5 days, 2 a day for 5 days then 1 a day Patient not taking: Reported on 04/21/2014 06/11/13   Estill Dooms, NP  metroNIDAZOLE (FLAGYL) 500 MG tablet Take 1 tablet (500 mg total) by mouth 2 (two) times daily. Patient not taking: Reported on 04/21/2014 03/14/14   Lacretia Leigh,  MD  oxyCODONE-acetaminophen (PERCOCET/ROXICET) 5-325 MG per tablet Take 1-2 tablets by mouth every 4 (four) hours as needed for severe pain. Patient not taking: Reported on 04/21/2014 03/14/14   Lacretia Leigh, MD  predniSONE (DELTASONE) 20 MG tablet Take 2 tablets (40 mg total) by mouth daily with breakfast. X 7 days Patient not taking: Reported on 04/21/2014 10/07/13   Roma Schanz, CNM   BP 116/53 mmHg  Pulse 80  Temp(Src) 98.3 F (36.8 C) (Oral)  Resp 24  Ht 5\' 8"  (1.727 m)  Wt 138 lb (62.596 kg)  BMI 20.99 kg/m2  SpO2 100% Physical Exam  Constitutional: She is oriented to person, place, and time. She appears well-developed and  well-nourished.  Non-toxic appearance.  HENT:  Head: Normocephalic.  Right Ear: Tympanic membrane and external ear normal.  Left Ear: Tympanic membrane and external ear normal.  Eyes: EOM and lids are normal. Pupils are equal, round, and reactive to light.  Neck: Normal range of motion. Neck supple. Carotid bruit is not present.  Cardiovascular: Normal rate, regular rhythm, normal heart sounds, intact distal pulses and normal pulses.   Pulmonary/Chest: Breath sounds normal. No respiratory distress.  Abdominal: Soft. Bowel sounds are normal. There is no tenderness. There is no guarding.  Musculoskeletal:       Cervical back: She exhibits decreased range of motion, pain and spasm. She exhibits no deformity.  Lymphadenopathy:       Head (right side): No submandibular adenopathy present.       Head (left side): No submandibular adenopathy present.    She has no cervical adenopathy.  Neurological: She is alert and oriented to person, place, and time. She has normal strength. No cranial nerve deficit or sensory deficit.  Skin: Skin is warm and dry.  Psychiatric: She has a normal mood and affect. Her speech is normal.  Nursing note and vitals reviewed.   ED Course  Procedures (including critical care time) Labs Review Labs Reviewed - No data to display  Imaging Review No results found.   EKG Interpretation None      MDM  Vital signs are well within normal limits. There is no gross neurologic deficit appreciated. The examination of favors torticollis. Patient was treated in the emergency department with diazepam and Toradol. Prescription for Celebrex and Robaxin given to the patient. Patient is to follow-up with orthopedics if not improving.    Final diagnoses:  None    **I have reviewed nursing notes, vital signs, and all appropriate lab and imaging results for this patient.Lily Kocher, PA-C 04/23/14 [place], DO 04/24/14 1527

## 2014-04-21 NOTE — Discharge Instructions (Signed)

## 2014-04-24 ENCOUNTER — Emergency Department (HOSPITAL_COMMUNITY)
Admission: EM | Admit: 2014-04-24 | Discharge: 2014-04-24 | Disposition: A | Discharge status: Home / Self Care | Payer: Medicaid Other | Attending: Emergency Medicine | Admitting: Emergency Medicine

## 2014-04-24 ENCOUNTER — Emergency Department (HOSPITAL_COMMUNITY): Payer: Medicaid Other

## 2014-04-24 ENCOUNTER — Encounter (HOSPITAL_COMMUNITY): Payer: Self-pay | Admitting: Emergency Medicine

## 2014-04-24 DIAGNOSIS — Y9389 Activity, other specified: Secondary | ICD-10-CM | POA: Insufficient documentation

## 2014-04-24 DIAGNOSIS — Z8679 Personal history of other diseases of the circulatory system: Secondary | ICD-10-CM | POA: Diagnosis not present

## 2014-04-24 DIAGNOSIS — Z8619 Personal history of other infectious and parasitic diseases: Secondary | ICD-10-CM | POA: Diagnosis not present

## 2014-04-24 DIAGNOSIS — Z8709 Personal history of other diseases of the respiratory system: Secondary | ICD-10-CM | POA: Diagnosis not present

## 2014-04-24 DIAGNOSIS — Z87891 Personal history of nicotine dependence: Secondary | ICD-10-CM | POA: Insufficient documentation

## 2014-04-24 DIAGNOSIS — W228XXA Striking against or struck by other objects, initial encounter: Secondary | ICD-10-CM | POA: Insufficient documentation

## 2014-04-24 DIAGNOSIS — S6991XA Unspecified injury of right wrist, hand and finger(s), initial encounter: Secondary | ICD-10-CM | POA: Diagnosis present

## 2014-04-24 DIAGNOSIS — Y998 Other external cause status: Secondary | ICD-10-CM | POA: Diagnosis not present

## 2014-04-24 DIAGNOSIS — L0889 Other specified local infections of the skin and subcutaneous tissue: Secondary | ICD-10-CM | POA: Insufficient documentation

## 2014-04-24 DIAGNOSIS — Z87440 Personal history of urinary (tract) infections: Secondary | ICD-10-CM | POA: Diagnosis not present

## 2014-04-24 DIAGNOSIS — L089 Local infection of the skin and subcutaneous tissue, unspecified: Secondary | ICD-10-CM

## 2014-04-24 DIAGNOSIS — S60511A Abrasion of right hand, initial encounter: Secondary | ICD-10-CM | POA: Diagnosis not present

## 2014-04-24 DIAGNOSIS — Z8742 Personal history of other diseases of the female genital tract: Secondary | ICD-10-CM | POA: Diagnosis not present

## 2014-04-24 DIAGNOSIS — Y9289 Other specified places as the place of occurrence of the external cause: Secondary | ICD-10-CM | POA: Insufficient documentation

## 2014-04-24 DIAGNOSIS — Z79899 Other long term (current) drug therapy: Secondary | ICD-10-CM | POA: Diagnosis not present

## 2014-04-24 DIAGNOSIS — S63501A Unspecified sprain of right wrist, initial encounter: Secondary | ICD-10-CM | POA: Insufficient documentation

## 2014-04-24 MED ORDER — PREDNISONE 10 MG PO TABS: ORAL_TABLET | ORAL | Status: DC
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
## 2014-04-24 NOTE — Discharge Instructions (Signed)
Wrist Splint A wrist splint is a brace that holds your wrist in a fixed position. It can be used to stabilize your wrist so that broken bones and sprains can heal faster, with less pain. It can also help to relieve pressure on the nerve that runs down the middle of your arm (median nerve). Splints are available in drugstores without a prescription. They are also available by prescription from orthopedic and medical supply stores. Custom splints made from lightweight materials can be made by physical or occupational therapist. Maxwell your splint as instructed by your caregiver. It may be worn while you sleep.  Your caregiver may instruct you how to perform certain exercises at home. These exercises help maintain muscle strength in your hand and wrist. They also help to maintain motion in your fingers. SEEK MEDICAL CARE IF:  You start to lose feeling in your hand or fingers.  Your skin or fingernails turn blue or gray, or they feel cold. MAKE SURE YOU:   Understand these instructions.  Will watch your condition.  Will get help right away if you are not doing well or get worse. Document Released: 11/30/2005 Document Revised: 03/12/2011 Document Reviewed: 03/31/2013 [facility] Patient Information 2015 [place], [place]. This information is not intended to replace advice given to you by your health care provider. Make sure you discuss any questions you have with your health care provider.   Continue using ice and elevation for pain and swelling.  Use the prednisone taper prescribed for pain and inflammation reduction.  Your xrays are negative for fracture or dislocation.

## 2014-05-13 ENCOUNTER — Encounter: Payer: Self-pay | Admitting: Gastroenterology

## 2014-05-26 ENCOUNTER — Ambulatory Visit (INDEPENDENT_AMBULATORY_CARE_PROVIDER_SITE_OTHER): Payer: Medicaid Other | Admitting: Nurse Practitioner

## 2014-05-26 ENCOUNTER — Encounter: Payer: Self-pay | Admitting: Nurse Practitioner

## 2014-05-26 VITALS — BP 124/75 | HR 77 | Temp 97.7°F | Ht 68.0 in | Wt 133.4 lb

## 2014-05-26 DIAGNOSIS — R1013 Epigastric pain: Secondary | ICD-10-CM | POA: Insufficient documentation

## 2014-05-26 DIAGNOSIS — R109 Unspecified abdominal pain: Secondary | ICD-10-CM

## 2014-05-26 DIAGNOSIS — R110 Nausea: Secondary | ICD-10-CM | POA: Insufficient documentation

## 2014-05-26 MED ORDER — OMEPRAZOLE 20 MG PO CPDR: 20.0000 mg | DELAYED_RELEASE_CAPSULE | Freq: Every day | ORAL | Status: DC

## 2014-05-26 MED ORDER — ONDANSETRON HCL 4 MG PO TABS: 4.0000 mg | ORAL_TABLET | Freq: Three times a day (TID) | ORAL | Status: DC | PRN

## 2014-05-26 NOTE — Progress Notes (Signed)
Primary Care Physician:  Jana Half Primary Gastroenterologist:  Dr. Oneida Alar  Chief Complaint  Patient presents with  . Abdominal Pain  . loss of appetite    HPI:   24 year old female presents on referral from PCP for abdominal pain. PCP note reviewed. At her last office visit with her primary care provider, dated 05/04/2014, patient reported decreased appetite, 4 weeks of abdominal pain which she described as sudden onset and severe located in a generalized fashion which is getting worse, occurring daily, nothing alleviated or aggravated. Also with nausea. PCP assessment with epigastric abdominal pain and referred to GI further evaluation. Review of ED notes finds patient was evaluated on 03/13/2014 for right lower quadrant pain. Noted to have a vaginal discharge and test results came back positive for gonorrhea. Was treated appropriately with Suprax and azithromycin. CT abdomen and pelvis with contrast performed in the ED found appendix appears normal, no inflammatory changes in the right lower quadrant to suggest appendicitis. Also found dilated left ovarian veins with prominent periuterine and left adnexal vascularity suggestive of pelvic congestion syndrome. Noted 2.1 cm left adnexal cyst. Patient does not appear to been evaluated by OB/GYN.   Today she states her pain began October 2015 (6-7 months duration.) Pain is periumbilical up through epigastric area. Also with occasional sharp pains LLQ. Has had appendix, gallbladder evaluations which were normal. Last saw OB/GYN before current symptom onset. Pain is worse when she lays down at night. Pain is "much worse" around the time of menstruation. Also with decreased appetite. Admits occasional nausea. Denies esophageal burning. Admits occasional nocturnal choking and morning bitter taste. Denies fever. Has had some weight loss of approximately 15 pounds in 6 months but attributes this to not eating much. Denies hematochezia and  melena. Abdominal pain is no better or worse with bowel movement. Denies any other upper or lower GI symptoms.  Past Medical History  Diagnosis Date  . Bronchitis   . Migraine   . History of chlamydia   . Vaginal discharge 12/10/2012  . BV (bacterial vaginosis) 12/10/2012  . Dyspareunia 12/10/2012  . PID (pelvic inflammatory disease)   . History of PID 07/16/2013  . RUQ pain 07/16/2013  . Yeast infection 07/16/2013  . Contraceptive education 07/16/2013  . Nexplanon insertion 08/27/2013  . UTI (lower urinary tract infection)     Past Surgical History  Procedure Laterality Date  . Wisdom tooth extraction    . Dilation and curettage of uterus N/A 03/18/2012    Procedure: DILATATION AND CURETTAGE;  Surgeon: Mora Bellman, MD;  Location: [facility];  Service: Gynecology;  Laterality: N/A;  . Tooth pulled      Current Outpatient Prescriptions  Medication Sig Dispense Refill  . celecoxib (CELEBREX) 100 MG capsule Take 1 capsule (100 mg total) by mouth 2 (two) times daily. 14 capsule 0  . etonogestrel (NEXPLANON) 68 MG IMPL implant Inject 1 each into the skin once.    . Pediatric Multiple Vit-C-FA (FLINSTONES GUMMIES OMEGA-3 DHA PO) Take 1 tablet by mouth daily.    Marland Kitchen lidocaine (XYLOCAINE) 5 % ointment Apply 1 application topically as needed. (Patient not taking: Reported on 04/21/2014) 35.44 g 0  . megestrol (MEGACE) 40 MG tablet Take 3 a day for 5 days, 2 a day for 5 days then 1 a day (Patient not taking: Reported on 05/26/2014) 45 tablet 1  . methocarbamol (ROBAXIN) 500 MG tablet Take 2 tablets (1,000 mg total) by mouth 3 (three) times daily. (Patient not taking:  Reported on 04/24/2014) 30 tablet 0  . metroNIDAZOLE (FLAGYL) 500 MG tablet Take 1 tablet (500 mg total) by mouth 2 (two) times daily. (Patient not taking: Reported on 04/21/2014) 14 tablet 0  . oxyCODONE-acetaminophen (PERCOCET/ROXICET) 5-325 MG per tablet Take 1-2 tablets by mouth every 4 (four) hours as needed for severe pain. (Patient  not taking: Reported on 04/21/2014) 10 tablet 0  . predniSONE (DELTASONE) 10 MG tablet 6, 5, 4, 3, 2 then 1 tablet by mouth daily for 6 days total. (Patient not taking: Reported on 05/26/2014) 21 tablet 0   No current facility-administered medications for this visit.    Allergies as of 05/26/2014 - Review Complete 05/26/2014  Allergen Reaction Noted  . Bee venom Anaphylaxis 10/08/2010  . Other Anaphylaxis and Rash 07/30/2010    Family History  Problem Relation Age of Onset  . Anesthesia problems Neg Hx   . Hypotension Neg Hx   . Malignant hyperthermia Neg Hx   . Pseudochol deficiency Neg Hx   . Diabetes Cousin   . Hypertension Cousin   . Ulcers Cousin   . Heart attack Other     History   Social History  . Marital Status: Single    Spouse Name: N/A  . Number of Children: N/A  . Years of Education: N/A   Occupational History  . Not on file.   Social History Main Topics  . Smoking status: Light Tobacco Smoker -- 0.20 packs/day for 2 years    Types: Cigarettes    Last Attempt to Quit: 08/12/2010  . Smokeless tobacco: Never Used     Comment: occasionally smokes weed so she can eat  . Alcohol Use: 0.6 oz/week    1 Glasses of wine per week     Comment: occ  . Drug Use: No     Comment: denies use 03/13/14  . Sexual Activity: Yes    Birth Control/ Protection: Implant     Comment: hx chlamydia during 1st preg; was treated   Other Topics Concern  . Not on file   Social History Narrative    Review of Systems: General: Negative for anorexia, fever, chills, fatigue, weakness. ENT: Negative for hoarseness, difficulty swallowing. CV: Negative for chest pain, angina, palpitations, peripheral edema.  Respiratory: Negative for dyspnea at rest, cough, wheezing.  GI: See history of present illness. Derm: Negative for rash or itching.  Neuro: Negative for weakness, memory loss, confusion.  Psych: Negative for anxiety, depression.  Endo: Negative for unusual weight change.    Heme: Negative for bruising or bleeding. Allergy: Negative for rash or hives.    Physical Exam: BP 124/75 mmHg  Pulse 77  Temp(Src) 97.7 F (36.5 C)  Ht 5\' 8"  (1.727 m)  Wt 133 lb 6.4 oz (60.51 kg)  BMI 20.29 kg/m2  LMP 05/17/2014 General:   Alert and oriented. Pleasant and cooperative. Well-nourished and well-developed.  Head:  Normocephalic and atraumatic. Eyes:  Without icterus, sclera clear and conjunctiva pink.  Ears:  Normal auditory acuity. Cardiovascular:  S1, S2 present without murmurs appreciated. Normal pulses noted. Extremities without clubbing or edema. Respiratory:  Clear to auscultation bilaterally. No wheezes, rales, or rhonchi. No distress.  Gastrointestinal:  +BS, soft, and non-distended. Lower abdominal TTP. No HSM noted. No guarding or rebound. No masses appreciated.  Rectal:  Deferred  Musculoskalatal:  Symmetrical without gross deformities. Normal posture. Neurologic:  Alert and oriented x4;  grossly normal neurologically. Psych:  Alert and cooperative. Normal mood and affect.    05/26/2014 2:28  PM

## 2014-05-26 NOTE — Patient Instructions (Signed)
1. I send in a prescription for Zofran to your pharmacy. You can take this up to 4 times a day as needed for nausea. 2. I send in a prescription for Prilosec 20 mg. Take this every evening 30 minutes before your last meals a day. 3. Call and schedule a follow-up visit with her OB/GYN provider for further evaluation of your abdominal pain given the results of your CT suggesting that this is a female reproductive issue. 4. Return for follow-up in 3 months to evaluate your acid reflux symptoms.

## 2014-05-28 ENCOUNTER — Ambulatory Visit (INDEPENDENT_AMBULATORY_CARE_PROVIDER_SITE_OTHER): Payer: Medicaid Other | Admitting: Adult Health

## 2014-05-28 ENCOUNTER — Encounter: Payer: Self-pay | Admitting: Adult Health

## 2014-05-28 VITALS — BP 100/60 | HR 80 | Ht 68.75 in | Wt 134.5 lb

## 2014-05-28 DIAGNOSIS — Z3202 Encounter for pregnancy test, result negative: Secondary | ICD-10-CM | POA: Diagnosis not present

## 2014-05-28 DIAGNOSIS — R1033 Periumbilical pain: Secondary | ICD-10-CM

## 2014-05-28 DIAGNOSIS — N939 Abnormal uterine and vaginal bleeding, unspecified: Secondary | ICD-10-CM | POA: Diagnosis not present

## 2014-05-28 DIAGNOSIS — R630 Anorexia: Secondary | ICD-10-CM | POA: Diagnosis not present

## 2014-05-28 HISTORY — DX: Anorexia: R63.0

## 2014-05-28 HISTORY — DX: Abnormal uterine and vaginal bleeding, unspecified: N93.9

## 2014-05-28 LAB — POCT URINE PREGNANCY: PREG TEST UR: NEGATIVE

## 2014-05-28 MED ORDER — OMEPRAZOLE 20 MG PO CPDR: 20.0000 mg | DELAYED_RELEASE_CAPSULE | Freq: Every day | ORAL | Status: DC

## 2014-05-28 MED ORDER — MEGESTROL ACETATE 40 MG PO TABS: ORAL_TABLET | ORAL | Status: DC

## 2014-05-28 NOTE — Progress Notes (Signed)
Subjective:     Patient ID: Lori Johnston, female   DOB: 17-Apr-1990, 24 y.o.   MRN: [id number]  HPI Elliot is a 24 year old black female in complaining of bleeding x 1 month, has nexplanon and complains of  decreased appetite and abdominal pain, the abdominal pain is worse after eating, has seen PCP and GI.Was treated recently for GC.Had CT that showed small  Adnexal cyst and dilated vessels.  Review of Systems  Patient denies any headaches, hearing loss, fatigue, blurred vision, shortness of breath, chest pain, problems with bowel movements, urination, or intercourse. No joint pain or mood swings.See HPI for positives.  Reviewed past medical,surgical, social and family history. Reviewed medications and allergies.     Objective:   Physical Exam BP 100/60 mmHg  Pulse 80  Ht 5' 8.75" (1.746 m)  Wt 134 lb 8 oz (61.009 kg)  BMI 20.01 kg/m2  LMP 05/17/2014   UPT negative, Skin warm and dry.Pelvic: external genitalia is normal in appearance no lesions, vagina: period like blood,urethra has no lesions or masses noted, cervix:smooth and bulbous, no CMT, uterus: normal size, shape and contour, mildly tender, no masses felt, adnexa: no masses or tenderness noted. Bladder is non tender and no masses felt. Has tenderness above navel, and slightly below it. GC/CHL obtained.  Assessment:     AUB Periumbilical pain Decreased appetite     Plan:    Check CBC,CMP,TSH and H pylori  GC/CHL sent Rx megace 40 mg #45 3 x 5 days then 2 x 5 days then 1 daily with 1 refill   Rx Prilosec 20 mg #30 take 1 daily with 6 refills Return in 2 weeks  Review handout on AUB and abdominal pain

## 2014-05-28 NOTE — Patient Instructions (Signed)
Abdominal Pain Many things can cause abdominal pain. Usually, abdominal pain is not caused by a disease and will improve without treatment. It can often be observed and treated at home. Your health care provider will do a physical exam and possibly order blood tests and X-rays to help determine the seriousness of your pain. However, in many cases, more time must pass before a clear cause of the pain can be found. Before that point, your health care provider may not know if you need more testing or further treatment. HOME CARE INSTRUCTIONS  Monitor your abdominal pain for any changes. The following actions may help to alleviate any discomfort you are experiencing:  Only take over-the-counter or prescription medicines as directed by your health care provider.  Do not take laxatives unless directed to do so by your health care provider.  Try a clear liquid diet (broth, tea, or water) as directed by your health care provider. Slowly move to a bland diet as tolerated. SEEK MEDICAL CARE IF:  You have unexplained abdominal pain.  You have abdominal pain associated with nausea or diarrhea.  You have pain when you urinate or have a bowel movement.  You experience abdominal pain that wakes you in the night.  You have abdominal pain that is worsened or improved by eating food.  You have abdominal pain that is worsened with eating fatty foods.  You have a fever. SEEK IMMEDIATE MEDICAL CARE IF:   Your pain does not go away within 2 hours.  You keep throwing up (vomiting).  Your pain is felt only in portions of the abdomen, such as the right side or the left lower portion of the abdomen.  You pass bloody or black tarry stools. MAKE SURE YOU:  Understand these instructions.   Will watch your condition.   Will get help right away if you are not doing well or get worse.  Document Released: 09/27/2004 Document Revised: 12/23/2012 Document Reviewed: 08/27/2012 [facility] Patient Information  2015 [place], [place]. This information is not intended to replace advice given to you by your health care provider. Make sure you discuss any questions you have with your health care provider. Dysfunctional Uterine Bleeding Normally, menstrual periods begin between ages 57 to 66 in young women. A normal menstrual cycle/period may begin every 23 days up to 35 days and lasts from 1 to 7 days. Around 12 to 14 days before your menstrual period starts, ovulation (ovary produces an egg) occurs. When counting the time between menstrual periods, count from the first day of bleeding of the previous period to the first day of bleeding of the next period. Dysfunctional (abnormal) uterine bleeding is bleeding that is different from a normal menstrual period. Your periods may come earlier or later than usual. They may be lighter, have blood clots or be heavier. You may have bleeding between periods, or you may skip one period or more. You may have bleeding after sexual intercourse, bleeding after menopause, or no menstrual period. CAUSES   Pregnancy (normal, miscarriage, tubal).  IUDs (intrauterine device, birth control).  Birth control pills.  Hormone treatment.  Menopause.  Infection of the cervix.  Blood clotting problems.  Infection of the inside lining of the uterus.  Endometriosis, inside lining of the uterus growing in the pelvis and other female organs.  Adhesions (scar tissue) inside the uterus.  Obesity or severe weight loss.  Uterine polyps inside the uterus.  Cancer of the vagina, cervix, or uterus.  Ovarian cysts or polycystic ovary syndrome.  Medical problems (diabetes, thyroid disease).  Uterine fibroids (noncancerous tumor).  Problems with your female hormones.  Endometrial hyperplasia, very thick lining and enlarged cells inside of the uterus.  Medicines that interfere with ovulation.  Radiation to the pelvis or abdomen.  Chemotherapy. DIAGNOSIS   Your doctor will  discuss the history of your menstrual periods, medicines you are taking, changes in your weight, stress in your life, and any medical problems you may have.  Your doctor will do a physical and pelvic examination.  Your doctor may want to perform certain tests to make a diagnosis, such as:  Pap test.  Blood tests.  Cultures for infection.  CT scan.  Ultrasound.  Hysteroscopy.  Laparoscopy.  MRI.  Hysterosalpingography.  D and C.  Endometrial biopsy. TREATMENT  Treatment will depend on the cause of the dysfunctional uterine bleeding (DUB). Treatment may include:  Observing your menstrual periods for a couple of months.  Prescribing medicines for medical problems, including:  Antibiotics.  Hormones.  Birth control pills.  Removing an IUD (intrauterine device, birth control).  Surgery:  D and C (scrape and remove tissue from inside the uterus).  Laparoscopy (examine inside the abdomen with a lighted tube).  Uterine ablation (destroy lining of the uterus with electrical current, laser, heat, or freezing).  Hysteroscopy (examine cervix and uterus with a lighted tube).  Hysterectomy (remove the uterus). HOME CARE INSTRUCTIONS   If medicines were prescribed, take exactly as directed. Do not change or switch medicines without consulting your caregiver.  Long term heavy bleeding may result in iron deficiency. Your caregiver may have prescribed iron pills. They help replace the iron that your body lost from heavy bleeding. Take exactly as directed.  Do not take aspirin or medicines that contain aspirin one week before or during your menstrual period. Aspirin may make the bleeding worse.  If you need to change your sanitary pad or tampon more than once every 2 hours, stay in bed with your feet elevated and a cold pack on your lower abdomen. Rest as much as possible, until the bleeding stops or slows down.  Eat well-balanced meals. Eat foods high in iron. Examples  are:  Leafy green vegetables.  Whole-grain breads and cereals.  Eggs.  Meat.  Liver.  Do not try to lose weight until the abnormal bleeding has stopped and your blood iron level is back to normal. Do not lift more than ten pounds or do strenuous activities when you are bleeding.  For a couple of months, make note on your calendar, marking the start and ending of your period, and the type of bleeding (light, medium, heavy, spotting, clots or missed periods). This is for your caregiver to better evaluate your problem. SEEK MEDICAL CARE IF:   You develop nausea (feeling sick to your stomach) and vomiting, dizziness, or diarrhea while you are taking your medicine.  You are getting lightheaded or weak.  You have any problems that may be related to the medicine you are taking.  You develop pain with your DUB.  You want to remove your IUD.  You want to stop or change your birth control pills or hormones.  You have any type of abnormal bleeding mentioned above.  You are over 64 years old and have not had a menstrual period yet.  You are 24 years old and you are still having menstrual periods.  You have any of the symptoms mentioned above.  You develop a rash. SEEK IMMEDIATE MEDICAL CARE IF:   An  oral temperature above 102 F (38.9 C) develops.  You develop chills.  You are changing your sanitary pad or tampon more than once an hour.  You develop abdominal pain.  You pass out or faint. Document Released: 12/16/1999 Document Revised: 03/12/2011 Document Reviewed: 11/16/2008 [facility] Patient Information 2015 [place], [place]. This information is not intended to replace advice given to you by your health care provider. Make sure you discuss any questions you have with your health care provider. Eat small frequent meals Take megace Take prilosec Return in 2 weeks

## 2014-05-29 LAB — COMPREHENSIVE METABOLIC PANEL
A/G RATIO: 2.3 (ref 1.1–2.5)
ALT: 17 IU/L (ref 0–32)
AST: 26 IU/L (ref 0–40)
Albumin: 4.5 g/dL (ref 3.5–5.5)
Alkaline Phosphatase: 49 IU/L (ref 39–117)
BUN/Creatinine Ratio: 12 (ref 8–20)
BUN: 8 mg/dL (ref 6–20)
Bilirubin Total: 0.4 mg/dL (ref 0.0–1.2)
CO2: 24 mmol/L (ref 18–29)
CREATININE: 0.69 mg/dL (ref 0.57–1.00)
Calcium: 9.2 mg/dL (ref 8.7–10.2)
Chloride: 103 mmol/L (ref 97–108)
GFR calc Af Amer: 144 mL/min/{1.73_m2} (ref >59)
GFR, EST NON AFRICAN AMERICAN: 125 mL/min/{1.73_m2} (ref >59)
GLUCOSE: 83 mg/dL (ref 65–99)
Globulin, Total: 2 g/dL (ref 1.5–4.5)
POTASSIUM: 4.5 mmol/L (ref 3.5–5.2)
Sodium: 140 mmol/L (ref 134–144)
Total Protein: 6.5 g/dL (ref 6.0–8.5)

## 2014-05-29 LAB — CBC
HEMATOCRIT: 40.5 % (ref 34.0–46.6)
HEMOGLOBIN: 12.8 g/dL (ref 11.1–15.9)
MCH: 27.5 pg (ref 26.6–33.0)
MCHC: 31.6 g/dL (ref 31.5–35.7)
MCV: 87 fL (ref 79–97)
PLATELETS: 214 10*3/uL (ref 150–379)
RBC: 4.66 x10E6/uL (ref 3.77–5.28)
RDW: 14.2 % (ref 12.3–15.4)
WBC: 6.2 10*3/uL (ref 3.4–10.8)

## 2014-05-29 LAB — TSH: TSH: 0.838 u[IU]/mL (ref 0.450–4.500)

## 2014-05-29 LAB — H. PYLORI ANTIBODY, IGG

## 2014-06-01 ENCOUNTER — Telehealth: Payer: Self-pay | Admitting: Adult Health

## 2014-06-01 LAB — GC/CHLAMYDIA PROBE AMP
CHLAMYDIA, DNA PROBE: NEGATIVE
NEISSERIA GONORRHOEAE BY PCR: NEGATIVE

## 2014-06-01 NOTE — Telephone Encounter (Signed)
Pt aware of labs and says she feels better

## 2014-06-01 NOTE — Assessment & Plan Note (Signed)
Patient evaluated for left lower quadrant abdominal pains in addition to periumbilical to epigastric area pain. She was seen in the ED couple of occasions for similar symptoms. At one point was treated for gonorrhea appropriately. CT abdomen and pelvis last performed in the ED found no inflammatory changes in the right lower quadrant to suggest appendicitis. Also found dilated left ovarian veins with prominent periuterine and left adnexal vascularity suggestive of pelvic congestion syndrome. Also with a left adnexal cyst approximately 2.1 cm. Her abdominal pain symptom presentation appears consistent with pelvic congestion syndrome including pain that is worse with lying down and worse with menstruation. Given her presentation and pretty extensive GI evaluation in the ER will recommend she follow up with OB/GYN for further evaluation of an adnexal cyst and possible pelvic congestion syndrome. Return for evaluation in 3 months to our office to further evaluate her nausea as well as dyspepsia symptoms and we can check on status of her abdominal pain at that point including OB/GYN's opinion.

## 2014-06-01 NOTE — Assessment & Plan Note (Signed)
Patient presents with abdominal pain. Also admits dyspepsia type symptoms with nocturnal choking, coughing, and a bitter taste in the morning. This could be GERD related symptoms. I will start her on omeprazole 20 mg every evening, 30 minutes before her last meal the day. Return for further evaluation in 3 months.

## 2014-06-01 NOTE — Assessment & Plan Note (Signed)
Patient also complaining of nausea in addition to her abdominal pain and dyspepsia type symptoms. Denies vomiting. We'll provide for Zofran 4 mg 4 times a day as needed for nausea for symptomatically management until further evaluation of her abdominal pain can be completed. Return for follow-up in 3 months.

## 2014-06-09 NOTE — Progress Notes (Signed)
CC'ED TO PCP 

## 2014-06-10 ENCOUNTER — Encounter: Payer: Self-pay | Admitting: Adult Health

## 2014-06-10 ENCOUNTER — Ambulatory Visit (INDEPENDENT_AMBULATORY_CARE_PROVIDER_SITE_OTHER): Payer: Medicaid Other | Admitting: Adult Health

## 2014-06-10 VITALS — BP 100/70 | HR 84 | Ht 68.0 in | Wt 134.4 lb

## 2014-06-10 DIAGNOSIS — R1033 Periumbilical pain: Secondary | ICD-10-CM | POA: Diagnosis not present

## 2014-06-10 DIAGNOSIS — K429 Umbilical hernia without obstruction or gangrene: Secondary | ICD-10-CM

## 2014-06-10 DIAGNOSIS — S39011A Strain of muscle, fascia and tendon of abdomen, initial encounter: Secondary | ICD-10-CM

## 2014-06-10 HISTORY — DX: Strain of muscle, fascia and tendon of abdomen, initial encounter: S39.011A

## 2014-06-10 HISTORY — DX: Umbilical hernia without obstruction or gangrene: K42.9

## 2014-06-10 NOTE — Patient Instructions (Signed)
Get abdominal  Binder and wear except in shower Take prilosec And follow up in 2 weeks Take megace if bleeding If increased pain at navel go to ER Hernia A hernia occurs when an internal organ pushes out through a weak spot in the abdominal wall. Hernias most commonly occur in the groin and around the navel. Hernias often can be pushed back into place (reduced). Most hernias tend to get worse over time. Some abdominal hernias can get stuck in the opening (irreducible or incarcerated hernia) and cannot be reduced. An irreducible abdominal hernia which is tightly squeezed into the opening is at risk for impaired blood supply (strangulated hernia). A strangulated hernia is a medical emergency. Because of the risk for an irreducible or strangulated hernia, surgery may be recommended to repair a hernia. CAUSES   Heavy lifting.  Prolonged coughing.  Straining to have a bowel movement.  A cut (incision) made during an abdominal surgery. HOME CARE INSTRUCTIONS   Bed rest is not required. You may continue your normal activities.  Avoid lifting more than 10 pounds (4.5 kg) or straining.  Cough gently. If you are a smoker it is best to stop. Even the best hernia repair can break down with the continual strain of coughing. Even if you do not have your hernia repaired, a cough will continue to aggravate the problem.  Do not wear anything tight over your hernia. Do not try to keep it in with an outside bandage or truss. These can damage abdominal contents if they are trapped within the hernia sac.  Eat a normal diet.  Avoid constipation. Straining over long periods of time will increase hernia size and encourage breakdown of repairs. If you cannot do this with diet alone, stool softeners may be used. SEEK IMMEDIATE MEDICAL CARE IF:   You have a fever.  You develop increasing abdominal pain.  You feel nauseous or vomit.  Your hernia is stuck outside the abdomen, looks discolored, feels hard, or  is tender.  You have any changes in your bowel habits or in the hernia that are unusual for you.  You have increased pain or swelling around the hernia.  You cannot push the hernia back in place by applying gentle pressure while lying down. MAKE SURE YOU:   Understand these instructions.  Will watch your condition.  Will get help right away if you are not doing well or get worse. Document Released: 12/18/2004 Document Revised: 03/12/2011 Document Reviewed: 08/07/2007 [facility] Patient Information 2015 [place], [place]. This information is not intended to replace advice given to you by your health care provider. Make sure you discuss any questions you have with your health care provider.

## 2014-06-10 NOTE — Progress Notes (Signed)
Subjective:     Patient ID: Lori Johnston, female   DOB: 1990/05/16, 24 y.o.   MRN: [id number]  HPI Lori Johnston is a 24 year old black female back in follow up of 5/12 visit when given megace to stop bleeding with nexplanon and it worked and was given prilosec and appetite did get better but abdomen around navel area still hurts.  Review of Systems  Patient denies any headaches, hearing loss, fatigue, blurred vision, shortness of breath, chest pain,  problems with bowel movements, urination, or intercourse. No joint pain or mood swings. See HPI for positives.  Reviewed past medical,surgical, social and family history. Reviewed medications and allergies.     Objective:   Physical Exam BP 100/70 mmHg  Pulse 84  Ht 5\' 8"  (1.727 m)  Wt 134 lb 6.4 oz (60.963 kg)  BMI 20.44 kg/m2  LMP 05/16/2016Skin warm and dry, has small reducible umbilical hernia and has rectus muscle separation and Dr Elonda Husky to co examine, has pain above and at navel with palpation.   Dr Elonda Husky suggests trying abdominal binder for 2 weeks, if still has pain may refer to surgeon to evaluate.  Assessment:     Periumbilical pain Umbilical hernia Rectus muscle separation and strain    Plan:     Take Prilosec daily Can take megace if bleeding returns Get abdominal binder and wear except when in shower Follow up in 2 weeks If has increased pain or navel hard go to ER Review handout on hernia

## 2014-06-24 ENCOUNTER — Ambulatory Visit: Payer: Medicaid Other | Admitting: Adult Health

## 2014-06-30 ENCOUNTER — Ambulatory Visit: Payer: Medicaid Other | Admitting: Adult Health

## 2014-07-06 ENCOUNTER — Encounter: Payer: Self-pay | Admitting: Adult Health

## 2014-07-06 ENCOUNTER — Ambulatory Visit (INDEPENDENT_AMBULATORY_CARE_PROVIDER_SITE_OTHER): Payer: Medicaid Other | Admitting: Adult Health

## 2014-07-06 VITALS — BP 120/60 | HR 80 | Ht 69.25 in | Wt 131.0 lb

## 2014-07-06 DIAGNOSIS — Z3202 Encounter for pregnancy test, result negative: Secondary | ICD-10-CM

## 2014-07-06 DIAGNOSIS — R634 Abnormal weight loss: Secondary | ICD-10-CM | POA: Insufficient documentation

## 2014-07-06 DIAGNOSIS — K429 Umbilical hernia without obstruction or gangrene: Secondary | ICD-10-CM | POA: Diagnosis not present

## 2014-07-06 DIAGNOSIS — Z975 Presence of (intrauterine) contraceptive device: Secondary | ICD-10-CM | POA: Diagnosis not present

## 2014-07-06 HISTORY — DX: Presence of (intrauterine) contraceptive device: Z97.5

## 2014-07-06 HISTORY — DX: Abnormal weight loss: R63.4

## 2014-07-06 LAB — POCT URINE PREGNANCY: Preg Test, Ur: NEGATIVE

## 2014-07-06 NOTE — Progress Notes (Signed)
Subjective:     Patient ID: Lori Johnston, female   DOB: 03-Dec-1990, 24 y.o.   MRN: [id number]  HPI Lori Johnston is a 24 year old black female in complaining of weight loss with nexplanon, had normal labs with B. Collene Mares, PA at [facility].Wants hernia fixed, her mom just had one repaired.  Review of Systems Patient denies any headaches, hearing loss, fatigue, blurred vision, shortness of breath, chest pain, abdominal pain, problems with bowel movements, urination, or intercourse. No joint pain or mood swings.See HPI for positives.Denies depression but has stress.  Reviewed past medical,surgical, social and family history. Reviewed medications and allergies.     Objective:   Physical Exam BP 120/60 mmHg  Pulse 80  Ht 5' 9.25" (1.759 m)  Wt 131 lb (59.421 kg)  BMI 19.20 kg/m2 UPT negative. Skin warm and dry. Neck: mid line trachea, normal thyroid, good ROM, no lymphadenopathy noted. Lungs: clear to ausculation bilaterally. Cardiovascular: regular rate and rhythm.Has small umbilical hernia and rectus muscle separation, she has lost about 13-14 lbs since getting nexplanon.She wants hernia fixed.Discussed trying to eat more often and will start OCs now.    Assessment:     Weight loss Nexplanon in place Umbilical hernia     Plan:     Start minastrin today, 1 pack given lot [id number] A exp6/17 Eat 6 small meals a day Increase fluids Follow up in 4 weeks Refer to Dr Arnoldo Morale to assess hernia

## 2014-07-06 NOTE — Patient Instructions (Signed)
Start minastrin today Eat 6 small meals Increase fluids

## 2014-08-03 ENCOUNTER — Encounter: Payer: Medicaid Other | Admitting: Advanced Practice Midwife

## 2014-08-04 ENCOUNTER — Ambulatory Visit (INDEPENDENT_AMBULATORY_CARE_PROVIDER_SITE_OTHER): Payer: Medicaid Other | Admitting: Adult Health

## 2014-08-04 ENCOUNTER — Encounter: Payer: Self-pay | Admitting: Adult Health

## 2014-08-04 VITALS — BP 98/70 | HR 72 | Ht 68.0 in | Wt 134.0 lb

## 2014-08-04 DIAGNOSIS — Z975 Presence of (intrauterine) contraceptive device: Secondary | ICD-10-CM | POA: Diagnosis not present

## 2014-08-04 DIAGNOSIS — Z308 Encounter for other contraceptive management: Secondary | ICD-10-CM | POA: Diagnosis not present

## 2014-08-04 DIAGNOSIS — K429 Umbilical hernia without obstruction or gangrene: Secondary | ICD-10-CM

## 2014-08-04 DIAGNOSIS — N938 Other specified abnormal uterine and vaginal bleeding: Secondary | ICD-10-CM

## 2014-08-04 DIAGNOSIS — R634 Abnormal weight loss: Secondary | ICD-10-CM

## 2014-08-04 MED ORDER — NORETHIN ACE-ETH ESTRAD-FE 1-20 MG-MCG(24) PO CHEW: 1.0000 | CHEWABLE_TABLET | Freq: Every day | ORAL | Status: DC

## 2014-08-04 NOTE — Progress Notes (Signed)
Subjective:     Patient ID: Lori Johnston, female   DOB: 28-Dec-1990, 24 y.o.   MRN: [id number]  HPI Lori Johnston is a 24 year old black female back in follow up of trying minastrin to see if helps with bleeding and makes  her any better.She says she feels better and has gained weight and is having no bleeding now and see Dr Arnoldo Morale next week,she did say at [place] said she weighed 143.  Review of Systems Patient denies any headaches, hearing loss, fatigue, blurred vision, shortness of breath, chest pain, abdominal pain, problems with bowel movements, urination, or intercourse. No joint pain or mood swings. Reviewed past medical,surgical, social and family history. Reviewed medications and allergies.     Objective:   Physical Exam BP 98/70 mmHg  Pulse 72  Ht 5\' 8"  (1.727 m)  Wt 134 lb (60.782 kg)  BMI 20.38 kg/m2Skin warm and dry, seems happy,   has gained 3 lbs, umbilical hernia soft and reducible.she wants to keep taking the OCs and leave nexplanon in her arm for now, she says PCP says to go back on depo, she will wait for now.She is happy she has gained some weight.   Assessment:     Weight loss Nexplanon in place Umbilical hernia    Plan:     Rx minastrin disp 1 pack take 1 daily with 11 refill Sees Dr Arnoldo Morale 8/9 Follow up in 5 weeks Keep eating small frequent meals

## 2014-08-04 NOTE — Patient Instructions (Signed)
Start back on minastrin and follow up in September

## 2014-08-13 ENCOUNTER — Encounter (HOSPITAL_COMMUNITY): Payer: Self-pay

## 2014-08-13 ENCOUNTER — Encounter (HOSPITAL_COMMUNITY)
Admission: RE | Admit: 2014-08-13 | Discharge: 2014-08-13 | Disposition: A | Discharge status: Home / Self Care | Payer: Medicaid Other | Source: Ambulatory Visit | Attending: General Surgery | Admitting: General Surgery

## 2014-08-13 DIAGNOSIS — K429 Umbilical hernia without obstruction or gangrene: Secondary | ICD-10-CM | POA: Insufficient documentation

## 2014-08-13 DIAGNOSIS — Z01812 Encounter for preprocedural laboratory examination: Secondary | ICD-10-CM | POA: Diagnosis not present

## 2014-08-13 LAB — CBC WITH DIFFERENTIAL/PLATELET
BASOS ABS: 0 10*3/uL (ref 0.0–0.1)
Basophils Relative: 0 % (ref 0–1)
Eosinophils Absolute: 0.1 10*3/uL (ref 0.0–0.7)
Eosinophils Relative: 2 % (ref 0–5)
HEMATOCRIT: 37.8 % (ref 36.0–46.0)
Hemoglobin: 12.3 g/dL (ref 12.0–15.0)
LYMPHS ABS: 1.9 10*3/uL (ref 0.7–4.0)
LYMPHS PCT: 23 % (ref 12–46)
MCH: 28.3 pg (ref 26.0–34.0)
MCHC: 32.5 g/dL (ref 30.0–36.0)
MCV: 87.1 fL (ref 78.0–100.0)
MONO ABS: 0.6 10*3/uL (ref 0.1–1.0)
Monocytes Relative: 7 % (ref 3–12)
Neutro Abs: 5.5 10*3/uL (ref 1.7–7.7)
Neutrophils Relative %: 68 % (ref 43–77)
Platelets: 229 10*3/uL (ref 150–400)
RBC: 4.34 MIL/uL (ref 3.87–5.11)
RDW: 15.1 % (ref 11.5–15.5)
WBC: 8.2 10*3/uL (ref 4.0–10.5)

## 2014-08-13 LAB — BASIC METABOLIC PANEL
Anion gap: 6 (ref 5–15)
BUN: 13 mg/dL (ref 6–20)
CALCIUM: 8.9 mg/dL (ref 8.9–10.3)
CO2: 27 mmol/L (ref 22–32)
Chloride: 104 mmol/L (ref 101–111)
Creatinine, Ser: 0.68 mg/dL (ref 0.44–1.00)
GFR calc non Af Amer: >60 mL/min (ref >60)
Glucose, Bld: 90 mg/dL (ref 65–99)
Potassium: 4.4 mmol/L (ref 3.5–5.1)
Sodium: 137 mmol/L (ref 135–145)

## 2014-08-13 LAB — HCG, SERUM, QUALITATIVE: Preg, Serum: NEGATIVE

## 2014-08-13 NOTE — Patient Instructions (Signed)
Lori Johnston  08/13/2014     @PREFPERIOPPHARMACY @   Your procedure is scheduled on 08/18/2014.  Report to [facility] at 7:15 A.M.  Call this number if you have problems the morning of surgery:  [phone]   Remember:  Do not eat food or drink liquids after midnight.  Take these medicines the morning of surgery with A SIP OF WATER May take Celebrex, if needed   Do not wear jewelry, make-up or nail polish.  Do not wear lotions, powders, or perfumes.  You may wear deodorant.  Do not shave 48 hours prior to surgery.  Men may shave face and neck.  Do not bring valuables to the hospital.  [facility] is not responsible for any belongings or valuables.  Contacts, dentures or bridgework may not be worn into surgery.  Leave your suitcase in the car.  After surgery it may be brought to your room.  For patients admitted to the hospital, discharge time will be determined by your treatment team.  Patients discharged the day of surgery will not be allowed to drive home.    Please read over the following fact sheets that you were given. Surgical Site Infection Prevention and Anesthesia Post-op Instructions      PATIENT INSTRUCTIONS POST-ANESTHESIA  IMMEDIATELY FOLLOWING SURGERY:  Do not drive or operate machinery for the first twenty four hours after surgery.  Do not make any important decisions for twenty four hours after surgery or while taking narcotic pain medications or sedatives.  If you develop intractable nausea and vomiting or a severe headache please notify your doctor immediately.  FOLLOW-UP:  Please make an appointment with your surgeon as instructed. You do not need to follow up with anesthesia unless specifically instructed to do so.  WOUND CARE INSTRUCTIONS (if applicable):  Keep a dry clean dressing on the anesthesia/puncture wound site if there is drainage.  Once the wound has quit draining you may leave it open to air.  Generally you should leave the bandage intact for  twenty four hours unless there is drainage.  If the epidural site drains for more than 36-48 hours please call the anesthesia department.  QUESTIONS?:  Please feel free to call your physician or the hospital operator if you have any questions, and they will be happy to assist you.      Hernia Repair with Laparoscope A hernia occurs when an internal organ pushes out through a weak spot in the belly (abdominal) wall muscles. Hernias most commonly occur in the groin and around the navel. Hernias can also occur through a cut by the surgeon (incision) after an abdominal operation. A hernia may be caused by:  Lifting heavy objects.  Prolonged coughing.  Straining to move your bowels. Hernias can often be pushed back into place (reduced). Most hernias tend to get worse over time. Problems occur when abdominal contents get stuck in the opening and the blood supply is blocked or impaired (incarcerated hernia). Because of these risks, you require surgery to repair the hernia. Your hernia will be repaired using a laparoscope. Laparoscopic surgery is a type of minimally invasive surgery. It does not involve making a typical surgical cut (incision) in the skin. A laparoscope is a telescope-like rod and lens system. It is usually connected to a video camera and a light source so your caregiver can clearly see the operative area. The instruments are inserted through  to  inch (5 mm or 10 mm) openings in the skin at specific locations.  A working and viewing space is created by blowing a small amount of carbon dioxide gas into the abdominal cavity. The abdomen is essentially blown up like a balloon (insufflated). This elevates the abdominal wall above the internal organs like a dome. The carbon dioxide gas is common to the human body and can be absorbed by tissue and removed by the respiratory system. Once the repair is completed, the small incisions will be closed with either stitches (sutures) or staples (just like  a paper stapler only this staple holds the skin together). LET YOUR CAREGIVERS KNOW ABOUT:  Allergies.  Medications taken including herbs, eye drops, over the counter medications, and creams.  Use of steroids (by mouth or creams).  Previous problems with anesthetics or Novocaine.  Possibility of pregnancy, if this applies.  History of blood clots (thrombophlebitis).  History of bleeding or blood problems.  Previous surgery.  Other health problems. BEFORE THE PROCEDURE  Laparoscopy can be done either in a hospital or out-patient clinic. You may be given a mild sedative to help you relax before the procedure. Once in the operating room, you will be given a general anesthesia to make you sleep (unless you and your caregiver choose a different anesthetic).  AFTER THE PROCEDURE  After the procedure you will be watched in a recovery area. Depending on what type of hernia was repaired, you might be admitted to the hospital or you might go home the same day. With this procedure you may have less pain and scarring. This usually results in a quicker recovery and less risk of infection. HOME CARE INSTRUCTIONS   Bed rest is not required. You may continue your normal activities but avoid heavy lifting (more than 10 pounds) or straining.  Cough gently. If you are a smoker it is best to stop, as even the best hernia repair can break down with the continual strain of coughing.  Avoid driving until given the OK by your surgeon.  There are no dietary restrictions unless given otherwise.  TAKE ALL MEDICATIONS AS DIRECTED.  Only take over-the-counter or prescription medicines for pain, discomfort, or fever as directed by your caregiver. SEEK MEDICAL CARE IF:   There is increasing abdominal pain or pain in your incisions.  There is more bleeding from incisions, other than minimal spotting.  You feel light headed or faint.  You develop an unexplained fever, chills, and/or an oral temperature  above 102 F (38.9 C).  You have redness, swelling, or increasing pain in the wound.  Pus coming from wound.  A foul smell coming from the wound or dressings. SEEK IMMEDIATE MEDICAL CARE IF:   You develop a rash.  You have difficulty breathing.  You have any allergic problems. MAKE SURE YOU:   Understand these instructions.  Will watch your condition.  Will get help right away if you are not doing well or get worse. Document Released: 12/18/2004 Document Revised: 03/12/2011 Document Reviewed: 11/17/2008 [facility] Patient Information 2015 [place], [place]. This information is not intended to replace advice given to you by your health care provider. Make sure you discuss any questions you have with your health care provider.

## 2014-08-13 NOTE — H&P (Signed)
  NTS SOAP Note  Vital Signs:  Vitals as of: 6/43/3295: Systolic 188: Diastolic 54: Heart Rate 87: Temp 98.59F: Height 89ft 8in: Weight 137Lbs 0 Ounces: Pain Level 9: BMI 20.83  BMI : 20.83 kg/m2  Subjective: This 24 year old female presents for of an umbilical hernia.  Has been present for some time.  Review of Symptoms:  Constitutional:unremarkable   headache Eyes:unremarkable   Nose/Mouth/Throat:unremarkable Cardiovascular:  unremarkable Respiratory:unremarkable Gastrointestinal:  unremarkable   Genitourinary:unremarkable   back pain Skin:unremarkable Hematolgic/Lymphatic:unremarkable   Allergic/Immunologic:unremarkable   Past Medical History:  Reviewed  Past Medical History  Allergies: nkda Medications: nexplanon, minastrin   Social History:Reviewed  Social History  Preferred Language: English Race:  Black or African American Ethnicity: Not Hispanic / Latino Age: 33 year Marital Status:  S Alcohol: rarely   Smoking Status: Never smoker reviewed on 08/12/2014 Functional Status reviewed on 08/12/2014 ------------------------------------------------ Bathing: Normal Cooking: Normal Dressing: Normal Driving: Normal Eating: Normal Managing Meds: Normal Oral Care: Normal Shopping: Normal Toileting: Normal Transferring: Normal Walking: Normal Cognitive Status reviewed on 08/12/2014 ------------------------------------------------ Attention: Normal Decision Making: Normal Language: Normal Memory: Normal Motor: Normal Perception: Normal Problem Solving: Normal Visual and Spatial: Normal   Family History:Reviewed  Family Health History Family History is Unknown    Objective Information: General:Well appearing, well nourished in no distress. Heart:RRR, no murmur Lungs:  CTA bilaterally, no wheezes, rhonchi, rales.  Breathing unlabored. Abdomen:Soft, NT/ND, no HSM, no masses.  Umbilical hernia  present.  Assessment:Umbilical hernia  Diagnoses: 416.6  A63.0 Umbilical hernia (Umbilical hernia without obstruction or gangrene)  Procedures: 16010 - OFFICE OUTPATIENT NEW 30 MINUTES    Plan:  Scheduled for umbilical herniorrhaphy with mesh on 08/18/14.   Patient Education:Alternative treatments to surgery were discussed with patient (and family).  Risks and benefits  of procedure including bleeding, infection, mesh use, and the possiblity of recurrence were fully explained to the patient (and family) who gave informed consent. Patient/family questions were addressed.  Follow-up:Pending Surgery

## 2014-08-18 ENCOUNTER — Encounter (HOSPITAL_COMMUNITY): Payer: Self-pay | Admitting: *Deleted

## 2014-08-18 ENCOUNTER — Encounter (HOSPITAL_COMMUNITY)
Admission: RE | Disposition: A | Discharge status: Home / Self Care | Payer: Self-pay | Source: Ambulatory Visit | Attending: General Surgery

## 2014-08-18 ENCOUNTER — Ambulatory Visit (HOSPITAL_COMMUNITY): Payer: Medicaid Other | Admitting: Anesthesiology

## 2014-08-18 ENCOUNTER — Ambulatory Visit (HOSPITAL_COMMUNITY)
Admission: RE | Admit: 2014-08-18 | Discharge: 2014-08-18 | Disposition: A | Discharge status: Home / Self Care | Payer: Medicaid Other | Source: Ambulatory Visit | Attending: General Surgery | Admitting: General Surgery

## 2014-08-18 DIAGNOSIS — Z87891 Personal history of nicotine dependence: Secondary | ICD-10-CM | POA: Insufficient documentation

## 2014-08-18 DIAGNOSIS — K429 Umbilical hernia without obstruction or gangrene: Secondary | ICD-10-CM | POA: Insufficient documentation

## 2014-08-18 HISTORY — PX: INSERTION OF MESH: SHX5868

## 2014-08-18 HISTORY — PX: UMBILICAL HERNIA REPAIR: SHX196

## 2014-08-18 SURGERY — REPAIR, HERNIA, UMBILICAL, ADULT
Anesthesia: General | Site: Abdomen

## 2014-08-18 MED ORDER — ONDANSETRON HCL 4 MG/2ML IJ SOLN
4.0000 mg | Freq: Once | INTRAMUSCULAR | Status: AC
  Administered 2014-08-18: 4 mg via INTRAVENOUS

## 2014-08-18 MED ORDER — LIDOCAINE HCL (PF) 1 % IJ SOLN
INTRAMUSCULAR | Status: AC
  Filled 2014-08-18: qty 5

## 2014-08-18 MED ORDER — MIDAZOLAM HCL 2 MG/2ML IJ SOLN
INTRAMUSCULAR | Status: AC
  Filled 2014-08-18: qty 2

## 2014-08-18 MED ORDER — POVIDONE-IODINE 10 % OINT PACKET
TOPICAL_OINTMENT | CUTANEOUS | Status: DC | PRN
  Administered 2014-08-18: 1 via TOPICAL

## 2014-08-18 MED ORDER — BUPIVACAINE HCL (PF) 0.5 % IJ SOLN
INTRAMUSCULAR | Status: DC | PRN
  Administered 2014-08-18: 10 mL

## 2014-08-18 MED ORDER — FENTANYL CITRATE (PF) 100 MCG/2ML IJ SOLN
INTRAMUSCULAR | Status: AC
  Filled 2014-08-18: qty 4

## 2014-08-18 MED ORDER — ONDANSETRON HCL 4 MG/2ML IJ SOLN
INTRAMUSCULAR | Status: AC
  Filled 2014-08-18: qty 2

## 2014-08-18 MED ORDER — CEFAZOLIN SODIUM-DEXTROSE 2-3 GM-% IV SOLR
INTRAVENOUS | Status: AC
  Filled 2014-08-18: qty 50

## 2014-08-18 MED ORDER — MIDAZOLAM HCL 2 MG/2ML IJ SOLN
1.0000 mg | INTRAMUSCULAR | Status: DC | PRN
  Administered 2014-08-18 (×2): 2 mg via INTRAVENOUS

## 2014-08-18 MED ORDER — LIDOCAINE HCL 1 % IJ SOLN
INTRAMUSCULAR | Status: DC | PRN
  Administered 2014-08-18: 20 mg via INTRADERMAL

## 2014-08-18 MED ORDER — LACTATED RINGERS IV SOLN
INTRAVENOUS | Status: DC
  Administered 2014-08-18: 08:00:00 via INTRAVENOUS

## 2014-08-18 MED ORDER — DEXAMETHASONE SODIUM PHOSPHATE 4 MG/ML IJ SOLN
4.0000 mg | Freq: Once | INTRAMUSCULAR | Status: AC
  Administered 2014-08-18: 4 mg via INTRAVENOUS

## 2014-08-18 MED ORDER — SODIUM CHLORIDE 0.9 % IR SOLN
Status: DC | PRN
  Administered 2014-08-18: 1000 mL

## 2014-08-18 MED ORDER — FENTANYL CITRATE (PF) 100 MCG/2ML IJ SOLN
INTRAMUSCULAR | Status: DC | PRN
  Administered 2014-08-18: 50 ug via INTRAVENOUS

## 2014-08-18 MED ORDER — KETOROLAC TROMETHAMINE 30 MG/ML IJ SOLN
30.0000 mg | Freq: Once | INTRAMUSCULAR | Status: AC
  Administered 2014-08-18: 30 mg via INTRAVENOUS
  Filled 2014-08-18: qty 1

## 2014-08-18 MED ORDER — MIDAZOLAM HCL 5 MG/5ML IJ SOLN
INTRAMUSCULAR | Status: DC | PRN
  Administered 2014-08-18 (×2): 1 mg via INTRAVENOUS

## 2014-08-18 MED ORDER — FENTANYL CITRATE (PF) 100 MCG/2ML IJ SOLN
INTRAMUSCULAR | Status: AC
  Filled 2014-08-18: qty 2

## 2014-08-18 MED ORDER — BUPIVACAINE HCL (PF) 0.5 % IJ SOLN
INTRAMUSCULAR | Status: AC
  Filled 2014-08-18: qty 30

## 2014-08-18 MED ORDER — OXYCODONE-ACETAMINOPHEN 7.5-325 MG PO TABS: 1.0000 | ORAL_TABLET | ORAL | Status: DC | PRN

## 2014-08-18 MED ORDER — FENTANYL CITRATE (PF) 100 MCG/2ML IJ SOLN
25.0000 ug | INTRAMUSCULAR | Status: AC
  Administered 2014-08-18 (×2): 25 ug via INTRAVENOUS

## 2014-08-18 MED ORDER — POVIDONE-IODINE 10 % EX OINT
TOPICAL_OINTMENT | CUTANEOUS | Status: AC
  Filled 2014-08-18: qty 1

## 2014-08-18 MED ORDER — ONDANSETRON HCL 4 MG/2ML IJ SOLN: 4.0000 mg | Freq: Once | INTRAMUSCULAR | Status: DC | PRN

## 2014-08-18 MED ORDER — CEFAZOLIN SODIUM-DEXTROSE 2-3 GM-% IV SOLR
2.0000 g | INTRAVENOUS | Status: AC
  Administered 2014-08-18: 2 g via INTRAVENOUS

## 2014-08-18 MED ORDER — CHLORHEXIDINE GLUCONATE 4 % EX LIQD
1.0000 | Freq: Once | CUTANEOUS | Status: DC
Start: 2014-08-18 — End: 2014-08-18

## 2014-08-18 MED ORDER — PROPOFOL 10 MG/ML IV BOLUS
INTRAVENOUS | Status: DC | PRN
  Administered 2014-08-18: 100 mg via INTRAVENOUS

## 2014-08-18 MED ORDER — FENTANYL CITRATE (PF) 100 MCG/2ML IJ SOLN: 25.0000 ug | INTRAMUSCULAR | Status: DC | PRN

## 2014-08-18 MED ORDER — DEXAMETHASONE SODIUM PHOSPHATE 4 MG/ML IJ SOLN
INTRAMUSCULAR | Status: AC
  Filled 2014-08-18: qty 1

## 2014-08-18 SURGICAL SUPPLY — 34 items
BAG HAMPER (MISCELLANEOUS) ×3 IMPLANT
BLADE SURG SZ11 CARB STEEL (BLADE) ×3 IMPLANT
CHLORAPREP W/TINT 26ML (MISCELLANEOUS) ×3 IMPLANT
CLOTH BEACON ORANGE TIMEOUT ST (SAFETY) ×3 IMPLANT
COVER LIGHT HANDLE STERIS (MISCELLANEOUS) ×6 IMPLANT
DECANTER SPIKE VIAL GLASS SM (MISCELLANEOUS) ×3 IMPLANT
ELECT REM PT RETURN 9FT ADLT (ELECTROSURGICAL) ×3
ELECTRODE REM PT RTRN 9FT ADLT (ELECTROSURGICAL) ×1 IMPLANT
GLOVE BIOGEL PI IND STRL 7.0 (GLOVE) ×1 IMPLANT
GLOVE BIOGEL PI INDICATOR 7.0 (GLOVE) ×2
GLOVE ECLIPSE 6.5 STRL STRAW (GLOVE) ×3 IMPLANT
GLOVE EXAM NITRILE MD LF STRL (GLOVE) ×3 IMPLANT
GLOVE SURG SS PI 7.5 STRL IVOR (GLOVE) ×6 IMPLANT
GOWN STRL REUS W/ TWL LRG LVL3 (GOWN DISPOSABLE) ×1 IMPLANT
GOWN STRL REUS W/TWL LRG LVL3 (GOWN DISPOSABLE) ×8 IMPLANT
INST SET MINOR GENERAL (KITS) ×3 IMPLANT
KIT ROOM TURNOVER APOR (KITS) ×3 IMPLANT
MANIFOLD NEPTUNE II (INSTRUMENTS) ×3 IMPLANT
MESH VENTRALEX ST 1-7/10 CRC S (Mesh General) ×3 IMPLANT
NEEDLE HYPO 25X1 1.5 SAFETY (NEEDLE) ×3 IMPLANT
NS IRRIG 1000ML POUR BTL (IV SOLUTION) ×3 IMPLANT
PACK MINOR (CUSTOM PROCEDURE TRAY) ×3 IMPLANT
PAD ARMBOARD 7.5X6 YLW CONV (MISCELLANEOUS) ×3 IMPLANT
SET BASIN LINEN APH (SET/KITS/TRAYS/PACK) ×3 IMPLANT
SPONGE GAUZE 2X2 8PLY STER LF (GAUZE/BANDAGES/DRESSINGS) ×2
SPONGE GAUZE 2X2 8PLY STRL LF (GAUZE/BANDAGES/DRESSINGS) ×4 IMPLANT
STAPLER VISISTAT (STAPLE) ×3 IMPLANT
SUT ETHIBOND NAB MO 7 #0 18IN (SUTURE) ×3 IMPLANT
SUT VIC AB 2-0 CT2 27 (SUTURE) IMPLANT
SUT VIC AB 3-0 SH 27 (SUTURE) ×3
SUT VIC AB 3-0 SH 27X BRD (SUTURE) ×1 IMPLANT
SUT VICRYL AB 3 0 TIES (SUTURE) IMPLANT
SYR CONTROL 10ML LL (SYRINGE) ×3 IMPLANT
TAPE CLOTH SURG 4X10 WHT LF (GAUZE/BANDAGES/DRESSINGS) ×3 IMPLANT

## 2014-08-18 NOTE — Interval H&P Note (Signed)
History and Physical Interval Note:  08/18/2014 8:12 AM  Lori Johnston  has presented today for surgery, with the diagnosis of umbilical hernia  The various methods of treatment have been discussed with the patient and family. After consideration of risks, benefits and other options for treatment, the patient has consented to  Procedure(s): HERNIA REPAIR UMBILICAL ADULT WITH MESH (N/A) as a surgical intervention .  The patient's history has been reviewed, patient examined, no change in status, stable for surgery.  I have reviewed the patient's chart and labs.  Questions were answered to the patient's satisfaction.     [name] A

## 2014-08-18 NOTE — Anesthesia Postprocedure Evaluation (Signed)
  Anesthesia Post-op Note  Patient: Lori Johnston  Procedure(s) Performed: Procedure(s): UMBILICAL HERNIORRHAPHY (N/A) INSERTION OF MESH (N/A)  Patient Location: PACU  Anesthesia Type:General  Level of Consciousness: awake, alert , oriented and patient cooperative  Airway and Oxygen Therapy: Patient Spontanous Breathing  Post-op Pain: 2 /10, mild  Post-op Assessment: Post-op Vital signs reviewed, Patient's Cardiovascular Status Stable, Respiratory Function Stable, Patent Airway, No signs of Nausea or vomiting and Pain level controlled              Post-op Vital Signs: Reviewed and stable  Last Vitals:  Filed Vitals:   08/18/14 0915  BP: 103/57  Pulse:   Temp:   Resp: 11    Complications: No apparent anesthesia complications

## 2014-08-18 NOTE — Discharge Instructions (Signed)
Umbilical Herniorrhaphy, Care After °Refer to this sheet in the next few weeks. These instructions provide you with information on caring for yourself after your procedure. Your health care provider may also give you more specific instructions. Your treatment has been planned according to current medical practices, but problems sometimes occur. Call your health care provider if you have any problems or questions after your procedure. °HOME CARE INSTRUCTIONS °· If you are given antibiotic medicine, take it as directed. Finish it even if you start to feel better. °· Only take over-the-counter or prescription medicines for pain, fever, or discomfort as directed by your health care provider. Do not take aspirin. It can cause bleeding. °· Do not get your surgical cut (incision) area wet unless your health care provider says it is okay. °· Avoid lifting objects heavier than 10 lb (4.5 kg) for 8 weeks after surgery. °· Avoid sexual activity for 5 weeks after surgery or as directed by your health care provider. °· Do not drive while taking prescription pain medicine. °· You may return to your other normal, daily activities after 3 days or as directed by your health care provider. °SEEK MEDICAL CARE IF: °· You notice blood or fluid leaking from the surgical site. °· Your incision area becomes red or swollen. °· Your pain at the surgical site becomes worse or is not relieved by medicine. °· You have problems urinating. °· You feel nauseous or vomit more than 2 days after surgery. °· You notice the bulge in your abdomen returns after the procedure. °SEEK IMMEDIATE MEDICAL CARE IF: °· You have a fever. °· You have nausea or vomiting that will not stop. °Document Released: 06/19/2011 Document Revised: 05/04/2013 Document Reviewed: 06/19/2011 °[employer]® Patient Information ©2015 [employer]. This information is not intended to replace advice given to you by your health care provider. Make sure you discuss any questions you have  with your health care provider. ° °

## 2014-08-18 NOTE — Transfer of Care (Signed)
Immediate Anesthesia Transfer of Care Note  Patient: Lori Johnston  Procedure(s) Performed: Procedure(s): UMBILICAL HERNIORRHAPHY (N/A) INSERTION OF MESH (N/A)  Patient Location: PACU  Anesthesia Type:General  Level of Consciousness: sedated  Airway & Oxygen Therapy: Patient Spontanous Breathing and Patient connected to face mask oxygen  Post-op Assessment: Report given to RN and Post -op Vital signs reviewed and stable  Post vital signs: Reviewed and stable  Last Vitals:  Filed Vitals:   08/18/14 0915  BP: 103/57  Pulse:   Temp:   Resp: 11    Complications: No apparent anesthesia complications

## 2014-08-18 NOTE — Anesthesia Preprocedure Evaluation (Signed)
Anesthesia Evaluation  Patient identified by MRN, date of birth, ID band Patient awake    Reviewed: Allergy & Precautions, NPO status , Patient's Chart, lab work & pertinent test results  Airway Mallampati: I  TM Distance: >3 FB     Dental   Pulmonary former smoker,  breath sounds clear to auscultation        Cardiovascular negative cardio ROS  Rhythm:Regular Rate:Normal     Neuro/Psych  Headaches,    GI/Hepatic negative GI ROS,   Endo/Other    Renal/GU      Musculoskeletal   Abdominal   Peds  Hematology   Anesthesia Other Findings   Reproductive/Obstetrics                             Anesthesia Physical Anesthesia Plan  ASA: II  Anesthesia Plan: General   Post-op Pain Management:    Induction: Intravenous  Airway Management Planned: LMA  Additional Equipment:   Intra-op Plan:   Post-operative Plan: Extubation in OR  Informed Consent: I have reviewed the patients History and Physical, chart, labs and discussed the procedure including the risks, benefits and alternatives for the proposed anesthesia with the patient or authorized representative who has indicated his/her understanding and acceptance.     Plan Discussed with:   Anesthesia Plan Comments:         Anesthesia Quick Evaluation

## 2014-08-18 NOTE — Op Note (Signed)
Patient:  Lori Johnston  DOB:  1990/11/28  MRN:  [id number]   Preop Diagnosis:  Umbilical hernia  Postop Diagnosis:  Same   Procedure:  Umbilical herniorrhaphy with mesh  Surgeon:  [name], M.D.  Anes:  Gen. endotracheal  Indications:  Patient is a 24 year old black female who presents with a symptomatic umbilical hernia. The risks and benefits of the procedure including bleeding, infection, mesh use, and a possibly recurrence of the hernia were fully explained to the patient, who gave informed consent.  Procedure note:  Patient is placed the supine position. After induction of general endotracheal anesthesia, the abdomen was prepped and draped using usual sterile technique with DuraPrep. Surgical site confirmation was performed.  An infraumbilical incision was made down the fascia. The umbilicus was freed away from the underlying fascia. A 1.5 cm hernia defect was found. A 4.3 cm ventralax ST hernia patch was inserted and secured to the fascia using 0 Ethibond interrupted sutures. The overlying fascia was reapproximated transversely using 0 Ethibond interrupted sutures. The umbilicus was secured back to the fascia using a 2-0 Vicryl interrupted suture. The subcutaneous layer was reapproximated using 3-0 Vicryl interrupted suture. The skin was closed using staples. 0.5% Sensorcaine was instilled into the surrounding wound. Betadine ointment and a dry sterile dressing were applied.   All tape and needle counts were correct at the end of the procedure. Patient was extubated in the operating room and transferred to PACU in stable condition.  Complications:  None  EBL:  Minimal  Specimen:  None

## 2014-08-18 NOTE — Anesthesia Procedure Notes (Signed)
Procedure Name: LMA Insertion Date/Time: 08/18/2014 9:27 AM Performed by: Charmaine Downs Pre-anesthesia Checklist: Patient identified, Emergency Drugs available, Patient being monitored and Suction available Patient Re-evaluated:Patient Re-evaluated prior to inductionOxygen Delivery Method: Circle system utilized Preoxygenation: Pre-oxygenation with 100% oxygen Intubation Type: IV induction Ventilation: Mask ventilation without difficulty LMA: LMA inserted LMA Size: 3.0 Tube type: Oral Number of attempts: 1 Placement Confirmation: breath sounds checked- equal and bilateral and positive ETCO2 Tube secured with: Tape Dental Injury: Teeth and Oropharynx as per pre-operative assessment

## 2014-08-19 ENCOUNTER — Encounter (HOSPITAL_COMMUNITY): Payer: Self-pay | Admitting: General Surgery

## 2014-08-26 ENCOUNTER — Ambulatory Visit: Payer: Medicaid Other | Admitting: Nurse Practitioner

## 2014-09-02 ENCOUNTER — Emergency Department (HOSPITAL_COMMUNITY)
Admission: EM | Admit: 2014-09-02 | Discharge: 2014-09-02 | Disposition: A | Discharge status: Home / Self Care | Payer: Medicaid Other | Attending: Emergency Medicine | Admitting: Emergency Medicine

## 2014-09-02 ENCOUNTER — Encounter (HOSPITAL_COMMUNITY): Payer: Self-pay | Admitting: *Deleted

## 2014-09-02 DIAGNOSIS — Z87828 Personal history of other (healed) physical injury and trauma: Secondary | ICD-10-CM | POA: Diagnosis not present

## 2014-09-02 DIAGNOSIS — Z8679 Personal history of other diseases of the circulatory system: Secondary | ICD-10-CM | POA: Diagnosis not present

## 2014-09-02 DIAGNOSIS — K088 Other specified disorders of teeth and supporting structures: Secondary | ICD-10-CM | POA: Diagnosis not present

## 2014-09-02 DIAGNOSIS — J029 Acute pharyngitis, unspecified: Secondary | ICD-10-CM | POA: Diagnosis present

## 2014-09-02 DIAGNOSIS — Z8619 Personal history of other infectious and parasitic diseases: Secondary | ICD-10-CM | POA: Insufficient documentation

## 2014-09-02 DIAGNOSIS — Z87891 Personal history of nicotine dependence: Secondary | ICD-10-CM | POA: Insufficient documentation

## 2014-09-02 DIAGNOSIS — K0889 Other specified disorders of teeth and supporting structures: Secondary | ICD-10-CM

## 2014-09-02 DIAGNOSIS — Z8742 Personal history of other diseases of the female genital tract: Secondary | ICD-10-CM | POA: Insufficient documentation

## 2014-09-02 DIAGNOSIS — Z87440 Personal history of urinary (tract) infections: Secondary | ICD-10-CM | POA: Insufficient documentation

## 2014-09-02 DIAGNOSIS — J039 Acute tonsillitis, unspecified: Secondary | ICD-10-CM

## 2014-09-02 MED ORDER — PENICILLIN V POTASSIUM 500 MG PO TABS: 500.0000 mg | ORAL_TABLET | Freq: Three times a day (TID) | ORAL | Status: DC

## 2014-09-02 NOTE — ED Notes (Signed)
Pt reporting URI symptoms for past couple days.

## 2014-09-02 NOTE — ED Provider Notes (Signed)
CSN: [id number]     Arrival date & time 09/02/14  2106 History   First MD Initiated Contact with Patient 09/02/14 2129     Chief Complaint  Patient presents with  . URI     (Consider location/radiation/quality/duration/timing/severity/associated sxs/prior Treatment) HPI  Lori Johnston is a 24 y.o. female  who presents for evaluation of sore throat and tooth pain. Symptom onset 2 days ago. Tooth hurts since she was eating a piece of beef jerky and felt something get caught in the right lower molar. The sore throat is worse with swallowing. She denies fever, chills, jaw pain, weakness or dizziness. She has 2 children, being evaluated, today for URI symptoms. There are no other known modifying factors.    Past Medical History  Diagnosis Date  . Bronchitis   . Migraine   . History of chlamydia   . Vaginal discharge 12/10/2012  . BV (bacterial vaginosis) 12/10/2012  . Dyspareunia 12/10/2012  . PID (pelvic inflammatory disease)   . History of PID 07/16/2013  . RUQ pain 07/16/2013  . Yeast infection 07/16/2013  . Contraceptive education 07/16/2013  . Nexplanon insertion 08/27/2013  . UTI (lower urinary tract infection)   . Abnormal uterine bleeding (AUB) 05/28/2014  . Decreased appetite 05/28/2014  . Umbilical hernia 08/07/6809  . Strain of rectus abdominis muscle 06/10/2014  . Weight loss 07/06/2014  . Nexplanon in place 07/06/2014   Past Surgical History  Procedure Laterality Date  . Wisdom tooth extraction    . Dilation and curettage of uterus N/A 03/18/2012    Procedure: DILATATION AND CURETTAGE;  Surgeon: Mora Bellman, MD;  Location: [facility];  Service: Gynecology;  Laterality: N/A;  . Tooth pulled    . Umbilical hernia repair N/A 08/18/2014    Procedure: UMBILICAL HERNIORRHAPHY;  Surgeon: [name], MD;  Location: [facility];  Service: General;  Laterality: N/A;  . Insertion of mesh N/A 08/18/2014    Procedure: INSERTION OF MESH;  Surgeon: [name], MD;  Location: [facility];  Service:  General;  Laterality: N/A;   Family History  Problem Relation Age of Onset  . Anesthesia problems Neg Hx   . Hypotension Neg Hx   . Malignant hyperthermia Neg Hx   . Pseudochol deficiency Neg Hx   . Colon cancer Neg Hx   . Diabetes Cousin   . Hypertension Cousin   . Ulcers Cousin   . Heart attack Other     maternal great great grandma  . Deafness Paternal Grandmother    Social History  Substance Use Topics  . Smoking status: Former Smoker -- 0.20 packs/day for 2 years    Types: Cigarettes    Quit date: 08/12/2010  . Smokeless tobacco: Never Used  . Alcohol Use: 0.6 oz/week    1 Glasses of wine per week     Comment: rarely (holidays and family events)   OB History    Gravida Para Term Preterm AB TAB SAB Ectopic Multiple Living   2 2 1 1  0 0 0 0 0 2     Review of Systems  All other systems reviewed and are negative.     Allergies  Bee venom and Other  Home Medications   Prior to Admission medications   Medication Sig Start Date End Date Taking? Authorizing Provider  etonogestrel (NEXPLANON) 68 MG IMPL implant Inject 1 each into the skin once.    Historical Provider, MD  oxyCODONE-acetaminophen (PERCOCET) 7.5-325 MG per tablet Take 1-2 tablets by mouth every 4 (four)  hours as needed. Patient not taking: Reported on 09/02/2014 08/18/14   [name], MD  penicillin v potassium (VEETID) 500 MG tablet Take 1 tablet (500 mg total) by mouth 3 (three) times daily. 09/02/14   Daleen Bo, MD   BP 100/55 mmHg  Pulse 86  Temp(Src) 98.6 F (37 C) (Oral)  Resp 14  Ht 5\' 8"  (1.727 m)  Wt 137 lb (62.143 kg)  BMI 20.84 kg/m2  SpO2 100%  LMP 08/17/2014 Physical Exam  Constitutional: She is oriented to person, place, and time. She appears well-developed and well-nourished.  HENT:  Head: Normocephalic and atraumatic.  Right Ear: External ear normal.  Left Ear: External ear normal.  No tonsillar hypertrophy. Small amount of ex date on right tonsil. No peritonsillar swelling.  No trismus. She has a large filling her right lower molar, part of which appears to have been displaced. No associated gum swelling. No bleeding.  Eyes: Conjunctivae and EOM are normal. Pupils are equal, round, and reactive to light.  Neck: Normal range of motion and phonation normal. Neck supple.  No tender cervical adenopathy.  Cardiovascular: Normal rate, regular rhythm and normal heart sounds.   Pulmonary/Chest: Effort normal and breath sounds normal. She exhibits no bony tenderness.  Abdominal: Soft. There is no tenderness.  Musculoskeletal: Normal range of motion.  Neurological: She is alert and oriented to person, place, and time. No cranial nerve deficit or sensory deficit. She exhibits normal muscle tone. Coordination normal.  Skin: Skin is warm, dry and intact.  Psychiatric: She has a normal mood and affect. Her behavior is normal. Judgment and thought content normal.  Nursing note and vitals reviewed.   ED Course  Procedures (including critical care time)  Findings discussed with patient, all questions were answered.  Labs Review Labs Reviewed - No data to display  Imaging Review No results found. I have personally reviewed and evaluated these images and lab results as part of my medical decision-making.   EKG Interpretation None      MDM   Final diagnoses:  Tonsillitis  Toothache    Tonsillitis, possibly streptococcal, with toothache. No evidence for dental infection.  Nursing Notes Reviewed/ Care Coordinated Applicable Imaging Reviewed Interpretation of Laboratory Data incorporated into ED treatment  The patient appears reasonably screened and/or stabilized for discharge and I doubt any other medical condition or other [facility] requiring further screening, evaluation, or treatment in the ED at this time prior to discharge.  Plan: Home Medications- Pen-Vee K, ibuprofen for pain; Home Treatments- rest, gargle with saltwater.; return here if the recommended  treatment, does not improve the symptoms; Recommended follow up- PCP, when necessary   Daleen Bo, MD 09/02/14 2156

## 2014-09-02 NOTE — Discharge Instructions (Signed)
Take ibuprofen, for pain.   Tonsillitis Tonsillitis is an infection of the throat that causes the tonsils to become red, tender, and swollen. Tonsils are collections of lymphoid tissue at the back of the throat. Each tonsil has crevices (crypts). Tonsils help fight nose and throat infections and keep infection from spreading to other parts of the body for the first 18 months of life.  CAUSES Sudden (acute) tonsillitis is usually caused by infection with streptococcal bacteria. Long-lasting (chronic) tonsillitis occurs when the crypts of the tonsils become filled with pieces of food and bacteria, which makes it easy for the tonsils to become repeatedly infected. SYMPTOMS  Symptoms of tonsillitis include:  A sore throat, with possible difficulty swallowing.  White patches on the tonsils.  Fever.  Tiredness.  New episodes of snoring during sleep, when you did not snore before.  Small, foul-smelling, yellowish-white pieces of material (tonsilloliths) that you occasionally cough up or spit out. The tonsilloliths can also cause you to have bad breath. DIAGNOSIS Tonsillitis can be diagnosed through a physical exam. Diagnosis can be confirmed with the results of lab tests, including a throat culture. TREATMENT  The goals of tonsillitis treatment include the reduction of the severity and duration of symptoms and prevention of associated conditions. Symptoms of tonsillitis can be improved with the use of steroids to reduce the swelling. Tonsillitis caused by bacteria can be treated with antibiotic medicines. Usually, treatment with antibiotic medicines is started before the cause of the tonsillitis is known. However, if it is determined that the cause is not bacterial, antibiotic medicines will not treat the tonsillitis. If attacks of tonsillitis are severe and frequent, your health care provider may recommend surgery to remove the tonsils (tonsillectomy). HOME CARE INSTRUCTIONS   Rest as much as  possible and get plenty of sleep.  Drink plenty of fluids. While the throat is very sore, eat soft foods or liquids, such as sherbet, soups, or instant breakfast drinks.  Eat frozen ice pops.  Gargle with a warm or cold liquid to help soothe the throat. Mix 1/4 teaspoon of salt and 1/4 teaspoon of baking soda in 8 oz of water. SEEK MEDICAL CARE IF:   Large, tender lumps develop in your neck.  A rash develops.  A green, yellow-brown, or bloody substance is coughed up.  You are unable to swallow liquids or food for 24 hours.  You notice that only one of the tonsils is swollen. SEEK IMMEDIATE MEDICAL CARE IF:   You develop any new symptoms such as vomiting, severe headache, stiff neck, chest pain, or trouble breathing or swallowing.  You have severe throat pain along with drooling or voice changes.  You have severe pain, unrelieved with recommended medications.  You are unable to fully open the mouth.  You develop redness, swelling, or severe pain anywhere in the neck.  You have a fever. MAKE SURE YOU:   Understand these instructions.  Will watch your condition.  Will get help right away if you are not doing well or get worse. Document Released: 09/27/2004 Document Revised: 05/04/2013 Document Reviewed: 06/06/2012 [facility] Patient Information 2015 [place], [place]. This information is not intended to replace advice given to you by your health care provider. Make sure you discuss any questions you have with your health care provider.  Dental Pain Toothache is pain in or around a tooth. It may get worse with chewing or with cold or heat.  HOME CARE  Your dentist may use a numbing medicine during treatment. If so,  you may need to avoid eating until the medicine wears off. Ask your dentist about this.  Only take medicine as told by your dentist or doctor.  Avoid chewing food near the painful tooth until after all treatment is done. Ask your dentist about this. GET HELP RIGHT  AWAY IF:   The problem gets worse or new problems appear.  You have a fever.  There is redness and puffiness (swelling) of the face, jaw, or neck.  You cannot open your mouth.  There is pain in the jaw.  There is very bad pain that is not helped by medicine. MAKE SURE YOU:   Understand these instructions.  Will watch your condition.  Will get help right away if you are not doing well or get worse. Document Released: 06/06/2007 Document Revised: 03/12/2011 Document Reviewed: 06/06/2007 [facility] Patient Information 2015 [place], [place]. This information is not intended to replace advice given to you by your health care provider. Make sure you discuss any questions you have with your health care provider.   Emergency Department Resource Guide 1) Find a Doctor and Pay Out of Pocket Although you won't have to find out who is covered by your insurance plan, it is a good idea to ask around and get recommendations. You will then need to call the office and see if the doctor you have chosen will accept you as a new patient and what types of options they offer for patients who are self-pay. Some doctors offer discounts or will set up payment plans for their patients who do not have insurance, but you will need to ask so you aren't surprised when you get to your appointment.  2) Contact Your Local Health Department Not all health departments have doctors that can see patients for sick visits, but many do, so it is worth a call to see if yours does. If you don't know where your local health department is, you can check in your phone book. The CDC also has a tool to help you locate your state's health department, and many state websites also have listings of all of their local health departments.  3) Find a [facility] If your illness is not likely to be very severe or complicated, you may want to try a walk in clinic. These are popping up all over the country in pharmacies, drugstores, and  shopping centers. They're usually staffed by nurse practitioners or physician assistants that have been trained to treat common illnesses and complaints. They're usually fairly quick and inexpensive. However, if you have serious medical issues or chronic medical problems, these are probably not your best option.  No Primary Care Doctor: - Call Health Connect at  [phone] - they can help you locate a primary care doctor that  accepts your insurance, provides certain services, etc. - Physician Referral Service- [phone]  Chronic Pain Problems: Organization         Address  Phone   Notes  [facility]  [phone] Patients need to be referred by their primary care doctor.   Medication Assistance: Organization         Address  Phone   Notes  [facility] Medication [facility] [place]., [place], [place] [address] [phone] --Must be a resident of [facility] -- Must have NO insurance coverage whatsoever (no Medicaid/ Medicare, etc.) -- The pt. MUST have a primary care doctor that directs their care regularly and follows them in the community  Med[facility]ist  [phone]   [employer]  [phone]    Agencies that provide inexpensive medical care: Organization         Address  Phone   Notes  [place]  [phone]   [facility]    [phone]   [facility] [place], [place] [address] [phone]   [place] [address] [place] [phone]   Planned Parenthood    [phone]   [facility]    [phone]   [place] [address], [place] Phone:  [phone], Fax:  [phone] Hours of Operation:  9 am - 6 pm, M-F.  Also accepts Medicaid/Medicare and self-pay.  [facility] [place], [address], [place] Phone: [phone],  Fax: [phone]. Hours of Operation:  8:30 am - 5:30 pm, M-F.  Also accepts Medicaid and self-pay.  [facility] [place] [address] [place] Phone: [phone]   [place], [place], [place] [phone], Ext. [phone] Mondays & Thursdays: 7-9 AM.  First 15 patients are seen on a first come, first serve basis.    [place] Providers:  Organization         Address  Phone   Notes  [facility] [address],  [phone] Also accepts self-pay patients.  [facility] [address], [place]  [phone]   [place], [address], [place] [phone]   [facility] [address] [place] [phone]   Lucianne Lei [address], [place]   [phone] Only accepts [place] Access [place] patients after they have their name applied to their card.   Self-Pay (no insurance) in [facility]:  Organization         Address  Phone   Notes  Sickle Cell Patients, [facility] [place] [phone]   [facility] [place] [phone]   [facility] [place]  [place], [place], [place] [phone]   [facility]/Dr. Osei-Bonsu  [address] [place] or [address], [place] [phone] Phone number for both [place] and [place] locations is the same.  Urgent Medical and [facility] [address], [place] [phone]   [facility] [address] [place] or [address] [phone]   [facility] [address], [place] [phone], phone; [phone], fax Sees patients 1st and 3rd Saturday of every month.  Must not qualify for public or private  insurance (i.e. Medicaid, Medicare, [place] Health Choice, Veterans' Benefits)  Household income should be no more than 200% of the poverty level The clinic cannot treat you if you are pregnant or think you are pregnant  Sexually transmitted diseases are not treated at the clinic.    Dental Care: Organization         Address  Phone  Notes  [facility] Department of [facility] [phone] Accepts children up to age 83 who are enrolled in [place] or [place] Health Choice;  pregnant women with a Medicaid card; and children who have applied for Medicaid or [place] Health Choice, but were declined, whose parents can pay a reduced fee at time of service.  [facility] Department of [facility]  [address], [place] [phone] Accepts children up to age 68 who are enrolled in [place] or [place]; pregnant women with a Medicaid card; and children who have applied for Medicaid or [place] Health Choice, but were declined, whose parents can pay a reduced fee at time of service.  [facility] Adult [facility] Access PRO[facility]  [place] [phone] Patients are seen by appointment only. Walk-ins are not accepted. [place] will see patients 41 years of age and older. Monday - Tuesday (8am-5pm) Most Wednesdays (8:30-5pm) $30 per visit, cash only  [facility] Adult Dental Access PROGRAM  [address], [facility] [phone] Patients are seen by appointment only. Walk-ins are not accepted. [place] will see patients 68 years of age and older. One Wednesday Evening (Monthly: Volunteer Based).  $30 per visit, cash only  [place]  [phone] for adults; Children under age 48, call Graduate Pediatric Dentistry at [phone]. Children aged 36-14, please call [phone] to request a pediatric application.  Dental services are provided in all areas of dental care  including fillings, crowns and bridges, complete and partial dentures, implants, gum treatment, root canals, and extractions. Preventive care is also provided. Treatment is provided to both adults and children. Patients are selected via a lottery and there is often a waiting list.   [facility] [address] [place]  [phone] [email or web address]   [facility] [address] [place], [place] [phone], Ext. [phone] Second and Fourth Thursday of each month, opens at 6:30 AM; Clinic ends at 9 AM.  Patients are seen on a first-come first-served basis, and a limited number are seen during each clinic.   [facility]  [address] Hillard Danker [place], [place] [phone]   Eligibility Requirements You must have lived in [place], [place], or [place] counties for at least the last three months.   You cannot be eligible for state or federal sponsored [employer], including [employer], [place], or [employer].   You generally cannot be eligible for healthcare insurance through your employer.    How to apply: Eligibility screenings are held every Tuesday and Wednesday afternoon from 1:00 pm until 4:00 pm. You do not need an appointment for the interview!  [facility] [address], [place], [place]   [place]  [place] Department  [place]  [phone]    Behavioral Health Resources in the Community: Intensive Outpatient Programs Organization         Address  Phone  Notes  [facility] [place]. [address], [place], [place] [phone]   [facility]patient [address] [place], [place]   ADS: Alcohol & Drug S[facility]cs [address] [place], [place]   [place] [address],  [place], [place] or [phone]     Substance Abuse Resources Organization         Address  Phone  Notes  Alcohol and Drug Services  [phone]   [facility]  [phone]   [facility] [place]  [phone]  Chinita Pester  [phone]   Residential & Outpatient Substance Abuse Program  [phone]   Psychological Services Organization         Address  Phone  Notes  [facility] [place]  [place]  [phone]   [place] [address], [place] or [phone]    Mobile Crisis Teams Organization         Address  Phone  Notes  [facility], Mobile Crisis Care Unit  [phone]   [facility]  [address] [place], [place]   [place] [address], [place] [phone]    Self-Help/Support Groups Organization         Address  Phone             Notes  [place]. of [place] - variety of support groups  [place] Call for more information  Narcotics Anonymous (NA), [facility] [address], [employer] [place]  2 meetings at this location   Special educational needs teacher         Address  Phone  Notes  [facility] Residential Treatment [place],    [place]  [phone]   [facility]  [address], [place] [phone], [place], [place]   [facility] [place], [place] [phone] Admissions: 8am-3pm M-F  Incentives Substance [place] [address]    [place], [place] [phone]   [facility] [address] [place], [place], [place]   [facility] [address],  [place], [place]   [facility] Programs - Intensive Outpatient [address], [place], [place]   [facility] [place].,  [place], [place] [phone] or [phone]   Residential Treatment  Services (RTS) [address], [place], [place], [place] Accepts Medicaid  [facility] [place] [address]  [place] [phone] Substance Abuse/Addiction Treatment   [facility] Organization         Address  Phone  Notes  [facility]  [phone]   Domenic Schwab, PhD [address] [place]lis [place], [place]   [phone] or [phone]   [place], [place] [phone]   [facility] [address] [place], [place] [phone] Insurance/Medicaid/sponsorship through [facility] and Families [address] [place]                                    [place], [place] [phone] [place] [address], [place] [phone]    Dr. Adele Schilder  [phone]   [facility] Dept. 1) [address], [place] 2) [place] 3)  [place], [place] [phone]  [phone]   [place] [phone] or [phone] (After Hours)

## 2014-09-08 ENCOUNTER — Encounter: Payer: Self-pay | Admitting: Adult Health

## 2014-09-08 ENCOUNTER — Ambulatory Visit (INDEPENDENT_AMBULATORY_CARE_PROVIDER_SITE_OTHER): Payer: Medicaid Other | Admitting: Adult Health

## 2014-09-08 VITALS — BP 98/60 | HR 92 | Ht 68.0 in | Wt 138.5 lb

## 2014-09-08 DIAGNOSIS — R1033 Periumbilical pain: Secondary | ICD-10-CM

## 2014-09-08 MED ORDER — HYDROCODONE-ACETAMINOPHEN 5-325 MG PO TABS: 1.0000 | ORAL_TABLET | Freq: Four times a day (QID) | ORAL | Status: DC | PRN

## 2014-09-08 NOTE — Patient Instructions (Signed)
Pap and physical in October

## 2014-09-08 NOTE — Progress Notes (Signed)
Subjective:     Patient ID: Lori Johnston, female   DOB: 24-Feb-1990, 24 y.o.   MRN: [id number]  HPI Lori Johnston is a 24 year old black female back in follow up of weight loss and umbilical hernia.She had hernia repair 8/17 by Dr Arnoldo Morale and still has pain around navel,but she can eat and has gained 4.5 lbs.No bleeding with nexplanon at present.  Review of Systems Patient denies any headaches, hearing loss, fatigue, blurred vision, shortness of breath, chest pain, problems with bowel movements, urination, or intercourse. No joint pain or mood swings.See HPI for positives. Reviewed past medical,surgical, social and family history. Reviewed medications and allergies.     Objective:   Physical Exam BP 98/60 mmHg  Pulse 92  Ht 5\' 8"  (1.727 m)  Wt 138 lb 8 oz (62.823 kg)  BMI 21.06 kg/m2  LMP 08/16/2016Skin warm and dry, abdomen soft, tender near navel,but has healed well.She requests refill pain meds, child wasted the last of her percocet.[facility] is [facility].    Assessment:     Peri umbilical pain     Plan:     Return in 1 month for pap and physical Rx norco 5-325 mg #30 take 1 every 6 hours prn pain no refill

## 2014-09-09 ENCOUNTER — Encounter: Payer: Self-pay | Admitting: *Deleted

## 2014-09-09 ENCOUNTER — Encounter: Payer: Self-pay | Admitting: Adult Health

## 2014-09-10 ENCOUNTER — Encounter: Payer: Self-pay | Admitting: Gastroenterology

## 2014-09-10 ENCOUNTER — Ambulatory Visit: Payer: Medicaid Other | Admitting: Gastroenterology

## 2014-09-10 ENCOUNTER — Telehealth: Payer: Self-pay | Admitting: Gastroenterology

## 2014-09-10 NOTE — Telephone Encounter (Signed)
PATIENT WAS A NO SHOW AND LETTER SENT  °

## 2014-10-20 ENCOUNTER — Other Ambulatory Visit: Payer: Medicaid Other | Admitting: Adult Health

## 2014-11-02 ENCOUNTER — Other Ambulatory Visit: Payer: Medicaid Other | Admitting: Adult Health

## 2014-11-02 ENCOUNTER — Encounter: Payer: Self-pay | Admitting: Adult Health

## 2014-11-30 ENCOUNTER — Other Ambulatory Visit: Payer: Medicaid Other | Admitting: Adult Health

## 2014-12-09 ENCOUNTER — Ambulatory Visit (INDEPENDENT_AMBULATORY_CARE_PROVIDER_SITE_OTHER): Payer: Medicaid Other | Admitting: Adult Health

## 2014-12-09 ENCOUNTER — Encounter: Payer: Self-pay | Admitting: Adult Health

## 2014-12-09 VITALS — BP 110/60 | HR 86 | Ht 68.5 in | Wt 135.0 lb

## 2014-12-09 DIAGNOSIS — R062 Wheezing: Secondary | ICD-10-CM

## 2014-12-09 DIAGNOSIS — Z01419 Encounter for gynecological examination (general) (routine) without abnormal findings: Secondary | ICD-10-CM

## 2014-12-09 DIAGNOSIS — Z975 Presence of (intrauterine) contraceptive device: Secondary | ICD-10-CM

## 2014-12-09 DIAGNOSIS — Z0000 Encounter for general adult medical examination without abnormal findings: Secondary | ICD-10-CM | POA: Diagnosis not present

## 2014-12-09 DIAGNOSIS — Z113 Encounter for screening for infections with a predominantly sexual mode of transmission: Secondary | ICD-10-CM

## 2014-12-09 HISTORY — DX: Wheezing: R06.2

## 2014-12-09 MED ORDER — ALBUTEROL SULFATE HFA 108 (90 BASE) MCG/ACT IN AERS: 2.0000 | INHALATION_SPRAY | Freq: Four times a day (QID) | RESPIRATORY_TRACT | Status: DC | PRN

## 2014-12-09 MED ORDER — CIPROFLOXACIN HCL 500 MG PO TABS: 500.0000 mg | ORAL_TABLET | Freq: Two times a day (BID) | ORAL | Status: DC

## 2014-12-09 NOTE — Progress Notes (Signed)
Patient ID: Lori Johnston, female   DOB: Aug 06, 1990, 24 y.o.   MRN: [id number] History of Present Illness:  Lori Johnston is a 24 year old black female, G2P2, in for a well woman gyn exam, she had a normal pap 05/07/13.She says she has been sick a month now, took Z pack from urgent care and had chest xray with ?bronchitis.She says sputum yellowish green and wheezes at night. PCP is [facility], [place], [place].   Current Medications, Allergies, Past Medical History, Past Surgical History, Family History and Social History were reviewed in [employer] record.     Review of Systems: Patient denies any headaches, hearing loss, fatigue, blurred vision, shortness of breath, chest pain, abdominal pain, problems with bowel movements, urination, or intercourse. No joint pain or mood swings.No problems with nexplanon. See HPI for positives.    Physical Exam:BP 110/60 mmHg  Pulse 86  Ht 5' 8.5" (1.74 m)  Wt 135 lb (61.236 kg)  BMI 20.23 kg/m2  LMP 11/12/2014 General:  Well developed, well nourished, no acute distress Skin:  Warm and dry Neck:  Midline trachea, normal thyroid, good ROM, no lymphadenopathy Lungs; has expiratory wheezing on left Breast:  No dominant palpable mass, retraction, or nipple discharge Cardiovascular: Regular rate and rhythm Abdomen:  Soft, non tender, no hepatosplenomegaly Pelvic:  External genitalia is normal in appearance, no lesions.  The vagina is normal in appearance. Urethra has no lesions or masses. The cervix is bulbous.GC/CHL obtained. Uterus is felt to be normal size, shape, and contour.  No adnexal masses or tenderness noted.Bladder is non tender, no masses felt. Extremities/musculoskeletal:  No swelling or varicosities noted, no clubbing or cyanosis Psych:  No mood changes, alert and cooperative,seems happy   Impression:  Well woman gyn exam no pap nexplanon in place STD screening Wheezing    Plan: GC/CHL sent  Get chest xray  today Rx cipro 500 mg #14 1 bid x 7 days Rx ventolin inhaler with 2 refills Physical in 1 year Follow up if needed or see PCP

## 2014-12-09 NOTE — Patient Instructions (Signed)
Increase fluids Get chest xray Physical in 1 year,or sooner if needed

## 2014-12-11 LAB — GC/CHLAMYDIA PROBE AMP
Chlamydia trachomatis, NAA: NEGATIVE
Neisseria gonorrhoeae by PCR: NEGATIVE

## 2014-12-15 ENCOUNTER — Encounter: Payer: Self-pay | Admitting: Gastroenterology

## 2014-12-15 ENCOUNTER — Telehealth: Payer: Self-pay | Admitting: Gastroenterology

## 2014-12-15 ENCOUNTER — Ambulatory Visit: Payer: Medicaid Other | Admitting: Gastroenterology

## 2014-12-15 NOTE — Telephone Encounter (Signed)
Second no-show.  IF there is a 3rd, needs to be discharged from practice.

## 2014-12-15 NOTE — Telephone Encounter (Signed)
Noted  

## 2014-12-15 NOTE — Telephone Encounter (Signed)
PATIENT WAS A NO SHOW AND LETTER SENT  °

## 2014-12-19 ENCOUNTER — Emergency Department (HOSPITAL_COMMUNITY)
Admission: EM | Admit: 2014-12-19 | Discharge: 2014-12-19 | Disposition: A | Discharge status: Home / Self Care | Payer: Medicaid Other | Attending: Emergency Medicine | Admitting: Emergency Medicine

## 2014-12-19 ENCOUNTER — Encounter (HOSPITAL_COMMUNITY): Payer: Self-pay | Admitting: Emergency Medicine

## 2014-12-19 ENCOUNTER — Emergency Department (HOSPITAL_COMMUNITY): Payer: Medicaid Other

## 2014-12-19 DIAGNOSIS — Z8742 Personal history of other diseases of the female genital tract: Secondary | ICD-10-CM | POA: Insufficient documentation

## 2014-12-19 DIAGNOSIS — K08409 Partial loss of teeth, unspecified cause, unspecified class: Secondary | ICD-10-CM | POA: Diagnosis not present

## 2014-12-19 DIAGNOSIS — Z792 Long term (current) use of antibiotics: Secondary | ICD-10-CM | POA: Insufficient documentation

## 2014-12-19 DIAGNOSIS — H9203 Otalgia, bilateral: Secondary | ICD-10-CM | POA: Diagnosis not present

## 2014-12-19 DIAGNOSIS — R05 Cough: Secondary | ICD-10-CM | POA: Diagnosis not present

## 2014-12-19 DIAGNOSIS — R059 Cough, unspecified: Secondary | ICD-10-CM

## 2014-12-19 DIAGNOSIS — J3489 Other specified disorders of nose and nasal sinuses: Secondary | ICD-10-CM | POA: Diagnosis not present

## 2014-12-19 DIAGNOSIS — Z87891 Personal history of nicotine dependence: Secondary | ICD-10-CM | POA: Insufficient documentation

## 2014-12-19 DIAGNOSIS — Z7951 Long term (current) use of inhaled steroids: Secondary | ICD-10-CM | POA: Insufficient documentation

## 2014-12-19 DIAGNOSIS — M791 Myalgia: Secondary | ICD-10-CM | POA: Insufficient documentation

## 2014-12-19 DIAGNOSIS — Z8679 Personal history of other diseases of the circulatory system: Secondary | ICD-10-CM | POA: Insufficient documentation

## 2014-12-19 DIAGNOSIS — K0889 Other specified disorders of teeth and supporting structures: Secondary | ICD-10-CM | POA: Insufficient documentation

## 2014-12-19 DIAGNOSIS — Z8619 Personal history of other infectious and parasitic diseases: Secondary | ICD-10-CM | POA: Insufficient documentation

## 2014-12-19 DIAGNOSIS — R1111 Vomiting without nausea: Secondary | ICD-10-CM | POA: Insufficient documentation

## 2014-12-19 MED ORDER — AMOXICILLIN 500 MG PO CAPS: 500.0000 mg | ORAL_CAPSULE | Freq: Three times a day (TID) | ORAL | Status: DC

## 2014-12-19 NOTE — Discharge Instructions (Signed)

## 2014-12-19 NOTE — ED Notes (Signed)
Patient c/o cough that is occasional productive with thick green sputum. Patient also c/o sore throat, sinus pressure, bilateral ear ache (left ear notable worse), and right lower dental pain. Per patient cough and sinus pressure since October, progressively getting worse. Denies any drainage from ears or fevers.

## 2014-12-19 NOTE — ED Provider Notes (Signed)
CSN: [id number]     Arrival date & time 12/19/14  1140 History  By signing my name below, I, Altamease Oiler, attest that this documentation has been prepared under the direction and in the presence of Ripley Fraise, MD. Electronically Signed: Altamease Oiler, ED Scribe. 12/19/2014. 12:57 PM    Chief Complaint  Patient presents with  . Cough    Patient is a 24 y.o. female presenting with cough. The history is provided by the patient. No language interpreter was used.  Cough Cough characteristics:  Productive Sputum characteristics:  Green Severity:  Moderate Onset quality:  Gradual Duration: 11. Timing:  Intermittent Progression:  Unchanged Chronicity:  New Smoker: no   Relieved by:  Nothing Worsened by:  Nothing tried Ineffective treatments:  None tried Associated symptoms: ear pain, myalgias, shortness of breath, sinus congestion and sore throat   Associated symptoms: no chest pain, no fever, no rash and no rhinorrhea    Lori Johnston is a 24 y.o. female who presents to the Emergency Department complaining of an intermittently productive cough with onset 10/29/14. Associated symptoms include sore throat, sinus congestion, bilateral ear pain (L>R), right lower dental pain, "white" noted at the back of the throat, occasional emesis, SOB at night, and myalgias. Pt denies fever. She is a former smoker.  Past Medical History  Diagnosis Date  . Bronchitis   . Migraine   . History of chlamydia   . Vaginal discharge 12/10/2012  . BV (bacterial vaginosis) 12/10/2012  . Dyspareunia 12/10/2012  . PID (pelvic inflammatory disease)   . History of PID 07/16/2013  . RUQ pain 07/16/2013  . Yeast infection 07/16/2013  . Contraceptive education 07/16/2013  . Nexplanon insertion 08/27/2013  . UTI (lower urinary tract infection)   . Abnormal uterine bleeding (AUB) 05/28/2014  . Decreased appetite 05/28/2014  . Umbilical hernia [id number]  . Strain of rectus abdominis muscle 06/10/2014  . Weight  loss 07/06/2014  . Nexplanon in place 07/06/2014  . Wheezing on expiration 12/09/2014   Past Surgical History  Procedure Laterality Date  . Wisdom tooth extraction    . Dilation and curettage of uterus N/A 03/18/2012    Procedure: DILATATION AND CURETTAGE;  Surgeon: Mora Bellman, MD;  Location: [facility];  Service: Gynecology;  Laterality: N/A;  . Tooth pulled    . Umbilical hernia repair N/A 08/18/2014    Procedure: UMBILICAL HERNIORRHAPHY;  Surgeon: [name], MD;  Location: [facility];  Service: General;  Laterality: N/A;  . Insertion of mesh N/A 08/18/2014    Procedure: INSERTION OF MESH;  Surgeon: [name], MD;  Location: [facility];  Service: General;  Laterality: N/A;   Family History  Problem Relation Age of Onset  . Anesthesia problems Neg Hx   . Hypotension Neg Hx   . Malignant hyperthermia Neg Hx   . Pseudochol deficiency Neg Hx   . Colon cancer Neg Hx   . Diabetes Cousin   . Hypertension Cousin   . Ulcers Cousin   . Heart attack Other     maternal great great grandma  . Deafness Paternal Grandmother    Social History  Substance Use Topics  . Smoking status: Former Smoker -- 0.20 packs/day for 2 years    Types: Cigarettes    Quit date: 08/12/2010  . Smokeless tobacco: Never Used  . Alcohol Use: 0.6 oz/week    1 Glasses of wine per week     Comment: rarely (holidays and family events)   OB History  Gravida Para Term Preterm AB TAB SAB Ectopic Multiple Living   2 2 1 1  0 0 0 0 0 2     Review of Systems  Constitutional: Negative for fever.  HENT: Positive for congestion, dental problem, ear pain and sore throat. Negative for rhinorrhea and trouble swallowing.   Respiratory: Positive for cough and shortness of breath.   Cardiovascular: Negative for chest pain.  Gastrointestinal: Positive for vomiting. Negative for nausea, diarrhea and anal bleeding.  Musculoskeletal: Positive for myalgias.  Skin: Negative for rash.    Allergies  Bee venom and Other  Home  Medications   Prior to Admission medications   Medication Sig Start Date End Date Taking? Authorizing Provider  albuterol (PROVENTIL HFA;VENTOLIN HFA) 108 (90 BASE) MCG/ACT inhaler Inhale 2 puffs into the lungs every 6 (six) hours as needed for wheezing or shortness of breath. 12/09/14   Estill Dooms, NP  ciprofloxacin (CIPRO) 500 MG tablet Take 1 tablet (500 mg total) by mouth 2 (two) times daily. 12/09/14   Estill Dooms, NP  etonogestrel (NEXPLANON) 68 MG IMPL implant Inject 1 each into the skin once.    Historical Provider, MD  UNABLE TO FIND Headache shot-prn    Historical Provider, MD   BP 133/69 mmHg  Pulse 77  Temp(Src) 98.6 F (37 C) (Oral)  Resp 16  Ht 5\' 8"  (1.727 m)  Wt 148 lb (67.132 kg)  BMI 22.51 kg/m2  SpO2 100%  LMP 12/19/2014 Physical Exam CONSTITUTIONAL: Well developed/well nourished HEAD: Normocephalic/atraumatic EYES: EOMI/PERRL ENMT: Mucous membranes moist, bilateral TMs clear and intact, Uvula midline with no exudates or edema, tenderness to right lower teeth no abscess noted.  No trismus noted NECK: supple no meningeal signs SPINE/BACK:entire spine nontender CV: S1/S2 noted, no murmurs/rubs/gallops noted LUNGS: Lungs are clear to auscultation bilaterally, no apparent distress ABDOMEN: soft, nontender, no rebound or guarding,  NEURO: Pt is awake/alert/appropriate, moves all extremitiesx4.  No facial droop.   EXTREMITIES: pulses normal/equal, full ROM SKIN: warm, color normal PSYCH: no abnormalities of mood noted, alert and oriented to situation  ED Course  Procedures (including critical care time) DIAGNOSTIC STUDIES: Oxygen Saturation is 100% on RA,  normal by my interpretation.    COORDINATION OF CARE: 12:51 PM Discussed treatment plan which includes CXR and discharge with symptomatic treatment with pt at bedside and pt agreed to plan.  Will start abx for dental pain probable infection Otherwise stable for d/c home Imaging Review Dg Chest  2 View  12/19/2014  CLINICAL DATA:  Productive cough for 2 months, your pain, history of bronchitis. EXAM: CHEST  2 VIEW COMPARISON:  None. FINDINGS: The heart size and mediastinal contours are within normal limits. Both lungs are clear. The visualized skeletal structures are unremarkable. IMPRESSION: Lungs are clear and there is no evidence of acute cardiopulmonary abnormality. Electronically Signed   By: Franki Cabot M.D.   On: 12/19/2014 13:23   I have personally reviewed and evaluated these images as part of my medical decision-making.    MDM   Final diagnoses:  Cough  Pain, dental    Nursing notes including past medical history and social history reviewed and considered in documentation xrays/imaging reviewed by myself and considered during evaluation   I personally performed the services described in this documentation, which was scribed in my presence. The recorded information has been reviewed and is accurate.       Ripley Fraise, MD 12/19/14 1346

## 2014-12-19 NOTE — ED Notes (Signed)
Instructed pt to take all of antibiotics as prescribed. 

## 2015-01-08 ENCOUNTER — Emergency Department (HOSPITAL_COMMUNITY): Payer: Medicaid Other

## 2015-01-08 ENCOUNTER — Emergency Department (HOSPITAL_COMMUNITY)
Admission: EM | Admit: 2015-01-08 | Discharge: 2015-01-08 | Disposition: A | Discharge status: Home / Self Care | Payer: Medicaid Other | Attending: Emergency Medicine | Admitting: Emergency Medicine

## 2015-01-08 ENCOUNTER — Encounter (HOSPITAL_COMMUNITY): Payer: Self-pay | Admitting: Emergency Medicine

## 2015-01-08 DIAGNOSIS — S3992XA Unspecified injury of lower back, initial encounter: Secondary | ICD-10-CM | POA: Insufficient documentation

## 2015-01-08 DIAGNOSIS — S0990XA Unspecified injury of head, initial encounter: Secondary | ICD-10-CM | POA: Diagnosis present

## 2015-01-08 DIAGNOSIS — Z87891 Personal history of nicotine dependence: Secondary | ICD-10-CM | POA: Insufficient documentation

## 2015-01-08 DIAGNOSIS — Z8719 Personal history of other diseases of the digestive system: Secondary | ICD-10-CM | POA: Diagnosis not present

## 2015-01-08 DIAGNOSIS — Z8679 Personal history of other diseases of the circulatory system: Secondary | ICD-10-CM | POA: Diagnosis not present

## 2015-01-08 DIAGNOSIS — Z3202 Encounter for pregnancy test, result negative: Secondary | ICD-10-CM | POA: Diagnosis not present

## 2015-01-08 DIAGNOSIS — S299XXA Unspecified injury of thorax, initial encounter: Secondary | ICD-10-CM | POA: Insufficient documentation

## 2015-01-08 DIAGNOSIS — Y92410 Unspecified street and highway as the place of occurrence of the external cause: Secondary | ICD-10-CM | POA: Insufficient documentation

## 2015-01-08 DIAGNOSIS — Z8709 Personal history of other diseases of the respiratory system: Secondary | ICD-10-CM | POA: Diagnosis not present

## 2015-01-08 DIAGNOSIS — S0083XA Contusion of other part of head, initial encounter: Secondary | ICD-10-CM | POA: Insufficient documentation

## 2015-01-08 DIAGNOSIS — Z8742 Personal history of other diseases of the female genital tract: Secondary | ICD-10-CM | POA: Diagnosis not present

## 2015-01-08 DIAGNOSIS — Z79899 Other long term (current) drug therapy: Secondary | ICD-10-CM | POA: Insufficient documentation

## 2015-01-08 DIAGNOSIS — Z8669 Personal history of other diseases of the nervous system and sense organs: Secondary | ICD-10-CM | POA: Diagnosis not present

## 2015-01-08 DIAGNOSIS — Y9389 Activity, other specified: Secondary | ICD-10-CM | POA: Insufficient documentation

## 2015-01-08 DIAGNOSIS — S301XXA Contusion of abdominal wall, initial encounter: Secondary | ICD-10-CM | POA: Insufficient documentation

## 2015-01-08 DIAGNOSIS — Y998 Other external cause status: Secondary | ICD-10-CM | POA: Insufficient documentation

## 2015-01-08 DIAGNOSIS — Z87440 Personal history of urinary (tract) infections: Secondary | ICD-10-CM | POA: Insufficient documentation

## 2015-01-08 DIAGNOSIS — Z8619 Personal history of other infectious and parasitic diseases: Secondary | ICD-10-CM | POA: Diagnosis not present

## 2015-01-08 LAB — POC URINE PREG, ED: PREG TEST UR: NEGATIVE

## 2015-01-08 MED ORDER — ACETAMINOPHEN-CODEINE #3 300-30 MG PO TABS
2.0000 | ORAL_TABLET | Freq: Once | ORAL | Status: AC
  Administered 2015-01-08: 2 via ORAL
  Filled 2015-01-08: qty 2

## 2015-01-08 MED ORDER — ACETAMINOPHEN-CODEINE #3 300-30 MG PO TABS: 1.0000 | ORAL_TABLET | Freq: Four times a day (QID) | ORAL | Status: DC | PRN

## 2015-01-08 MED ORDER — IBUPROFEN 400 MG PO TABS
400.0000 mg | ORAL_TABLET | Freq: Once | ORAL | Status: AC
  Administered 2015-01-08: 400 mg via ORAL
  Filled 2015-01-08: qty 1

## 2015-01-08 MED ORDER — METHOCARBAMOL 500 MG PO TABS: 500.0000 mg | ORAL_TABLET | Freq: Three times a day (TID) | ORAL | Status: DC

## 2015-01-08 MED ORDER — METHOCARBAMOL 500 MG PO TABS
1000.0000 mg | ORAL_TABLET | Freq: Once | ORAL | Status: AC
  Administered 2015-01-08: 1000 mg via ORAL
  Filled 2015-01-08: qty 2
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
## 2015-01-18 ENCOUNTER — Ambulatory Visit (INDEPENDENT_AMBULATORY_CARE_PROVIDER_SITE_OTHER): Payer: Medicaid Other | Admitting: Adult Health

## 2015-01-18 ENCOUNTER — Encounter: Payer: Self-pay | Admitting: Adult Health

## 2015-01-18 VITALS — BP 110/64 | HR 78 | Ht 68.0 in | Wt 132.5 lb

## 2015-01-18 DIAGNOSIS — R110 Nausea: Secondary | ICD-10-CM | POA: Diagnosis not present

## 2015-01-18 DIAGNOSIS — Z975 Presence of (intrauterine) contraceptive device: Secondary | ICD-10-CM

## 2015-01-18 DIAGNOSIS — N926 Irregular menstruation, unspecified: Secondary | ICD-10-CM

## 2015-01-18 DIAGNOSIS — Z3202 Encounter for pregnancy test, result negative: Secondary | ICD-10-CM | POA: Diagnosis not present

## 2015-01-18 HISTORY — DX: Nausea: R11.0

## 2015-01-18 HISTORY — DX: Irregular menstruation, unspecified: N92.6

## 2015-01-18 LAB — POCT URINE PREGNANCY: PREG TEST UR: NEGATIVE

## 2015-01-18 MED ORDER — PROMETHAZINE HCL 25 MG PO TABS: 25.0000 mg | ORAL_TABLET | Freq: Four times a day (QID) | ORAL | Status: DC | PRN
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
## 2015-01-18 NOTE — Patient Instructions (Signed)
Follow up in 6 months to discuss birth control

## 2015-03-13 ENCOUNTER — Encounter (HOSPITAL_COMMUNITY): Payer: Self-pay | Admitting: *Deleted

## 2015-03-13 ENCOUNTER — Emergency Department (HOSPITAL_COMMUNITY)
Admission: EM | Admit: 2015-03-13 | Discharge: 2015-03-13 | Disposition: A | Discharge status: Home / Self Care | Payer: Medicaid Other | Attending: Emergency Medicine | Admitting: Emergency Medicine

## 2015-03-13 DIAGNOSIS — Z202 Contact with and (suspected) exposure to infections with a predominantly sexual mode of transmission: Secondary | ICD-10-CM | POA: Diagnosis not present

## 2015-03-13 DIAGNOSIS — R21 Rash and other nonspecific skin eruption: Secondary | ICD-10-CM | POA: Diagnosis not present

## 2015-03-13 NOTE — ED Notes (Signed)
Patient called for room 3X with no response 

## 2015-03-13 NOTE — ED Notes (Signed)
Called for patient in lobby, N/A.
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
## 2015-04-19 ENCOUNTER — Ambulatory Visit: Payer: Medicaid Other | Admitting: Adult Health

## 2015-04-21 ENCOUNTER — Encounter: Payer: Self-pay | Admitting: Adult Health

## 2015-04-21 ENCOUNTER — Ambulatory Visit (INDEPENDENT_AMBULATORY_CARE_PROVIDER_SITE_OTHER): Payer: Medicaid Other | Admitting: Adult Health

## 2015-04-21 VITALS — BP 100/60 | HR 76 | Ht 69.0 in | Wt 128.0 lb

## 2015-04-21 DIAGNOSIS — Z3202 Encounter for pregnancy test, result negative: Secondary | ICD-10-CM | POA: Diagnosis not present

## 2015-04-21 DIAGNOSIS — Z30013 Encounter for initial prescription of injectable contraceptive: Secondary | ICD-10-CM

## 2015-04-21 DIAGNOSIS — R634 Abnormal weight loss: Secondary | ICD-10-CM

## 2015-04-21 DIAGNOSIS — N926 Irregular menstruation, unspecified: Secondary | ICD-10-CM | POA: Diagnosis not present

## 2015-04-21 DIAGNOSIS — Z975 Presence of (intrauterine) contraceptive device: Secondary | ICD-10-CM | POA: Diagnosis not present

## 2015-04-21 DIAGNOSIS — Z309 Encounter for contraceptive management, unspecified: Secondary | ICD-10-CM

## 2015-04-21 DIAGNOSIS — R110 Nausea: Secondary | ICD-10-CM | POA: Diagnosis not present

## 2015-04-21 HISTORY — DX: Encounter for contraceptive management, unspecified: Z30.9

## 2015-04-21 LAB — POCT URINE PREGNANCY: PREG TEST UR: NEGATIVE

## 2015-04-21 MED ORDER — PROMETHAZINE HCL 25 MG PO TABS: 25.0000 mg | ORAL_TABLET | Freq: Four times a day (QID) | ORAL | Status: DC | PRN

## 2015-04-21 MED ORDER — MEDROXYPROGESTERONE ACETATE 150 MG/ML IM SUSP: 150.0000 mg | INTRAMUSCULAR | Status: DC

## 2015-04-21 NOTE — Progress Notes (Signed)
Subjective:     Patient ID: Lori Johnston, female   DOB: [birth year], 25 y.o.   MRN: [id number]  HPI Lori Johnston is a 25 year old black female in complaining of having some bleeding from February 11 to April 16, and it just stopped, no pain, but has had some nausea in the am and has lost weight but could be stress related.She has nexplanon but wants to have it removed and go back on depo.   Review of Systems Patient denies any headaches, hearing loss, fatigue, blurred vision, shortness of breath, chest pain, abdominal pain, problems with bowel movements, urination, or intercourse. No joint pain or mood swings.See HPI for positives. Reviewed past medical,surgical, social and family history. Reviewed medications and allergies.     Objective:   Physical Exam BP 100/60 mmHg  Pulse 76  Ht 5\' 9"  (1.753 m)  Wt 128 lb (58.06 kg)  BMI 18.89 kg/m2 UPT negative, Skin warm and dry.Pelvic: external genitalia is normal in appearance no lesions, vagina: tan discharge without odor,urethra has no lesions or masses noted, cervix:smooth and bulbous, uterus: normal size, shape and contour, non tender, no masses felt, adnexa: no masses or tenderness noted. Bladder is non tender and no masses felt.  GC/CHL obtained.  Will rx depo and get first injection before removing nexplanon.Use condoms and try to eat small frequent amounts. Face time 15 minutes with 50 % counseling.    Assessment:     Irregular period Nausea Nexplanon in place Weight loss  Contraceptive management     Plan:     GC/CHL sent Rx Depo provera 150 mg, disp.# 1 vial for IM injection every 3 months in office, with 4 refills Return in 1 week for depo and 2 weeks for nexplanon removal Rx phenergan 25 mg #30 take 1 every 6 hours prn with 1 refill

## 2015-04-21 NOTE — Patient Instructions (Signed)
Get depo and return in 1 week for depo  and 2 weeks for nexplanon removal

## 2015-04-23 LAB — GC/CHLAMYDIA PROBE AMP
Chlamydia trachomatis, NAA: NEGATIVE
NEISSERIA GONORRHOEAE BY PCR: NEGATIVE

## 2015-04-24 ENCOUNTER — Emergency Department (HOSPITAL_COMMUNITY)
Admission: EM | Admit: 2015-04-24 | Discharge: 2015-04-24 | Disposition: A | Discharge status: Home / Self Care | Payer: Medicaid Other | Attending: Emergency Medicine | Admitting: Emergency Medicine

## 2015-04-24 ENCOUNTER — Encounter (HOSPITAL_COMMUNITY): Payer: Self-pay | Admitting: Nurse Practitioner

## 2015-04-24 DIAGNOSIS — Z8619 Personal history of other infectious and parasitic diseases: Secondary | ICD-10-CM | POA: Insufficient documentation

## 2015-04-24 DIAGNOSIS — J020 Streptococcal pharyngitis: Secondary | ICD-10-CM

## 2015-04-24 DIAGNOSIS — Z87891 Personal history of nicotine dependence: Secondary | ICD-10-CM | POA: Diagnosis not present

## 2015-04-24 DIAGNOSIS — Z87440 Personal history of urinary (tract) infections: Secondary | ICD-10-CM | POA: Diagnosis not present

## 2015-04-24 DIAGNOSIS — Z8679 Personal history of other diseases of the circulatory system: Secondary | ICD-10-CM | POA: Insufficient documentation

## 2015-04-24 DIAGNOSIS — Z975 Presence of (intrauterine) contraceptive device: Secondary | ICD-10-CM | POA: Diagnosis not present

## 2015-04-24 DIAGNOSIS — R05 Cough: Secondary | ICD-10-CM | POA: Diagnosis present

## 2015-04-24 DIAGNOSIS — Z8742 Personal history of other diseases of the female genital tract: Secondary | ICD-10-CM | POA: Diagnosis not present

## 2015-04-24 LAB — RAPID STREP SCREEN (MED CTR MEBANE ONLY): Streptococcus, Group A Screen (Direct): POSITIVE — AB

## 2015-04-24 MED ORDER — IBUPROFEN 400 MG PO TABS
600.0000 mg | ORAL_TABLET | ORAL | Status: AC
  Administered 2015-04-24: 600 mg via ORAL
  Filled 2015-04-24: qty 1

## 2015-04-24 MED ORDER — PENICILLIN G BENZATHINE 1200000 UNIT/2ML IM SUSP
1.2000 10*6.[IU] | Freq: Once | INTRAMUSCULAR | Status: AC
  Administered 2015-04-24: 1.2 10*6.[IU] via INTRAMUSCULAR
  Filled 2015-04-24: qty 2

## 2015-04-24 NOTE — ED Notes (Signed)
She c/o 2 day history body aches, chills, sore throat, headache, malaise. She is A&Oxx4, breathing easily

## 2015-04-24 NOTE — Discharge Instructions (Signed)
Take tylenol and ibuprofen as needed for pain and fever. Rest, drink plenty of fluids. Return as needed for worsening symptoms.

## 2015-04-24 NOTE — ED Notes (Signed)
Called no answer

## 2015-04-24 NOTE — ED Provider Notes (Signed)
CSN: [id number]     Arrival date & time 04/24/15  1209 History   First MD Initiated Contact with Patient 04/24/15 1324     Chief Complaint  Patient presents with  . Influenza     (Consider location/radiation/quality/duration/timing/severity/associated sxs/prior Treatment) Patient is a 25 y.o. female presenting with URI. The history is provided by the patient.  URI Presenting symptoms: congestion, cough and sore throat   Presenting symptoms: no ear pain  Fever: ?   Severity:  Moderate Onset quality:  Gradual Duration:  2 days Timing:  Constant Progression:  Worsening Chronicity:  New Relieved by:  Nothing Worsened by:  Nothing tried Ineffective treatments:  None tried Associated symptoms: headaches and myalgias   Associated symptoms: no sinus pain and no wheezing   Risk factors: no recent travel and no sick contacts    Lori Johnston is a 25 y.o. female who presents to the ED with 2 day history of ? Fever, chills, sore throat, headache and just aching all over. She has taken nothing for her symptoms.     Past Medical History  Diagnosis Date  . Bronchitis   . Migraine   . History of chlamydia   . Vaginal discharge 12/10/2012  . BV (bacterial vaginosis) 12/10/2012  . Dyspareunia 12/10/2012  . PID (pelvic inflammatory disease)   . History of PID 07/16/2013  . RUQ pain 07/16/2013  . Yeast infection 07/16/2013  . Contraceptive education 07/16/2013  . Nexplanon insertion 08/27/2013  . UTI (lower urinary tract infection)   . Abnormal uterine bleeding (AUB) 05/28/2014  . Decreased appetite 05/28/2014  . Umbilical hernia [id number]  . Strain of rectus abdominis muscle 06/10/2014  . Weight loss 07/06/2014  . Nexplanon in place 07/06/2014  . Wheezing on expiration 12/09/2014  . Nausea 01/18/2015  . Irregular periods 01/18/2015  . Contraceptive management 04/21/2015   Past Surgical History  Procedure Laterality Date  . Wisdom tooth extraction    . Dilation and curettage of uterus N/A  03/18/2012    Procedure: DILATATION AND CURETTAGE;  Surgeon: Mora Bellman, MD;  Location: [facility];  Service: Gynecology;  Laterality: N/A;  . Tooth pulled    . Umbilical hernia repair N/A 08/18/2014    Procedure: UMBILICAL HERNIORRHAPHY;  Surgeon: [name], MD;  Location: [facility];  Service: General;  Laterality: N/A;  . Insertion of mesh N/A 08/18/2014    Procedure: INSERTION OF MESH;  Surgeon: [name], MD;  Location: [facility];  Service: General;  Laterality: N/A;   Family History  Problem Relation Age of Onset  . Anesthesia problems Neg Hx   . Hypotension Neg Hx   . Malignant hyperthermia Neg Hx   . Pseudochol deficiency Neg Hx   . Colon cancer Neg Hx   . Diabetes Cousin   . Hypertension Cousin   . Ulcers Cousin   . Heart attack Other     maternal great great grandma  . Deafness Paternal Grandmother    Social History  Substance Use Topics  . Smoking status: Former Smoker -- 0.20 packs/day for 2 years    Types: Cigarettes    Quit date: 08/12/2010  . Smokeless tobacco: Never Used  . Alcohol Use: 0.6 oz/week    1 Glasses of wine per week     Comment: rarely (holidays and family events)   OB History    Gravida Para Term Preterm AB TAB SAB Ectopic Multiple Living   2 2 1 1  0 0 0 0 0 2  Review of Systems  Constitutional: Positive for chills. Fever: ?  HENT: Positive for congestion and sore throat. Negative for ear pain.   Eyes: Negative for visual disturbance.  Respiratory: Positive for cough. Negative for shortness of breath and wheezing.   Gastrointestinal: Positive for nausea. Negative for abdominal pain and diarrhea.  Genitourinary: Negative for dysuria, frequency, hematuria and pelvic pain.  Musculoskeletal: Positive for myalgias.  Skin: Negative for rash.  Neurological: Positive for light-headedness and headaches. Negative for seizures and syncope.  Hematological: Positive for adenopathy.  Psychiatric/Behavioral: Negative for confusion. The patient is not  nervous/anxious.       Allergies  Bee venom and Other  Home Medications   Prior to Admission medications   Medication Sig Start Date End Date Taking? Authorizing Provider  etonogestrel (NEXPLANON) 68 MG IMPL implant Inject 1 each into the skin once.    Historical Provider, MD  medroxyPROGESTERone (DEPO-PROVERA) 150 MG/ML injection Inject 1 mL (150 mg total) into the muscle every 3 (three) months. 04/21/15   Estill Dooms, NP  promethazine (PHENERGAN) 25 MG tablet Take 1 tablet (25 mg total) by mouth every 6 (six) hours as needed for nausea or vomiting. 04/21/15   Estill Dooms, NP  UNABLE TO FIND Headache shot-prn    Historical Provider, MD   BP 113/61 mmHg  Pulse 73  Temp(Src) 98.4 F (36.9 C) (Oral)  Resp 16  SpO2 98% Physical Exam  Constitutional: She is oriented to person, place, and time. She appears well-developed and well-nourished. No distress.  HENT:  Head: Normocephalic.  Right Ear: Tympanic membrane normal.  Left Ear: Tympanic membrane normal.  Nose: Nose normal.  Mouth/Throat: Uvula is midline and mucous membranes are normal. Posterior oropharyngeal erythema present. No tonsillar abscesses.  Eyes: Conjunctivae and EOM are normal. Pupils are equal, round, and reactive to light.  Neck: Neck supple.  Cardiovascular: Normal rate and regular rhythm.   Pulmonary/Chest: Effort normal and breath sounds normal.  Abdominal: Soft. Bowel sounds are normal. There is no tenderness.  Musculoskeletal: Normal range of motion.  Lymphadenopathy:    She has cervical adenopathy.  Neurological: She is alert and oriented to person, place, and time. No cranial nerve deficit.  Skin: Skin is warm and dry.  Psychiatric: She has a normal mood and affect. Her behavior is normal.  Nursing note and vitals reviewed.   ED Course  Procedures (including critical care time) Labs Review Labs Reviewed  RAPID STREP SCREEN (NOT AT [facility]) - Abnormal; Notable for the following:     Streptococcus, Group A Screen (Direct) POSITIVE (*)    All other components within normal limits   Patient request injection rather than PO medication  CR Bicillin 1.2 M/U IM  MDM  25 y.o. female with sore throat, fever, chills and gland swelling x 2 days stable for d/c without fever and no peritonsillar abscess. She does not appear toxic. Patient will f/u with her PCP or return here as needed for worsening symptoms.   Final diagnoses:  Strep pharyngitis       Ashley Murrain, NP 04/25/15 0004  Fredia Sorrow, MD 04/25/15 [phone]

## 2015-04-28 ENCOUNTER — Ambulatory Visit (INDEPENDENT_AMBULATORY_CARE_PROVIDER_SITE_OTHER): Payer: Medicaid Other | Admitting: *Deleted

## 2015-04-28 ENCOUNTER — Telehealth: Payer: Self-pay | Admitting: Gastroenterology

## 2015-04-28 ENCOUNTER — Ambulatory Visit: Payer: Medicaid Other

## 2015-04-28 DIAGNOSIS — Z3042 Encounter for surveillance of injectable contraceptive: Secondary | ICD-10-CM | POA: Diagnosis not present

## 2015-04-28 DIAGNOSIS — Z3202 Encounter for pregnancy test, result negative: Secondary | ICD-10-CM

## 2015-04-28 LAB — POCT URINE PREGNANCY: PREG TEST UR: NEGATIVE

## 2015-04-28 MED ORDER — MEDROXYPROGESTERONE ACETATE 150 MG/ML IM SUSP
150.0000 mg | Freq: Once | INTRAMUSCULAR | Status: AC
  Administered 2015-04-28: 150 mg via INTRAMUSCULAR

## 2015-04-28 NOTE — Telephone Encounter (Signed)
Probably needs to see surgeon first. Can come to [place] if they feel a GI issue.

## 2015-04-28 NOTE — Progress Notes (Signed)
Patient ID: Lori Johnston, female   DOB: Sep 03, 1990, 25 y.o.   MRN: [id number] Depo Provera 150 mg IM given right deltoid with no complications, negative pregnancy test. Pt to return in 12 weeks for next injection. Pt states she has the Nexplanon and has an appt with Derrek Monaco, NP to have Nexplanon removed 05/09/2015.

## 2015-04-28 NOTE — Telephone Encounter (Signed)
Pt called today asking to make an OV with [place] due to pain around the site she had her hernia repair done. Does she need OV with [place] or should she go see surgeon who did her procedure (Dr Arnoldo Morale)? Please advise [phone]

## 2015-04-28 NOTE — Telephone Encounter (Signed)
Pt was last seen here in May of 2016. Her surgery was in August 2016. Vicente Males do you think pt needs to be seen here?

## 2015-04-29 NOTE — Telephone Encounter (Signed)
I called patient to tell her that she should call the surgeon first and if he thought it was a GI issue then we could make her an appointment. I gave her the number to Dr Arnoldo Morale office and she agreed.

## 2015-05-09 ENCOUNTER — Encounter: Payer: Medicaid Other | Admitting: Adult Health

## 2015-05-12 ENCOUNTER — Encounter: Payer: Medicaid Other | Admitting: Adult Health

## 2015-05-12 ENCOUNTER — Encounter: Payer: Self-pay | Admitting: Adult Health

## 2015-07-06 ENCOUNTER — Encounter: Payer: Self-pay | Admitting: Adult Health

## 2015-07-06 ENCOUNTER — Ambulatory Visit (INDEPENDENT_AMBULATORY_CARE_PROVIDER_SITE_OTHER): Payer: Medicaid Other | Admitting: Adult Health

## 2015-07-06 VITALS — BP 112/60 | HR 82 | Ht 68.0 in | Wt 136.5 lb

## 2015-07-06 DIAGNOSIS — R309 Painful micturition, unspecified: Secondary | ICD-10-CM | POA: Diagnosis not present

## 2015-07-06 DIAGNOSIS — N921 Excessive and frequent menstruation with irregular cycle: Secondary | ICD-10-CM | POA: Diagnosis not present

## 2015-07-06 DIAGNOSIS — Z975 Presence of (intrauterine) contraceptive device: Secondary | ICD-10-CM

## 2015-07-06 DIAGNOSIS — R319 Hematuria, unspecified: Secondary | ICD-10-CM

## 2015-07-06 HISTORY — DX: Excessive and frequent menstruation with irregular cycle: N92.1

## 2015-07-06 HISTORY — DX: Hematuria, unspecified: R31.9

## 2015-07-06 HISTORY — DX: Painful micturition, unspecified: R30.9

## 2015-07-06 LAB — POCT URINALYSIS DIPSTICK
Glucose, UA: NEGATIVE
LEUKOCYTES UA: NEGATIVE
NITRITE UA: NEGATIVE
Protein, UA: NEGATIVE

## 2015-07-06 NOTE — Progress Notes (Signed)
Subjective:     Patient ID: Lori Johnston, female   DOB: Jul 21, 1990, 25 y.o.   MRN: [id number]  HPI Lori Johnston is a 25 year old black female in complaining of pain with urination esp in am, and irregular bleeding this last period, depo due 7/20 and she is to get nexplanon removed 07/08/15. No abdominal pain.   Review of Systems + pain with urination +irregular bleeding,denies any abdominal pain  Reviewed past medical,surgical, social and family history. Reviewed medications and allergies.     Objective:   Physical Exam BP 112/60 mmHg  Pulse 82  Ht 5\' 8"  (1.727 m)  Wt 136 lb 8 oz (61.916 kg)  BMI 20.76 kg/m2  LMP 06/22/2015 urine 3+ blood, Skin warm and dry.Pelvic: external genitalia is normal in appearance no lesions, vagina: period like blood,urethra has no lesions or masses noted, cervix:smooth and bulbous, uterus: normal size, shape and contour, non tender, no masses felt, adnexa: no masses or tenderness noted. Bladder is non tender and no masses felt.  GC/CHL obtained.     Assessment:    Pain with urination Hematuria Irregular bleeding    Plan:    Push fluids GC/CHL sent UA C&S sent Return 7/7 for nexplanon removal  Depo due 7/20

## 2015-07-06 NOTE — Patient Instructions (Signed)
Push fluids  Keep appt 7/7 for nexplanon removal and depo 7/20

## 2015-07-07 LAB — MICROSCOPIC EXAMINATION: CASTS: NONE SEEN /LPF

## 2015-07-07 LAB — URINALYSIS, ROUTINE W REFLEX MICROSCOPIC
BILIRUBIN UA: NEGATIVE
GLUCOSE, UA: NEGATIVE
Ketones, UA: NEGATIVE
LEUKOCYTES UA: NEGATIVE
NITRITE UA: NEGATIVE
PROTEIN UA: NEGATIVE
Specific Gravity, UA: 1.02 (ref 1.005–1.030)
Urobilinogen, Ur: 1 mg/dL (ref 0.2–1.0)
pH, UA: 6.5 (ref 5.0–7.5)

## 2015-07-07 LAB — URINE CULTURE

## 2015-07-08 ENCOUNTER — Encounter: Payer: Medicaid Other | Admitting: Adult Health

## 2015-07-08 ENCOUNTER — Encounter: Payer: Self-pay | Admitting: Adult Health

## 2015-07-08 LAB — GC/CHLAMYDIA PROBE AMP
Chlamydia trachomatis, NAA: NEGATIVE
NEISSERIA GONORRHOEAE BY PCR: NEGATIVE

## 2015-07-18 ENCOUNTER — Ambulatory Visit: Payer: Medicaid Other | Admitting: Adult Health

## 2015-07-21 ENCOUNTER — Ambulatory Visit: Payer: Medicaid Other

## 2015-09-01 ENCOUNTER — Ambulatory Visit (INDEPENDENT_AMBULATORY_CARE_PROVIDER_SITE_OTHER): Payer: Medicaid Other | Admitting: *Deleted

## 2015-09-01 ENCOUNTER — Encounter: Payer: Self-pay | Admitting: *Deleted

## 2015-09-01 ENCOUNTER — Other Ambulatory Visit: Payer: Medicaid Other

## 2015-09-01 DIAGNOSIS — Z3202 Encounter for pregnancy test, result negative: Secondary | ICD-10-CM

## 2015-09-01 DIAGNOSIS — Z3042 Encounter for surveillance of injectable contraceptive: Secondary | ICD-10-CM

## 2015-09-01 LAB — POCT URINE PREGNANCY: Preg Test, Ur: NEGATIVE

## 2015-09-01 MED ORDER — MEDROXYPROGESTERONE ACETATE 150 MG/ML IM SUSP
150.0000 mg | Freq: Once | INTRAMUSCULAR | Status: AC
  Administered 2015-09-01: 150 mg via INTRAMUSCULAR

## 2015-09-01 NOTE — Progress Notes (Signed)
Pt here for Depo. Pt tolerated shot well. Return in 12 weeks for next shot. JSY 

## 2015-09-13 ENCOUNTER — Encounter: Payer: Self-pay | Admitting: Adult Health

## 2015-09-13 ENCOUNTER — Ambulatory Visit (INDEPENDENT_AMBULATORY_CARE_PROVIDER_SITE_OTHER): Payer: Medicaid Other | Admitting: Adult Health

## 2015-09-13 VITALS — BP 102/60 | HR 78 | Ht 68.0 in | Wt 133.5 lb

## 2015-09-13 DIAGNOSIS — Z3049 Encounter for surveillance of other contraceptives: Secondary | ICD-10-CM | POA: Diagnosis not present

## 2015-09-13 DIAGNOSIS — Z3046 Encounter for surveillance of implantable subdermal contraceptive: Secondary | ICD-10-CM

## 2015-09-13 NOTE — Patient Instructions (Addendum)
Use condoms x 2 weeks, keep clean and dry x 24 hours, no heavy lifting, keep steri strips on x 72 hours, Keep pressure dressing on x 24 hours. Follow up prn problems.return as scheduled for depo

## 2015-09-13 NOTE — Progress Notes (Signed)
Subjective:     Patient ID: Lori Johnston, female   DOB: 1990-12-09, 25 y.o.   MRN: [id number]  HPI Lori Johnston is a 25 year old black female in for nexplanon removal, she has had irregular bleeding and wants it out, has already started depo.  Review of Systems + irregular bleeding with nexplanon For nexplanon removal Reviewed past medical,surgical, social and family history. Reviewed medications and allergies.     Objective:   Physical Exam BP 102/60 (BP Location: Left Arm, Patient Position: Sitting, Cuff Size: Normal)   Pulse 78   Ht 5\' 8"  (1.727 m)   Wt 133 lb 8 oz (60.6 kg)   LMP 09/02/2015 (Exact Date)   BMI 20.30 kg/m consent signed, time out called,  Left arm cleansed with betadine, and injected with 1.5 cc 1% lidocaine and waited til numb.Under sterile technique a #11 blade was used to make small vertical incision, and a curved forceps was used to easily remove rod. Steri strips applied. Pressure dressing applied.    Assessment:     1. Encounter for Nexplanon removal       Plan:     Use condoms x 2 weeks, keep clean and dry x 24 hours, no heavy lifting, keep steri strips on x 72 hours, Keep pressure dressing on x 24 hours. Follow up prn problems.   Return as scheduled for depo Call with any bleeding that is prolonged

## 2015-09-29 ENCOUNTER — Ambulatory Visit (HOSPITAL_COMMUNITY)
Admission: EM | Admit: 2015-09-29 | Discharge: 2015-09-29 | Disposition: A | Discharge status: Home / Self Care | Payer: Medicaid Other | Attending: Emergency Medicine | Admitting: Emergency Medicine

## 2015-09-29 ENCOUNTER — Encounter (HOSPITAL_COMMUNITY): Payer: Self-pay | Admitting: Emergency Medicine

## 2015-09-29 DIAGNOSIS — K029 Dental caries, unspecified: Secondary | ICD-10-CM

## 2015-09-29 DIAGNOSIS — H6691 Otitis media, unspecified, right ear: Secondary | ICD-10-CM | POA: Diagnosis not present

## 2015-09-29 DIAGNOSIS — L509 Urticaria, unspecified: Secondary | ICD-10-CM | POA: Diagnosis not present

## 2015-09-29 DIAGNOSIS — R1084 Generalized abdominal pain: Secondary | ICD-10-CM

## 2015-09-29 MED ORDER — AMOXICILLIN-POT CLAVULANATE 875-125 MG PO TABS: 1.0000 | ORAL_TABLET | Freq: Two times a day (BID) | ORAL | 0 refills | Status: AC

## 2015-09-29 NOTE — ED Provider Notes (Signed)
CSN: [id number]     Arrival date & time 09/29/15  1649 History   First MD Initiated Contact with Patient 09/29/15 1825     Chief Complaint  Patient presents with  . Otalgia  . Headache  . Abdominal Pain  . Dental Pain   (Consider location/radiation/quality/duration/timing/severity/associated sxs/prior Treatment) 25 year old female presents with multiple concerns. She has been experiencing bilateral ear pain for the past 4 days. Denies any nasal congestion, cough or fever. She is having some sinus pressure and headaches. She also is having dental pain in right lower molar area and left upper molar area for the past week. She is missing fillings in those teeth. She is also having some abdominal discomfort, lower back pain and intermittent hives. Denies any urinary symptoms nausea, vomiting or diarrhea. She has taken [facility] powders with no relief.   The history is provided by the patient.    Past Medical History:  Diagnosis Date  . Abnormal uterine bleeding (AUB) 05/28/2014  . Bronchitis   . BV (bacterial vaginosis) 12/10/2012  . Contraceptive education 07/16/2013  . Contraceptive management 04/21/2015  . Decreased appetite 05/28/2014  . Dyspareunia 12/10/2012  . Hematuria 07/06/2015  . History of chlamydia   . History of PID 07/16/2013  . Irregular intermenstrual bleeding 07/06/2015  . Irregular periods 01/18/2015  . Migraine   . Nausea 01/18/2015  . Nexplanon in place 07/06/2014  . Nexplanon insertion 08/27/2013  . Pain with urination 07/06/2015  . PID (pelvic inflammatory disease)   . RUQ pain 07/16/2013  . Strain of rectus abdominis muscle 06/10/2014  . Umbilical hernia Q000111Q  . UTI (lower urinary tract infection)   . Vaginal discharge 12/10/2012  . Weight loss 07/06/2014  . Wheezing on expiration 12/09/2014  . Yeast infection 07/16/2013   Past Surgical History:  Procedure Laterality Date  . DILATION AND CURETTAGE OF UTERUS N/A 03/18/2012   Procedure: DILATATION AND CURETTAGE;  Surgeon:  Mora Bellman, MD;  Location: [facility];  Service: Gynecology;  Laterality: N/A;  . INSERTION OF MESH N/A 08/18/2014   Procedure: INSERTION OF MESH;  Surgeon: [name], MD;  Location: [facility];  Service: General;  Laterality: N/A;  . tooth pulled    . UMBILICAL HERNIA REPAIR N/A 08/18/2014   Procedure: UMBILICAL HERNIORRHAPHY;  Surgeon: [name], MD;  Location: [facility];  Service: General;  Laterality: N/A;  . WISDOM TOOTH EXTRACTION     Family History  Problem Relation Age of Onset  . Diabetes Cousin   . Hypertension Cousin   . Ulcers Cousin   . Deafness Paternal Grandmother   . Heart attack Other     maternal great great grandma  . Anesthesia problems Neg Hx   . Hypotension Neg Hx   . Malignant hyperthermia Neg Hx   . Pseudochol deficiency Neg Hx   . Colon cancer Neg Hx    Social History  Substance Use Topics  . Smoking status: Former Smoker    Packs/day: 0.20    Years: 2.00    Types: Cigarettes    Quit date: 08/12/2010  . Smokeless tobacco: Never Used  . Alcohol use 0.6 oz/week    1 Glasses of wine per week     Comment: rarely (holidays and family events)   OB History    Gravida Para Term Preterm AB Living   2 2 1 1  0 2   SAB TAB Ectopic Multiple Live Births   0 0 0 0 2     Review of Systems  Constitutional: Positive for fatigue. Negative for appetite change, chills, fever and unexpected weight change.  HENT: Positive for dental problem, ear pain and sinus pressure. Negative for congestion, rhinorrhea, sneezing and sore throat.   Respiratory: Negative for cough and shortness of breath.   Cardiovascular: Negative for chest pain.  Gastrointestinal: Positive for abdominal pain. Negative for diarrhea, nausea and vomiting.  Genitourinary: Negative for difficulty urinating and dysuria.  Musculoskeletal: Negative for arthralgias and myalgias.  Skin: Positive for rash.  Neurological: Positive for headaches. Negative for dizziness, weakness, light-headedness and numbness.      Allergies  Bee venom and Other  Home Medications   Prior to Admission medications   Medication Sig Start Date End Date Taking? Authorizing Provider  amoxicillin-clavulanate (AUGMENTIN) 875-125 MG tablet Take 1 tablet by mouth every 12 (twelve) hours. 09/29/15 10/09/15  Katy Apo, NP  medroxyPROGESTERone (DEPO-PROVERA) 150 MG/ML injection Inject 1 mL (150 mg total) into the muscle every 3 (three) months. 04/21/15   Estill Dooms, NP  UNABLE TO FIND [place] Headache relief shot 1000mg -prn    Historical Provider, MD   Meds Ordered and Administered this Visit  Medications - No data to display  BP 102/60 (BP Location: Left Arm)   Pulse 78   Temp 98.7 F (37.1 C) (Oral)   Resp 12   LMP 09/02/2015 (Exact Date)   SpO2 100%  No data found.   Physical Exam  Constitutional: She is oriented to person, place, and time. She appears well-developed. She appears cachectic. No distress.  HENT:  Head: Normocephalic and atraumatic.  Right Ear: Hearing, external ear and ear canal normal. Tympanic membrane is erythematous and bulging.  Left Ear: Hearing, external ear and ear canal normal. Tympanic membrane is bulging.  Nose: Right sinus exhibits frontal sinus tenderness. Right sinus exhibits no maxillary sinus tenderness. Left sinus exhibits frontal sinus tenderness. Left sinus exhibits no maxillary sinus tenderness.  Mouth/Throat: Uvula is midline, oropharynx is clear and moist and mucous membranes are normal. Abnormal dentition. Dental caries present.    Right lower molar- part of filling and tooth missing. Minimal redness of gum. No discharge. Tender around area.  Left upper molar- dental caries present with minimal gum irritation. No discharge. Also tender around area. No other distinct caries seen.   Neck: Normal range of motion. Neck supple.  Cardiovascular: Normal rate, regular rhythm and normal heart sounds.   Pulmonary/Chest: Effort normal and breath sounds normal.  Abdominal:  Soft. Normal appearance and bowel sounds are normal. There is no hepatosplenomegaly. There is generalized tenderness. There is no rigidity, no rebound, no guarding and no CVA tenderness.  Lymphadenopathy:    She has no cervical adenopathy.  Neurological: She is alert and oriented to person, place, and time.  Skin: Skin is warm and dry. Capillary refill takes less than 2 seconds. Rash noted. Rash is urticarial.     Random small papule to urticarial lesions present on arms, abdomen, legs, neck, face. Lesions will appear and then disappear within hours. Minimal itching.   Psychiatric: She has a normal mood and affect. Her speech is normal and behavior is normal. Judgment and thought content normal.    Urgent Care Course   Clinical Course    Procedures (including critical care time)  Labs Review Labs Reviewed - No data to display  Imaging Review No results found.   Visual Acuity Review  Right Eye Distance:   Left Eye Distance:   Bilateral Distance:    Right Eye Near:  Left Eye Near:    Bilateral Near:         MDM   1. Acute right otitis media, recurrence not specified, unspecified otitis media type   2. Dental caries   3. Generalized abdominal pain   4. Hives    Recommend start Augmentin 875mg  twice a day for 10 days. Discussed that patient needs to see dentist for further evaluation and continued care. Reviewed that rash appears to be hives- uncertain of cause but may be due to recent change in body soap. Encouraged to use previous soap. Discussed that abdominal pain and back pain will need to be addressed by her PCP- may need additional testing. Recommend follow-up with her PCP if symptoms do not improve within 5 days.     Katy Apo, NP 09/30/15 [phone]

## 2015-09-29 NOTE — ED Triage Notes (Signed)
Pt c/o bilateral ear pain onset x4 days associated w/abd pain, HA and back pain  Also reports dental pain x6 days  A&O x4... NAD

## 2015-09-29 NOTE — Discharge Instructions (Signed)
Start Augmentin twice a day as directed. May take Ibuprofen 600mg  every 8 hours as needed for pain. Recommend see dentist for further evaluation and treatment of dental issues. Recommend follow-up with a primary care provider in 5 days if not improving.

## 2015-11-29 ENCOUNTER — Ambulatory Visit: Payer: Medicaid Other

## 2015-12-01 ENCOUNTER — Ambulatory Visit: Payer: Medicaid Other

## 2015-12-29 ENCOUNTER — Encounter (HOSPITAL_COMMUNITY): Payer: Self-pay | Admitting: Family Medicine

## 2015-12-29 ENCOUNTER — Ambulatory Visit (HOSPITAL_COMMUNITY)
Admission: EM | Admit: 2015-12-29 | Discharge: 2015-12-29 | Disposition: A | Discharge status: Home / Self Care | Payer: Medicaid Other | Attending: Family Medicine | Admitting: Family Medicine

## 2015-12-29 DIAGNOSIS — J00 Acute nasopharyngitis [common cold]: Secondary | ICD-10-CM | POA: Diagnosis not present

## 2015-12-29 MED ORDER — IPRATROPIUM BROMIDE 0.06 % NA SOLN: 2.0000 | Freq: Four times a day (QID) | NASAL | 0 refills | Status: DC

## 2015-12-29 MED ORDER — AZITHROMYCIN 250 MG PO TABS
250.0000 mg | ORAL_TABLET | Freq: Every day | ORAL | 0 refills | Status: DC
Start: 2015-12-29 — End: 2016-01-17

## 2015-12-29 NOTE — ED Triage Notes (Signed)
Pt here for 10 days of nasal congestion and drainage. sts pain in facial area and teeth.

## 2015-12-29 NOTE — ED Provider Notes (Signed)
CSN: [id number]     Arrival date & time 12/29/15  1138 History   First MD Initiated Contact with Patient 12/29/15 1212     Chief Complaint  Patient presents with  . Nasal Congestion   (Consider location/radiation/quality/duration/timing/severity/associated sxs/prior Treatment) Patient c/o uri sx's with facial pressure and sinus discharge.      URI  Presenting symptoms: congestion, fatigue and rhinorrhea   Severity:  Moderate Onset quality:  Sudden Duration:  10 days Timing:  Constant Chronicity:  New Relieved by:  Rest Worsened by:  Nothing Ineffective treatments:  None tried   Past Medical History:  Diagnosis Date  . Abnormal uterine bleeding (AUB) 05/28/2014  . Bronchitis   . BV (bacterial vaginosis) 12/10/2012  . Contraceptive education 07/16/2013  . Contraceptive management 04/21/2015  . Decreased appetite 05/28/2014  . Dyspareunia 12/10/2012  . Hematuria 07/06/2015  . History of chlamydia   . History of PID 07/16/2013  . Irregular intermenstrual bleeding 07/06/2015  . Irregular periods 01/18/2015  . Migraine   . Nausea 01/18/2015  . Nexplanon in place 07/06/2014  . Nexplanon insertion 08/27/2013  . Pain with urination 07/06/2015  . PID (pelvic inflammatory disease)   . RUQ pain 07/16/2013  . Strain of rectus abdominis muscle 06/10/2014  . Umbilical hernia [id number]  . UTI (lower urinary tract infection)   . Vaginal discharge 12/10/2012  . Weight loss 07/06/2014  . Wheezing on expiration 12/09/2014  . Yeast infection 07/16/2013   Past Surgical History:  Procedure Laterality Date  . DILATION AND CURETTAGE OF UTERUS N/A 03/18/2012   Procedure: DILATATION AND CURETTAGE;  Surgeon: Mora Bellman, MD;  Location: [facility];  Service: Gynecology;  Laterality: N/A;  . INSERTION OF MESH N/A 08/18/2014   Procedure: INSERTION OF MESH;  Surgeon: [name], MD;  Location: [facility];  Service: General;  Laterality: N/A;  . tooth pulled    . UMBILICAL HERNIA REPAIR N/A 08/18/2014   Procedure:  UMBILICAL HERNIORRHAPHY;  Surgeon: [name], MD;  Location: [facility];  Service: General;  Laterality: N/A;  . WISDOM TOOTH EXTRACTION     Family History  Problem Relation Age of Onset  . Diabetes Cousin   . Hypertension Cousin   . Ulcers Cousin   . Deafness Paternal Grandmother   . Heart attack Other     maternal great great grandma  . Anesthesia problems Neg Hx   . Hypotension Neg Hx   . Malignant hyperthermia Neg Hx   . Pseudochol deficiency Neg Hx   . Colon cancer Neg Hx    Social History  Substance Use Topics  . Smoking status: Former Smoker    Packs/day: 0.20    Years: 2.00    Types: Cigarettes    Quit date: 08/12/2010  . Smokeless tobacco: Never Used  . Alcohol use 0.6 oz/week    1 Glasses of wine per week     Comment: rarely (holidays and family events)   OB History    Gravida Para Term Preterm AB Living   2 2 1 1  0 2   SAB TAB Ectopic Multiple Live Births   0 0 0 0 2     Review of Systems  Constitutional: Positive for fatigue.  HENT: Positive for congestion and rhinorrhea.   Eyes: Negative.   Respiratory: Negative.   Cardiovascular: Negative.   Gastrointestinal: Negative.   Endocrine: Negative.   Genitourinary: Negative.   Musculoskeletal: Negative.   Allergic/Immunologic: Negative.   Neurological: Negative.   Hematological: Negative.   Psychiatric/Behavioral: Negative.  Allergies  Bee venom and Other  Home Medications   Prior to Admission medications   Medication Sig Start Date End Date Taking? Authorizing Provider  azithromycin (ZITHROMAX) 250 MG tablet Take 1 tablet (250 mg total) by mouth daily. Take first 2 tablets together, then 1 every day until finished. 12/29/15   Lysbeth Penner, FNP  ipratropium (ATROVENT) 0.06 % nasal spray Place 2 sprays into both nostrils 4 (four) times daily. 12/29/15   Lysbeth Penner, FNP  medroxyPROGESTERone (DEPO-PROVERA) 150 MG/ML injection Inject 1 mL (150 mg total) into the muscle every 3 (three) months.  04/21/15   Estill Dooms, NP  UNABLE TO FIND [facility]y's Headache relief shot 1000mg -prn    Historical Provider, MD   Meds Ordered and Administered this Visit  Medications - No data to display  BP 117/74   Pulse 76   Temp 98.6 F (37 C)   Resp 18   LMP 12/29/2015   SpO2 100%  No data found.   Physical Exam  Constitutional: She appears well-developed and well-nourished.  HENT:  Head: Normocephalic and atraumatic.  Right Ear: External ear normal.  Left Ear: External ear normal.  Mouth/Throat: Oropharynx is clear and moist.  Eyes: Conjunctivae and EOM are normal. Pupils are equal, round, and reactive to light.  Neck: Normal range of motion. Neck supple.  Cardiovascular: Normal rate, regular rhythm and normal heart sounds.   Pulmonary/Chest: Effort normal and breath sounds normal.  Abdominal: Soft. Bowel sounds are normal.  Nursing note and vitals reviewed.   Urgent Care Course   Clinical Course     Procedures (including critical care time)  Labs Review Labs Reviewed - No data to display  Imaging Review No results found.   Visual Acuity Review  Right Eye Distance:   Left Eye Distance:   Bilateral Distance:    Right Eye Near:   Left Eye Near:    Bilateral Near:         MDM   1. Acute nasopharyngitis    Zpak  Atrovent Nasal Spray  Push po fluids, rest, tylenol and motrin otc prn as directed for fever, arthralgias, and myalgias.  Follow up prn if sx's continue or persist.    Lysbeth Penner, FNP 12/29/15 1248

## 2016-01-17 ENCOUNTER — Encounter (HOSPITAL_COMMUNITY): Payer: Self-pay

## 2016-01-17 ENCOUNTER — Emergency Department (HOSPITAL_COMMUNITY)
Admission: EM | Admit: 2016-01-17 | Discharge: 2016-01-17 | Disposition: A | Discharge status: Home / Self Care | Payer: Medicaid Other | Attending: Emergency Medicine | Admitting: Emergency Medicine

## 2016-01-17 ENCOUNTER — Emergency Department (HOSPITAL_COMMUNITY): Payer: Medicaid Other

## 2016-01-17 DIAGNOSIS — R509 Fever, unspecified: Secondary | ICD-10-CM | POA: Diagnosis present

## 2016-01-17 DIAGNOSIS — J069 Acute upper respiratory infection, unspecified: Secondary | ICD-10-CM | POA: Diagnosis not present

## 2016-01-17 DIAGNOSIS — Z87891 Personal history of nicotine dependence: Secondary | ICD-10-CM | POA: Insufficient documentation

## 2016-01-17 LAB — COMPREHENSIVE METABOLIC PANEL
ALBUMIN: 3.8 g/dL (ref 3.5–5.0)
ALK PHOS: 35 U/L — AB (ref 38–126)
ALT: 13 U/L — ABNORMAL LOW (ref 14–54)
AST: 25 U/L (ref 15–41)
Anion gap: 11 (ref 5–15)
BILIRUBIN TOTAL: 0.6 mg/dL (ref 0.3–1.2)
BUN: 6 mg/dL (ref 6–20)
CALCIUM: 8.9 mg/dL (ref 8.9–10.3)
CO2: 24 mmol/L (ref 22–32)
Chloride: 100 mmol/L — ABNORMAL LOW (ref 101–111)
Creatinine, Ser: 0.82 mg/dL (ref 0.44–1.00)
GFR calc Af Amer: >60 mL/min (ref >60)
GFR calc non Af Amer: >60 mL/min (ref >60)
GLUCOSE: 88 mg/dL (ref 65–99)
POTASSIUM: 3.8 mmol/L (ref 3.5–5.1)
SODIUM: 135 mmol/L (ref 135–145)
TOTAL PROTEIN: 6.4 g/dL — AB (ref 6.5–8.1)

## 2016-01-17 LAB — CBC WITH DIFFERENTIAL/PLATELET
BASOS ABS: 0 10*3/uL (ref 0.0–0.1)
Basophils Relative: 0 %
EOS PCT: 0 %
Eosinophils Absolute: 0 10*3/uL (ref 0.0–0.7)
HEMATOCRIT: 37.4 % (ref 36.0–46.0)
Hemoglobin: 12.2 g/dL (ref 12.0–15.0)
LYMPHS PCT: 19 %
Lymphs Abs: 1 10*3/uL (ref 0.7–4.0)
MCH: 27.6 pg (ref 26.0–34.0)
MCHC: 32.6 g/dL (ref 30.0–36.0)
MCV: 84.6 fL (ref 78.0–100.0)
MONO ABS: 0.6 10*3/uL (ref 0.1–1.0)
MONOS PCT: 11 %
NEUTROS ABS: 3.9 10*3/uL (ref 1.7–7.7)
Neutrophils Relative %: 70 %
PLATELETS: 138 10*3/uL — AB (ref 150–400)
RBC: 4.42 MIL/uL (ref 3.87–5.11)
RDW: 14.5 % (ref 11.5–15.5)
WBC: 5.5 10*3/uL (ref 4.0–10.5)

## 2016-01-17 LAB — URINALYSIS, ROUTINE W REFLEX MICROSCOPIC
Bilirubin Urine: NEGATIVE
Glucose, UA: NEGATIVE mg/dL
Hgb urine dipstick: NEGATIVE
Ketones, ur: 20 mg/dL — AB
LEUKOCYTES UA: NEGATIVE
Nitrite: NEGATIVE
PROTEIN: 30 mg/dL — AB
SPECIFIC GRAVITY, URINE: 1.031 — AB (ref 1.005–1.030)
pH: 5 (ref 5.0–8.0)

## 2016-01-17 LAB — I-STAT CG4 LACTIC ACID, ED: Lactic Acid, Venous: 1.02 mmol/L (ref 0.5–1.9)

## 2016-01-17 MED ORDER — IBUPROFEN 400 MG PO TABS
600.0000 mg | ORAL_TABLET | Freq: Once | ORAL | Status: AC
  Administered 2016-01-17: 600 mg via ORAL
  Filled 2016-01-17: qty 1
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
## 2016-01-17 NOTE — ED Triage Notes (Signed)
Per Pt, Pt is coming from home with complaints of cough, congestion, fevers, chills, body aches, and headache x2 weeks. Pt reports no relief after going to the UC and being prescribed a Z-pack

## 2016-04-11 ENCOUNTER — Encounter: Payer: Self-pay | Admitting: Adult Health

## 2016-04-11 ENCOUNTER — Ambulatory Visit (INDEPENDENT_AMBULATORY_CARE_PROVIDER_SITE_OTHER): Payer: Medicaid Other | Admitting: Adult Health

## 2016-04-11 VITALS — BP 100/60 | HR 80 | Ht 67.4 in | Wt 133.4 lb

## 2016-04-11 DIAGNOSIS — Z3490 Encounter for supervision of normal pregnancy, unspecified, unspecified trimester: Secondary | ICD-10-CM

## 2016-04-11 DIAGNOSIS — Z3201 Encounter for pregnancy test, result positive: Secondary | ICD-10-CM

## 2016-04-11 DIAGNOSIS — N912 Amenorrhea, unspecified: Secondary | ICD-10-CM | POA: Diagnosis not present

## 2016-04-11 DIAGNOSIS — O3680X0 Pregnancy with inconclusive fetal viability, not applicable or unspecified: Secondary | ICD-10-CM

## 2016-04-11 LAB — POCT URINE PREGNANCY: Preg Test, Ur: POSITIVE — AB

## 2016-04-11 NOTE — Patient Instructions (Signed)
First Trimester of Pregnancy The first trimester of pregnancy is from week 1 until the end of week 13 (months 1 through 3). A week after a sperm fertilizes an egg, the egg will implant on the wall of the uterus. This embryo will begin to develop into a baby. Genes from you and your partner will form the baby. The female genes will determine whether the baby will be a boy or a girl. At 6-8 weeks, the eyes and face will be formed, and the heartbeat can be seen on ultrasound. At the end of 12 weeks, all the baby's organs will be formed. Now that you are pregnant, you will want to do everything you can to have a healthy baby. Two of the most important things are to get good prenatal care and to follow your health care provider's instructions. Prenatal care is all the medical care you receive before the baby's birth. This care will help prevent, find, and treat any problems during the pregnancy and childbirth. Body changes during your first trimester Your body goes through many changes during pregnancy. The changes vary from woman to woman.  You may gain or lose a couple of pounds at first.  You may feel sick to your stomach (nauseous) and you may throw up (vomit). If the vomiting is uncontrollable, call your health care provider.  You may tire easily.  You may develop headaches that can be relieved by medicines. All medicines should be approved by your health care provider.  You may urinate more often. Painful urination may mean you have a bladder infection.  You may develop heartburn as a result of your pregnancy.  You may develop constipation because certain hormones are causing the muscles that push stool through your intestines to slow down.  You may develop hemorrhoids or swollen veins (varicose veins).  Your breasts may begin to grow larger and become tender. Your nipples may stick out more, and the tissue that surrounds them (areola) may become darker.  Your gums may bleed and may be  sensitive to brushing and flossing.  Dark spots or blotches (chloasma, mask of pregnancy) may develop on your face. This will likely fade after the baby is born.  Your menstrual periods will stop.  You may have a loss of appetite.  You may develop cravings for certain kinds of food.  You may have changes in your emotions from day to day, such as being excited to be pregnant or being concerned that something may go wrong with the pregnancy and baby.  You may have more vivid and strange dreams.  You may have changes in your hair. These can include thickening of your hair, rapid growth, and changes in texture. Some women also have hair loss during or after pregnancy, or hair that feels dry or thin. Your hair will most likely return to normal after your baby is born.  What to expect at prenatal visits During a routine prenatal visit:  You will be weighed to make sure you and the baby are growing normally.  Your blood pressure will be taken.  Your abdomen will be measured to track your baby's growth.  The fetal heartbeat will be listened to between weeks 10 and 14 of your pregnancy.  Test results from any previous visits will be discussed.  Your health care provider may ask you:  How you are feeling.  If you are feeling the baby move.  If you have had any abnormal symptoms, such as leaking fluid, bleeding, severe headaches,   or abdominal cramping.  If you are using any tobacco products, including cigarettes, chewing tobacco, and electronic cigarettes.  If you have any questions.  Other tests that may be performed during your first trimester include:  Blood tests to find your blood type and to check for the presence of any previous infections. The tests will also be used to check for low iron levels (anemia) and protein on red blood cells (Rh antibodies). Depending on your risk factors, or if you previously had diabetes during pregnancy, you may have tests to check for high blood  sugar that affects pregnant women (gestational diabetes).  Urine tests to check for infections, diabetes, or protein in the urine.  An ultrasound to confirm the proper growth and development of the baby.  Fetal screens for spinal cord problems (spina bifida) and Down syndrome.  HIV (human immunodeficiency virus) testing. Routine prenatal testing includes screening for HIV, unless you choose not to have this test.  You may need other tests to make sure you and the baby are doing well.  Follow these instructions at home: Medicines  Follow your health care provider's instructions regarding medicine use. Specific medicines may be either safe or unsafe to take during pregnancy.  Take a prenatal vitamin that contains at least 600 micrograms (mcg) of folic acid.  If you develop constipation, try taking a stool softener if your health care provider approves. Eating and drinking  Eat a balanced diet that includes fresh fruits and vegetables, whole grains, good sources of protein such as meat, eggs, or tofu, and low-fat dairy. Your health care provider will help you determine the amount of weight gain that is right for you.  Avoid raw meat and uncooked cheese. These carry germs that can cause birth defects in the baby.  Eating four or five small meals rather than three large meals a day may help relieve nausea and vomiting. If you start to feel nauseous, eating a few soda crackers can be helpful. Drinking liquids between meals, instead of during meals, also seems to help ease nausea and vomiting.  Limit foods that are high in fat and processed sugars, such as fried and sweet foods.  To prevent constipation: ? Eat foods that are high in fiber, such as fresh fruits and vegetables, whole grains, and beans. ? Drink enough fluid to keep your urine clear or pale yellow. Activity  Exercise only as directed by your health care provider. Most women can continue their usual exercise routine during  pregnancy. Try to exercise for 30 minutes at least 5 days a week. Exercising will help you: ? Control your weight. ? Stay in shape. ? Be prepared for labor and delivery.  Experiencing pain or cramping in the lower abdomen or lower back is a good sign that you should stop exercising. Check with your health care provider before continuing with normal exercises.  Try to avoid standing for long periods of time. Move your legs often if you must stand in one place for a long time.  Avoid heavy lifting.  Wear low-heeled shoes and practice good posture.  You may continue to have sex unless your health care provider tells you not to. Relieving pain and discomfort  Wear a good support bra to relieve breast tenderness.  Take warm sitz baths to soothe any pain or discomfort caused by hemorrhoids. Use hemorrhoid cream if your health care provider approves.  Rest with your legs elevated if you have leg cramps or low back pain.  If you develop   varicose veins in your legs, wear support hose. Elevate your feet for 15 minutes, 3-4 times a day. Limit salt in your diet. Prenatal care  Schedule your prenatal visits by the twelfth week of pregnancy. They are usually scheduled monthly at first, then more often in the last 2 months before delivery.  Write down your questions. Take them to your prenatal visits.  Keep all your prenatal visits as told by your health care provider. This is important. Safety  Wear your seat belt at all times when driving.  Make a list of emergency phone numbers, including numbers for family, friends, the hospital, and police and fire departments. General instructions  Ask your health care provider for a referral to a local prenatal education class. Begin classes no later than the beginning of month 6 of your pregnancy.  Ask for help if you have counseling or nutritional needs during pregnancy. Your health care provider can offer advice or refer you to specialists for help  with various needs.  Do not use hot tubs, steam rooms, or saunas.  Do not douche or use tampons or scented sanitary pads.  Do not cross your legs for long periods of time.  Avoid cat litter boxes and soil used by cats. These carry germs that can cause birth defects in the baby and possibly loss of the fetus by miscarriage or stillbirth.  Avoid all smoking, herbs, alcohol, and medicines not prescribed by your health care provider. Chemicals in these products affect the formation and growth of the baby.  Do not use any products that contain nicotine or tobacco, such as cigarettes and e-cigarettes. If you need help quitting, ask your health care provider. You may receive counseling support and other resources to help you quit.  Schedule a dentist appointment. At home, brush your teeth with a soft toothbrush and be gentle when you floss. Contact a health care provider if:  You have dizziness.  You have mild pelvic cramps, pelvic pressure, or nagging pain in the abdominal area.  You have persistent nausea, vomiting, or diarrhea.  You have a bad smelling vaginal discharge.  You have pain when you urinate.  You notice increased swelling in your face, hands, legs, or ankles.  You are exposed to fifth disease or chickenpox.  You are exposed to German measles (rubella) and have never had it. Get help right away if:  You have a fever.  You are leaking fluid from your vagina.  You have spotting or bleeding from your vagina.  You have severe abdominal cramping or pain.  You have rapid weight gain or loss.  You vomit blood or material that looks like coffee grounds.  You develop a severe headache.  You have shortness of breath.  You have any kind of trauma, such as from a fall or a car accident. Summary  The first trimester of pregnancy is from week 1 until the end of week 13 (months 1 through 3).  Your body goes through many changes during pregnancy. The changes vary from  woman to woman.  You will have routine prenatal visits. During those visits, your health care provider will examine you, discuss any test results you may have, and talk with you about how you are feeling. This information is not intended to replace advice given to you by your health care provider. Make sure you discuss any questions you have with your health care provider. Document Released: 12/12/2000 Document Revised: 11/30/2015 Document Reviewed: 11/30/2015 Elsevier Interactive Patient Education  2017 [employer]   Inc.  

## 2016-04-11 NOTE — Progress Notes (Signed)
Subjective:     Patient ID: [name], female   DOB: 16-Apr-1990, 26 y.o.   MRN: [id number]  HPI Nekeisha is a 26 year old black female in for UPT, has missed a period and had 4+HPT.   Review of Systems +missed period with 4+HPT She denies any bleeding, nausea or vomiting  Reviewed past medical,surgical, social and family history. Reviewed medications and allergies.     Objective:   Physical Exam BP 100/60 (BP Location: Left Arm, Patient Position: Sitting, Cuff Size: Small)   Pulse 80   Ht 5' 7.4" (1.712 m)   Wt 133 lb 6.4 oz (60.5 kg)   LMP 03/12/2016   BMI 20.65 kg/m UPT +, about 4+2 weeks by LMP, with EDD 12/17/16. Skin warm and dry. Neck: mid line trachea, normal thyroid, good ROM, no lymphadenopathy noted. Lungs: clear to ausculation bilaterally. Cardiovascular: regular rate and rhythm. Abdomen is soft and non tender PHQ 2 score 0.    Assessment:     1. Pregnancy test positive   2. Pregnancy, unspecified gestational age   66. Encounter to determine fetal viability of pregnancy, single or unspecified fetus       Plan:     Check QHCG  Return in 3 weeks for dating [place] Review handout on first trimester

## 2016-04-12 LAB — BETA HCG QUANT (REF LAB): hCG Quant: 820 m[IU]/mL

## 2016-04-17 ENCOUNTER — Telehealth: Payer: Self-pay | Admitting: Women's Health

## 2016-04-17 NOTE — Telephone Encounter (Signed)
Patient states she woke up with some light spotting when she wipes this am. She has just recently had intercourse. Advised patient that spotting is very common in the early part of pregnancy and to not have sex for 7 days until the bleeding stops. Advised to let us know if she starts to have heavier bleeding.  Pt verbalized understanding

## 2016-04-23 ENCOUNTER — Telehealth: Payer: Self-pay | Admitting: *Deleted

## 2016-04-23 MED ORDER — PROMETHAZINE HCL 6.25 MG/5ML PO SYRP: 12.5000 mg | ORAL_SOLUTION | Freq: Four times a day (QID) | ORAL | 0 refills | Status: DC | PRN

## 2016-04-23 NOTE — Telephone Encounter (Signed)
Complains of nausea and vomiting, wants  Phenergan in liquid form and try peanut butter, if not better go to ER for IV fluids

## 2016-04-23 NOTE — Telephone Encounter (Signed)
Patient called with complaints of nausea. She is currently not taking anything. Please advise.

## 2016-05-01 ENCOUNTER — Emergency Department (HOSPITAL_COMMUNITY)
Admission: EM | Admit: 2016-05-01 | Discharge: 2016-05-01 | Disposition: A | Discharge status: Home / Self Care | Payer: Medicaid Other | Attending: Emergency Medicine | Admitting: Emergency Medicine

## 2016-05-01 ENCOUNTER — Encounter (HOSPITAL_COMMUNITY): Payer: Self-pay | Admitting: Emergency Medicine

## 2016-05-01 DIAGNOSIS — O2391 Unspecified genitourinary tract infection in pregnancy, first trimester: Secondary | ICD-10-CM | POA: Insufficient documentation

## 2016-05-01 DIAGNOSIS — Z3A01 Less than 8 weeks gestation of pregnancy: Secondary | ICD-10-CM | POA: Insufficient documentation

## 2016-05-01 DIAGNOSIS — O210 Mild hyperemesis gravidarum: Secondary | ICD-10-CM

## 2016-05-01 DIAGNOSIS — Z87891 Personal history of nicotine dependence: Secondary | ICD-10-CM | POA: Insufficient documentation

## 2016-05-01 DIAGNOSIS — O2341 Unspecified infection of urinary tract in pregnancy, first trimester: Secondary | ICD-10-CM

## 2016-05-01 LAB — URINALYSIS, ROUTINE W REFLEX MICROSCOPIC
Bilirubin Urine: NEGATIVE
Glucose, UA: NEGATIVE mg/dL
HGB URINE DIPSTICK: NEGATIVE
Ketones, ur: 20 mg/dL — AB
Nitrite: NEGATIVE
Protein, ur: 30 mg/dL — AB
SPECIFIC GRAVITY, URINE: 1.027 (ref 1.005–1.030)
pH: 5 (ref 5.0–8.0)

## 2016-05-01 LAB — COMPREHENSIVE METABOLIC PANEL
ALK PHOS: 40 U/L (ref 38–126)
ALT: 15 U/L (ref 14–54)
ANION GAP: 8 (ref 5–15)
AST: 21 U/L (ref 15–41)
Albumin: 4.2 g/dL (ref 3.5–5.0)
BILIRUBIN TOTAL: 0.5 mg/dL (ref 0.3–1.2)
BUN: 7 mg/dL (ref 6–20)
CALCIUM: 9.3 mg/dL (ref 8.9–10.3)
CO2: 25 mmol/L (ref 22–32)
Chloride: 103 mmol/L (ref 101–111)
Creatinine, Ser: 0.62 mg/dL (ref 0.44–1.00)
GFR calc non Af Amer: >60 mL/min (ref >60)
GLUCOSE: 89 mg/dL (ref 65–99)
Potassium: 4.2 mmol/L (ref 3.5–5.1)
Sodium: 136 mmol/L (ref 135–145)
TOTAL PROTEIN: 6.5 g/dL (ref 6.5–8.1)

## 2016-05-01 LAB — CBC
HCT: 37.2 % (ref 36.0–46.0)
HEMOGLOBIN: 12.2 g/dL (ref 12.0–15.0)
MCH: 27.9 pg (ref 26.0–34.0)
MCHC: 32.8 g/dL (ref 30.0–36.0)
MCV: 85.1 fL (ref 78.0–100.0)
PLATELETS: 192 10*3/uL (ref 150–400)
RBC: 4.37 MIL/uL (ref 3.87–5.11)
RDW: 13.9 % (ref 11.5–15.5)
WBC: 9.5 10*3/uL (ref 4.0–10.5)

## 2016-05-01 LAB — LIPASE, BLOOD: Lipase: 20 U/L (ref 11–51)

## 2016-05-01 LAB — WET PREP, GENITAL
SPERM: NONE SEEN
TRICH WET PREP: NONE SEEN
YEAST WET PREP: NONE SEEN

## 2016-05-01 LAB — I-STAT BETA HCG BLOOD, ED (MC, WL, AP ONLY)

## 2016-05-01 MED ORDER — SODIUM CHLORIDE 0.9 % IV BOLUS (SEPSIS)
1000.0000 mL | Freq: Once | INTRAVENOUS | Status: AC
  Administered 2016-05-01: 1000 mL via INTRAVENOUS

## 2016-05-01 MED ORDER — SODIUM CHLORIDE 0.9 % IV BOLUS (SEPSIS)
1000.0000 mL | Freq: Once | INTRAVENOUS | Status: AC
Start: 2016-05-01 — End: 2016-05-01
  Administered 2016-05-01: 1000 mL via INTRAVENOUS

## 2016-05-01 MED ORDER — CEPHALEXIN 500 MG PO CAPS: 500.0000 mg | ORAL_CAPSULE | Freq: Two times a day (BID) | ORAL | 0 refills | Status: DC

## 2016-05-01 MED ORDER — DEXTROSE 5 % IV SOLN
1.0000 g | Freq: Once | INTRAVENOUS | Status: AC
  Administered 2016-05-01: 1 g via INTRAVENOUS
  Filled 2016-05-01: qty 10

## 2016-05-01 MED ORDER — DOXYLAMINE-PYRIDOXINE 10-10 MG PO TBEC: DELAYED_RELEASE_TABLET | ORAL | 0 refills | Status: DC

## 2016-05-01 MED ORDER — ONDANSETRON HCL 4 MG/2ML IJ SOLN
4.0000 mg | Freq: Once | INTRAMUSCULAR | Status: AC
  Administered 2016-05-01: 4 mg via INTRAVENOUS
  Filled 2016-05-01: qty 2

## 2016-05-01 NOTE — Discharge Instructions (Signed)
Take Keflex as prescribed until all gone for urinary tract infection. Take diclegis for nausea vomiting. Follow-up with your OB/GYN. Continue to drink plenty of fluids.

## 2016-05-01 NOTE — ED Notes (Signed)
Provided ginger ale with permission from provider.
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
## 2016-05-01 NOTE — ED Notes (Signed)
Pelvic Cart at bedside 

## 2016-05-01 NOTE — ED Triage Notes (Signed)
Pt reports she has had n/v for the past month and has been unable to keep fluids down. Pt knows she is [redacted] weeks pregnant. Not on any antiemetics.

## 2016-05-02 ENCOUNTER — Ambulatory Visit (INDEPENDENT_AMBULATORY_CARE_PROVIDER_SITE_OTHER): Payer: Medicaid Other

## 2016-05-02 DIAGNOSIS — O3680X0 Pregnancy with inconclusive fetal viability, not applicable or unspecified: Secondary | ICD-10-CM

## 2016-05-02 LAB — GC/CHLAMYDIA PROBE AMP (~~LOC~~) NOT AT ARMC
CHLAMYDIA, DNA PROBE: NEGATIVE
Neisseria Gonorrhea: POSITIVE — AB

## 2016-05-02 NOTE — Progress Notes (Signed)
[place] 7+2 wks,single IUP w/ ys,pos fht 137 bpm,normal ovaries bilat,crl 11.1 mm,EDD 12/17/2016 BY LMP

## 2016-05-03 LAB — URINE CULTURE

## 2016-05-04 ENCOUNTER — Telehealth: Payer: Self-pay | Admitting: *Deleted

## 2016-05-04 ENCOUNTER — Encounter: Payer: Self-pay | Admitting: *Deleted

## 2016-05-04 DIAGNOSIS — Z8619 Personal history of other infectious and parasitic diseases: Secondary | ICD-10-CM | POA: Insufficient documentation

## 2016-05-04 DIAGNOSIS — O98211 Gonorrhea complicating pregnancy, first trimester: Secondary | ICD-10-CM

## 2016-05-04 HISTORY — DX: Gonorrhea complicating pregnancy, first trimester: O98.211
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
## 2016-05-08 ENCOUNTER — Encounter: Payer: Medicaid Other | Admitting: Advanced Practice Midwife

## 2016-05-08 ENCOUNTER — Ambulatory Visit: Payer: Medicaid Other | Admitting: *Deleted

## 2016-05-22 ENCOUNTER — Encounter: Payer: Medicaid Other | Admitting: Adult Health

## 2016-05-22 ENCOUNTER — Ambulatory Visit: Payer: Medicaid Other | Admitting: *Deleted

## 2016-06-05 ENCOUNTER — Ambulatory Visit (INDEPENDENT_AMBULATORY_CARE_PROVIDER_SITE_OTHER): Payer: Medicaid Other | Admitting: Advanced Practice Midwife

## 2016-06-05 ENCOUNTER — Encounter: Payer: Self-pay | Admitting: Advanced Practice Midwife

## 2016-06-05 ENCOUNTER — Ambulatory Visit: Payer: Medicaid Other | Admitting: *Deleted

## 2016-06-05 ENCOUNTER — Other Ambulatory Visit (HOSPITAL_COMMUNITY)
Admission: RE | Admit: 2016-06-05 | Discharge: 2016-06-05 | Disposition: A | Discharge status: Home / Self Care | Payer: Medicaid Other | Source: Ambulatory Visit | Attending: Advanced Practice Midwife | Admitting: Advanced Practice Midwife

## 2016-06-05 VITALS — BP 100/60 | HR 78 | Wt 133.0 lb

## 2016-06-05 DIAGNOSIS — Z1389 Encounter for screening for other disorder: Secondary | ICD-10-CM

## 2016-06-05 DIAGNOSIS — Z3682 Encounter for antenatal screening for nuchal translucency: Secondary | ICD-10-CM

## 2016-06-05 DIAGNOSIS — Z3481 Encounter for supervision of other normal pregnancy, first trimester: Secondary | ICD-10-CM

## 2016-06-05 DIAGNOSIS — Z124 Encounter for screening for malignant neoplasm of cervix: Secondary | ICD-10-CM

## 2016-06-05 DIAGNOSIS — Z331 Pregnant state, incidental: Secondary | ICD-10-CM

## 2016-06-05 DIAGNOSIS — Z3A12 12 weeks gestation of pregnancy: Secondary | ICD-10-CM

## 2016-06-05 DIAGNOSIS — Z3490 Encounter for supervision of normal pregnancy, unspecified, unspecified trimester: Secondary | ICD-10-CM | POA: Insufficient documentation

## 2016-06-05 LAB — POCT URINALYSIS DIPSTICK
Glucose, UA: NEGATIVE
Ketones, UA: NEGATIVE
Leukocytes, UA: NEGATIVE
NITRITE UA: NEGATIVE
PROTEIN UA: NEGATIVE
RBC UA: NEGATIVE

## 2016-06-05 NOTE — Patient Instructions (Signed)
 First Trimester of Pregnancy The first trimester of pregnancy is from week 1 until the end of week 12 (months 1 through 3). A week after a sperm fertilizes an egg, the egg will implant on the wall of the uterus. This embryo will begin to develop into a baby. Genes from you and your partner are forming the baby. The female genes determine whether the baby is a boy or a girl. At 6-8 weeks, the eyes and face are formed, and the heartbeat can be seen on ultrasound. At the end of 12 weeks, all the baby's organs are formed.  Now that you are pregnant, you will want to do everything you can to have a healthy baby. Two of the most important things are to get good prenatal care and to follow your health care provider's instructions. Prenatal care is all the medical care you receive before the baby's birth. This care will help prevent, find, and treat any problems during the pregnancy and childbirth. BODY CHANGES Your body goes through many changes during pregnancy. The changes vary from woman to woman.   You may gain or lose a couple of pounds at first.  You may feel sick to your stomach (nauseous) and throw up (vomit). If the vomiting is uncontrollable, call your health care provider.  You may tire easily.  You may develop headaches that can be relieved by medicines approved by your health care provider.  You may urinate more often. Painful urination may mean you have a bladder infection.  You may develop heartburn as a result of your pregnancy.  You may develop constipation because certain hormones are causing the muscles that push waste through your intestines to slow down.  You may develop hemorrhoids or swollen, bulging veins (varicose veins).  Your breasts may begin to grow larger and become tender. Your nipples may stick out more, and the tissue that surrounds them (areola) may become darker.  Your gums may bleed and may be sensitive to brushing and flossing.  Dark spots or blotches  (chloasma, mask of pregnancy) may develop on your face. This will likely fade after the baby is born.  Your menstrual periods will stop.  You may have a loss of appetite.  You may develop cravings for certain kinds of food.  You may have changes in your emotions from day to day, such as being excited to be pregnant or being concerned that something may go wrong with the pregnancy and baby.  You may have more vivid and strange dreams.  You may have changes in your hair. These can include thickening of your hair, rapid growth, and changes in texture. Some women also have hair loss during or after pregnancy, or hair that feels dry or thin. Your hair will most likely return to normal after your baby is born. WHAT TO EXPECT AT YOUR PRENATAL VISITS During a routine prenatal visit:  You will be weighed to make sure you and the baby are growing normally.  Your blood pressure will be taken.  Your abdomen will be measured to track your baby's growth.  The fetal heartbeat will be listened to starting around week 10 or 12 of your pregnancy.  Test results from any previous visits will be discussed. Your health care provider may ask you:  How you are feeling.  If you are feeling the baby move.  If you have had any abnormal symptoms, such as leaking fluid, bleeding, severe headaches, or abdominal cramping.  If you have any questions. Other   tests that may be performed during your first trimester include:  Blood tests to find your blood type and to check for the presence of any previous infections. They will also be used to check for low iron levels (anemia) and Rh antibodies. Later in the pregnancy, blood tests for diabetes will be done along with other tests if problems develop.  Urine tests to check for infections, diabetes, or protein in the urine.  An ultrasound to confirm the proper growth and development of the baby.  An amniocentesis to check for possible genetic problems.  Fetal  screens for spina bifida and Down syndrome.  You may need other tests to make sure you and the baby are doing well. HOME CARE INSTRUCTIONS  Medicines  Follow your health care provider's instructions regarding medicine use. Specific medicines may be either safe or unsafe to take during pregnancy.  Take your prenatal vitamins as directed.  If you develop constipation, try taking a stool softener if your health care provider approves. Diet  Eat regular, well-balanced meals. Choose a variety of foods, such as meat or vegetable-based protein, fish, milk and low-fat dairy products, vegetables, fruits, and whole grain breads and cereals. Your health care provider will help you determine the amount of weight gain that is right for you.  Avoid raw meat and uncooked cheese. These carry germs that can cause birth defects in the baby.  Eating four or five small meals rather than three large meals a day may help relieve nausea and vomiting. If you start to feel nauseous, eating a few soda crackers can be helpful. Drinking liquids between meals instead of during meals also seems to help nausea and vomiting.  If you develop constipation, eat more high-fiber foods, such as fresh vegetables or fruit and whole grains. Drink enough fluids to keep your urine clear or pale yellow. Activity and Exercise  Exercise only as directed by your health care provider. Exercising will help you:  Control your weight.  Stay in shape.  Be prepared for labor and delivery.  Experiencing pain or cramping in the lower abdomen or low back is a good sign that you should stop exercising. Check with your health care provider before continuing normal exercises.  Try to avoid standing for long periods of time. Move your legs often if you must stand in one place for a long time.  Avoid heavy lifting.  Wear low-heeled shoes, and practice good posture.  You may continue to have sex unless your health care provider directs you  otherwise. Relief of Pain or Discomfort  Wear a good support bra for breast tenderness.   Take warm sitz baths to soothe any pain or discomfort caused by hemorrhoids. Use hemorrhoid cream if your health care provider approves.   Rest with your legs elevated if you have leg cramps or low back pain.  If you develop varicose veins in your legs, wear support hose. Elevate your feet for 15 minutes, 3-4 times a day. Limit salt in your diet. Prenatal Care  Schedule your prenatal visits by the twelfth week of pregnancy. They are usually scheduled monthly at first, then more often in the last 2 months before delivery.  Write down your questions. Take them to your prenatal visits.  Keep all your prenatal visits as directed by your health care provider. Safety  Wear your seat belt at all times when driving.  Make a list of emergency phone numbers, including numbers for family, friends, the hospital, and police and fire departments. General   Tips  Ask your health care provider for a referral to a local prenatal education class. Begin classes no later than at the beginning of month 6 of your pregnancy.  Ask for help if you have counseling or nutritional needs during pregnancy. Your health care provider can offer advice or refer you to specialists for help with various needs.  Do not use hot tubs, steam rooms, or saunas.  Do not douche or use tampons or scented sanitary pads.  Do not cross your legs for long periods of time.  Avoid cat litter boxes and soil used by cats. These carry germs that can cause birth defects in the baby and possibly loss of the fetus by miscarriage or stillbirth.  Avoid all smoking, herbs, alcohol, and medicines not prescribed by your health care provider. Chemicals in these affect the formation and growth of the baby.  Schedule a dentist appointment. At home, brush your teeth with a soft toothbrush and be gentle when you floss. SEEK MEDICAL CARE IF:   You have  dizziness.  You have mild pelvic cramps, pelvic pressure, or nagging pain in the abdominal area.  You have persistent nausea, vomiting, or diarrhea.  You have a bad smelling vaginal discharge.  You have pain with urination.  You notice increased swelling in your face, hands, legs, or ankles. SEEK IMMEDIATE MEDICAL CARE IF:   You have a fever.  You are leaking fluid from your vagina.  You have spotting or bleeding from your vagina.  You have severe abdominal cramping or pain.  You have rapid weight gain or loss.  You vomit blood or material that looks like coffee grounds.  You are exposed to German measles and have never had them.  You are exposed to fifth disease or chickenpox.  You develop a severe headache.  You have shortness of breath.  You have any kind of trauma, such as from a fall or a car accident. Document Released: 12/12/2000 Document Revised: 05/04/2013 Document Reviewed: 10/28/2012 Exit[employer] Patient Information 2015 [employer]. This information is not intended to replace advice given to you by your health care provider. Make sure you discuss any questions you have with your health care provider.   Nausea & Vomiting  Have saltine crackers or pretzels by your bed and eat a few bites before you raise your head out of bed in the morning  Eat small frequent meals throughout the day instead of large meals  Drink plenty of fluids throughout the day to stay hydrated, just don't drink a lot of fluids with your meals.  This can make your stomach fill up faster making you feel sick  Do not brush your teeth right after you eat  Products with real ginger are good for nausea, like ginger ale and ginger hard candy Make sure it says made with real ginger!  Sucking on sour candy like lemon heads is also good for nausea  If your prenatal vitamins make you nauseated, take them at night so you will sleep through the nausea  Sea Bands  If you feel like you need  medicine for the nausea & vomiting please let us know  If you are unable to keep any fluids or food down please let us know   Constipation  Drink plenty of fluid, preferably water, throughout the day  Eat foods high in fiber such as fruits, vegetables, and grains  Exercise, such as walking, is a good way to keep your bowels regular  Drink warm fluids, especially warm   prune juice, or decaf coffee  Eat a 1/2 cup of real oatmeal (not instant), 1/2 cup applesauce, and 1/2-1 cup warm prune juice every day  If needed, you may take Colace (docusate sodium) stool softener once or twice a day to help keep the stool soft. If you are pregnant, wait until you are out of your first trimester (12-14 weeks of pregnancy)  If you still are having problems with constipation, you may take Miralax once daily as needed to help keep your bowels regular.  If you are pregnant, wait until you are out of your first trimester (12-14 weeks of pregnancy)  Safe Medications in Pregnancy   Acne: Benzoyl Peroxide Salicylic Acid  Backache/Headache: Tylenol: 2 regular strength every 4 hours OR              2 Extra strength every 6 hours  Colds/Coughs/Allergies: Benadryl (alcohol free) 25 mg every 6 hours as needed Breath right strips Claritin Cepacol throat lozenges Chloraseptic throat spray Cold-Eeze- up to three times per day Cough drops, alcohol free Flonase (by prescription only) Guaifenesin Mucinex Robitussin DM (plain only, alcohol free) Saline nasal spray/drops Sudafed (pseudoephedrine) & Actifed ** use only after [redacted] weeks gestation and if you do not have high blood pressure Tylenol Vicks Vaporub Zinc lozenges Zyrtec   Constipation: Colace Ducolax suppositories Fleet enema Glycerin suppositories Metamucil Milk of magnesia Miralax Senokot Smooth move tea  Diarrhea: Kaopectate Imodium A-D  *NO pepto Bismol  Hemorrhoids: Anusol Anusol HC Preparation  H Tucks  Indigestion: Tums Maalox Mylanta Zantac  Pepcid  Insomnia: Benadryl (alcohol free) 25mg every 6 hours as needed Tylenol PM Unisom, no Gelcaps  Leg Cramps: Tums MagGel  Nausea/Vomiting:  Bonine Dramamine Emetrol Ginger extract Sea bands Meclizine  Nausea medication to take during pregnancy:  Unisom (doxylamine succinate 25 mg tablets) Take one tablet daily at bedtime. If symptoms are not adequately controlled, the dose can be increased to a maximum recommended dose of two tablets daily (1/2 tablet in the morning, 1/2 tablet mid-afternoon and one at bedtime). Vitamin B6 100mg tablets. Take one tablet twice a day (up to 200 mg per day).  Skin Rashes: Aveeno products Benadryl cream or 25mg every 6 hours as needed Calamine Lotion 1% cortisone cream  Yeast infection: Gyne-lotrimin 7 Monistat 7   **If taking multiple medications, please check labels to avoid duplicating the same active ingredients **take medication as directed on the label ** Do not exceed 4000 mg of tylenol in 24 hours **Do not take medications that contain aspirin or ibuprofen      
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
## 2016-06-05 NOTE — Addendum Note (Signed)
Addended by: Octaviano Glow on: 06/05/2016 04:18 PM   Modules accepted: Orders

## 2016-06-06 ENCOUNTER — Ambulatory Visit (INDEPENDENT_AMBULATORY_CARE_PROVIDER_SITE_OTHER): Payer: Medicaid Other | Admitting: Obstetrics and Gynecology

## 2016-06-06 ENCOUNTER — Encounter: Payer: Self-pay | Admitting: Obstetrics and Gynecology

## 2016-06-06 ENCOUNTER — Ambulatory Visit (INDEPENDENT_AMBULATORY_CARE_PROVIDER_SITE_OTHER): Payer: Medicaid Other

## 2016-06-06 VITALS — BP 96/64 | HR 84 | Wt 130.6 lb

## 2016-06-06 DIAGNOSIS — Z331 Pregnant state, incidental: Secondary | ICD-10-CM

## 2016-06-06 DIAGNOSIS — O358XX0 Maternal care for other (suspected) fetal abnormality and damage, not applicable or unspecified: Secondary | ICD-10-CM

## 2016-06-06 DIAGNOSIS — Z3682 Encounter for antenatal screening for nuchal translucency: Secondary | ICD-10-CM

## 2016-06-06 DIAGNOSIS — O35FXX0 Maternal care for other (suspected) fetal abnormality and damage, fetal musculoskeletal anomalies of trunk, not applicable or unspecified: Secondary | ICD-10-CM

## 2016-06-06 DIAGNOSIS — Z1389 Encounter for screening for other disorder: Secondary | ICD-10-CM | POA: Diagnosis not present

## 2016-06-06 LAB — PMP SCREEN PROFILE (10S), URINE
AMPHETAMINE SCREEN URINE: NEGATIVE ng/mL
BARBITURATE SCREEN URINE: NEGATIVE ng/mL
BENZODIAZEPINE SCREEN, URINE: NEGATIVE ng/mL
CANNABINOIDS UR QL SCN: POSITIVE ng/mL
COCAINE(METAB.)SCREEN, URINE: NEGATIVE ng/mL
Creatinine(Crt), U: 263.7 mg/dL (ref 20.0–300.0)
METHADONE SCREEN, URINE: NEGATIVE ng/mL
OXYCODONE+OXYMORPHONE UR QL SCN: NEGATIVE ng/mL
Opiate Scrn, Ur: NEGATIVE ng/mL
PHENCYCLIDINE QUANTITATIVE URINE: NEGATIVE ng/mL
PROPOXYPHENE SCREEN URINE: NEGATIVE ng/mL
Ph of Urine: 5.8 (ref 4.5–8.9)

## 2016-06-06 LAB — POCT URINALYSIS DIPSTICK
Blood, UA: NEGATIVE
GLUCOSE UA: NEGATIVE
Leukocytes, UA: NEGATIVE
NITRITE UA: NEGATIVE
Protein, UA: NEGATIVE

## 2016-06-06 NOTE — Progress Notes (Signed)
Patient ID: Lori Johnston, female   DOB: 03-11-1990, 26 y.o.   MRN: [id number]  Work in for [place] results OMPHALOCELE noted at [redacted]w[redacted]d NT testing  [id number]  Estimated Date of Delivery: 12/17/16 LROB [redacted]w[redacted]d  Chief Complaint  Patient presents with  . W/I OB    go over U/S results  ____  Patient complaints: none. Pt states she and her children have h/o umbilical hernias.   Patient reports too early for fetal movement. She denies any bleeding, rupture of membranes,or regular contractions.  Blood pressure 96/64, pulse 84, weight 130 lb 9.6 oz (59.2 kg), last menstrual period 03/12/2016.   Urine results:notable for small ketones  refer to the ob flow sheet for FH and FHR, ,                          Physical Examination: General appearance - alert, well appearing, and in no distress                                      Abdomen -FHR 166 bpm                                                         soft, nontender, nondistended, no masses or organomegaly                                            Questions were answered. Assessment: LROB [id number] @ [redacted]w[redacted]d Fetal Omphalocele NT  Anatomy review showed 8 mm hernia with marked protrusion of gut thru the abdominal Wall defect. I am unable to assess presence or absence of liver protrusion otherwise normal NT testing with nasal bone present and normal NT thickness    Plan:   1.  initial integrated testing ordered as per protocol 2. Referral to [facility] for Genetic counselling and follow up scan.   The need for additional genetic testing as well as follow-up ultrasounds reviewed with the patient who at this time remains relatively unconcerned, and her comment  is that both her 2 other children had umbilical hernias that did not resolve until several years of age, "after I taped a coin over the hernia". The patient also notes that she had umbilical herniorrhaphy as a child  F/u in 1 week for routine prenatal care and further discussion , as pt has to  leave for child care obligations.. Further discussion to follow  By signing my name below, I, Hansel Feinstein, attest that this documentation has been prepared under the direction and in the presence of Jonnie Kind, MD. Electronically Signed: Hansel Feinstein, ED Scribe. 06/06/16. 11:26 AM.  I personally performed the services described in this documentation, which was SCRIBED in my presence. The recorded information has been reviewed and considered accurate. It has been edited as necessary during review. Jonnie Kind, MD

## 2016-06-06 NOTE — Progress Notes (Signed)
NT/First trimester screening US today @[redacted]w[redacted]d  GA. Single active fetus with FHR 166 bpm. CRL 58.85mm which is consistent with LMP dating. EDD 12/17/16. Bilateral ovaries WNL. Cervix is closed. Anterior placenta. NT 1.18mm. 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 Fluid is subjectively WNL.

## 2016-06-07 LAB — CYTOLOGY - PAP
Adequacy: ABSENT
Chlamydia: NEGATIVE
Diagnosis: NEGATIVE
NEISSERIA GONORRHEA: NEGATIVE

## 2016-06-07 LAB — URINE CULTURE: Organism ID, Bacteria: NO GROWTH

## 2016-06-08 ENCOUNTER — Encounter (HOSPITAL_COMMUNITY): Payer: Self-pay | Admitting: Obstetrics and Gynecology

## 2016-06-11 ENCOUNTER — Telehealth: Payer: Self-pay | Admitting: Obstetrics & Gynecology

## 2016-06-11 NOTE — Telephone Encounter (Signed)
LMOVM returning call 

## 2016-06-13 ENCOUNTER — Ambulatory Visit (HOSPITAL_COMMUNITY)
Admission: RE | Admit: 2016-06-13 | Discharge: 2016-06-13 | Disposition: A | Discharge status: Home / Self Care | Payer: Medicaid Other | Source: Ambulatory Visit | Attending: Obstetrics and Gynecology | Admitting: Obstetrics and Gynecology

## 2016-06-13 ENCOUNTER — Other Ambulatory Visit (HOSPITAL_COMMUNITY): Payer: Self-pay | Admitting: *Deleted

## 2016-06-13 ENCOUNTER — Encounter: Payer: Medicaid Other | Admitting: Women's Health

## 2016-06-13 ENCOUNTER — Encounter (HOSPITAL_COMMUNITY): Payer: Self-pay

## 2016-06-13 DIAGNOSIS — O26891 Other specified pregnancy related conditions, first trimester: Secondary | ICD-10-CM | POA: Insufficient documentation

## 2016-06-13 DIAGNOSIS — O283 Abnormal ultrasonic finding on antenatal screening of mother: Secondary | ICD-10-CM | POA: Insufficient documentation

## 2016-06-13 DIAGNOSIS — Z3A13 13 weeks gestation of pregnancy: Secondary | ICD-10-CM | POA: Diagnosis not present

## 2016-06-13 DIAGNOSIS — Z315 Encounter for genetic counseling: Secondary | ICD-10-CM | POA: Diagnosis present

## 2016-06-13 DIAGNOSIS — O35FXX0 Maternal care for other (suspected) fetal abnormality and damage, fetal musculoskeletal anomalies of trunk, not applicable or unspecified: Secondary | ICD-10-CM

## 2016-06-13 DIAGNOSIS — O358XX0 Maternal care for other (suspected) fetal abnormality and damage, not applicable or unspecified: Secondary | ICD-10-CM

## 2016-06-13 DIAGNOSIS — O359XX0 Maternal care for (suspected) fetal abnormality and damage, unspecified, not applicable or unspecified: Secondary | ICD-10-CM

## 2016-06-13 NOTE — Addendum Note (Signed)
Encounter addended by: Hessie Dibble, RT on: 06/13/2016 12:21 PM<BR>    Actions taken: Imaging Exam ended

## 2016-06-13 NOTE — Progress Notes (Signed)
Genetic Counseling  Visit Summary Note  Appointment Date: 06/13/2016 Referred By: Jonnie Kind, MD  Date of Birth: 1990-04-21  Pregnancy history: [id number] Estimated Date of Delivery: 12/17/16 Estimated Gestational Age: [redacted]w[redacted]d  Ms. Lori Johnston was seen for genetic counseling because a previous ultrasound was concerning for a fetal abdominal wall defect.  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  However, at this gestation, it is likely not physiologic.  Thus, we discussed that there are two different conditions that can result as a failure of normal closure.  We discussed that gastroschisis occurs in approximately 1 in 27,000-10,000 live births, more often in babies born to younger mothers who smoke during pregnancy.  We reviewed that gastroschisis is considered an abdominal wall defect which is characterized by an opening to the right of the umbilicus through which the intestines and other visceral organs protrude.  This opening usually arises from incomplete closure of the lateral folds during the sixth week of gestation.  We discussed that as a consequence of the intestinal herniation, the unprotected bowel may be damaged and not function well after delivery.  We discussed that gastroschisis is typically not associated with chromosomal anomalies or other structural malformations with the exception  of intestinal atresia.  The majority of cases of gastroschisis are thought to be sporadic and multifactorial in etiology, with a low risk of recurrence.  However, there have been reports of both autosomal recessive and dominant inheritance.  The other type of abdominal wall defect is an omphalocele.  This occurs 1 in approximately in every 4000 births (M1:F5) and is defined as the protrusion of abdominal viscera through the umbilical ring, covered by a membrane. This defect is thought to arise from failure of lateral body migration and body wall closure or from the embryonic persistance of the body stalk.  We discussed that about two thirds of all cases have associated anomalies, most commonly including: cardiac defects, neural tube defects, and cleft lip with or without cleft palate.  We reviewed the common causes of omphaloceles, including sporadic, multifactorial, and genetic etiologies.  Specifically, we discussed that omphaloceles are associated with a 30-50% risk of fetal aneuploidy, complexes such as OEIS, and genetic syndromes including Beckwith-Wiedemann syndrome (BWS) and single gene conditions.    Given the early gestation, and the uncertainty of the diagnosis and the varying risks for chromosome conditions, we discussed the available screening options, including First Screen, Quad screen, noninvasive prenatal screening (NIPS)/cell free DNA (cfDNA) screening, and detailed ultrasound.  She was counseled that screening tests are used to modify a patient's a priori risk for aneuploidy, typically based on age (and therefore would not take into account the ultrasound findings). This estimate provides a pregnancy specific risk assessment. We reviewed the benefits and limitations of each option. Specifically, we discussed the conditions for which each test screens, the detection rates, and false positive rates of each.  She was also counseled regarding diagnostic  testing via CVS and amniocentesis. We  reviewed the approximate 1 in 100 risk for pregnancy loss with CVS and the 1 in 161-096 risk for complications for amniocentesis, including spontaneous pregnancy loss. After consideration of all the options, she elected to proceed with NIPS (Maternit Genome).  Those results will be available in 10-14 days.    Ms. Lori Johnston will return in four weeks for a complete anatomy ultrasound.  We discussed management and prognosis for abdominal wall defects.  She understands that the overall prognosis depends upon the underlying etiology and specific anomaly identified; however, if this is an isolated defect and nonsyndromic (therefore also not due to a chromosome abnormality), the prognosis is relatively good.  If this is not isolated (ie: there are other anomalies that we have just not been able to see yet) or there is an underlying chromosome condition, then the outcome would be dependent upon those other diagnoses.  We reviewed the option of meeting with a pediatric surgeon to review the surgical approach and postnatal management, once we are more clear on the findings.  In addition, we discussed the recommendation for fetal echocardiogram, and continued ultrasound surveillance.    Ms. Lori Johnston was provided with written information regarding cystic fibrosis (CF), spinal muscular atrophy (SMA), and hemoglobinopathies, including the carrier frequency and the availability of carrier screening and prenatal diagnosis if indicated.  In addition, we discussed that CF and hemoglobinopathies are routinely screened for as part of the [place] newborn screening panel, and SMA is available as an opt-in.  After further discussion, she declined screening for CF, SMA, and hemoglobinopathies today.  Both family histories were reviewed.  Ms. Lori Johnston reported that she had surgery for an umbilical hernia when she was 26 years of age.  She started having problems with it after her second child was born (4 years ago).   Both of her boys had umbilical  hernias at birth and her grandmother told her to put a fifty cent piece over the hernia and tie it with a piece of cloth (the doctor had suggested a belly band but Ms. Lori Johnston stated she couldn't afford that).   Lori Johnston stated that all 8 of her grandmother's children had umbilical hernias as well.  It is hard to know how or if this family history is contributory.  It could indicate a dominant abdominal wall condition, and the current ultrasound finding could be a more significant manifestation of the same condition, or it could be unrelated.  The remaining family histories were reviewed and found to be noncontributory for birth defects, intellectual disability, and known genetic conditions. Without further information regarding the provided family history, an accurate genetic risk cannot be calculated. Further genetic counseling is warranted if more information is obtained.  Ms. Lori Johnston denied exposure to environmental toxins or chemical agents. She denied the use of alcohol, tobacco or street drugs. She denied significant viral illnesses during the course of her pregnancy. Her medical and surgical histories were noncontributory.   I counseled Ms. Lori Johnston regarding the above risks and available options. The approximate face-to-face time with the genetic counselor was 50 minutes.  Cam Hai, MS,  Certified Genetic Counselor

## 2016-06-17 ENCOUNTER — Other Ambulatory Visit: Payer: Self-pay

## 2016-06-22 ENCOUNTER — Telehealth (HOSPITAL_COMMUNITY): Payer: Self-pay | Admitting: MS"

## 2016-06-22 NOTE — Telephone Encounter (Signed)
Attempted to contact patient regarding results of noninvasive prenatal screening (MaterniTGenome), which are within normal limits. Left message for patient to return call.   Lori Johnston  06/22/2016 4:43 PM

## 2016-06-26 NOTE — Telephone Encounter (Signed)
Called Lori Johnston to discuss her prenatal cell free DNA test results.  Mrs. Lori Johnston had MaterniT Genome testing through [employer] [facility]  Testing was offered because of abnormal ultrasound findings. The patient was identified by name and DOB.  We reviewed that these are within normal limits, which reduces the likelihood of large deletions and duplications of chromosome material as well as the chance for specific fetal aneuploidy and microdeletions. Testing was also consistent with female fetal sex. The patient did wish to know fetal sex. She understands that this testing does not identify all genetic conditions.  All questions were answered to her satisfaction, she was encouraged to call with additional questions or concerns.  Sharyne Richters, MS Certified Genetic Counselor

## 2016-07-02 ENCOUNTER — Telehealth: Payer: Self-pay | Admitting: Obstetrics & Gynecology

## 2016-07-02 NOTE — Telephone Encounter (Signed)
LMOVM returning call to try Tylenol, cool compresses to forehead or eyes along with small frequent meals to prevent fluctuations in blood sugar. Advised to keep her appointment tomorrow with Dr Elonda Husky to discuss problems.

## 2016-07-03 ENCOUNTER — Ambulatory Visit (INDEPENDENT_AMBULATORY_CARE_PROVIDER_SITE_OTHER): Payer: Medicaid Other | Admitting: Obstetrics & Gynecology

## 2016-07-03 ENCOUNTER — Encounter: Payer: Self-pay | Admitting: Obstetrics & Gynecology

## 2016-07-03 VITALS — BP 90/50 | HR 59 | Wt 135.0 lb

## 2016-07-03 DIAGNOSIS — O35FXX0 Maternal care for other (suspected) fetal abnormality and damage, fetal musculoskeletal anomalies of trunk, not applicable or unspecified: Secondary | ICD-10-CM

## 2016-07-03 DIAGNOSIS — O358XX0 Maternal care for other (suspected) fetal abnormality and damage, not applicable or unspecified: Secondary | ICD-10-CM

## 2016-07-03 DIAGNOSIS — Z1389 Encounter for screening for other disorder: Secondary | ICD-10-CM | POA: Diagnosis not present

## 2016-07-03 DIAGNOSIS — Z3A16 16 weeks gestation of pregnancy: Secondary | ICD-10-CM | POA: Diagnosis not present

## 2016-07-03 DIAGNOSIS — Z331 Pregnant state, incidental: Secondary | ICD-10-CM

## 2016-07-03 DIAGNOSIS — O09892 Supervision of other high risk pregnancies, second trimester: Secondary | ICD-10-CM

## 2016-07-03 DIAGNOSIS — O0992 Supervision of high risk pregnancy, unspecified, second trimester: Secondary | ICD-10-CM

## 2016-07-03 LAB — POCT URINALYSIS DIPSTICK
Glucose, UA: NEGATIVE
Ketones, UA: NEGATIVE
Leukocytes, UA: NEGATIVE
NITRITE UA: NEGATIVE
PROTEIN UA: NEGATIVE
RBC UA: NEGATIVE

## 2016-07-03 NOTE — Progress Notes (Signed)
Fetal Surveillance Testing today:  157   High Risk Pregnancy Diagnosis(es):   Fetal omphalocele  [id number] [redacted]w[redacted]d Estimated Date of Delivery: 12/17/16  Blood pressure (!) 90/50, pulse (!) 59, weight 135 lb (61.2 kg), last menstrual period 03/12/2016.  Urinalysis: Negative   HPI: The patient is being seen today for ongoing management of as above. Today she reports headaches which we discussed and reviewed Reviewed self care approaches   BP weight and urine results all reviewed and noted. Patient reports good fetal movement, denies any bleeding and no rupture of membranes symptoms or regular contractions.  Fundal Height:   Fetal Heart rate:  157 Edema:  non  Patient is without complaints other than noted in her HPI. All questions were answered.  All lab and sonogram results have been reviewed. Comments:    Assessment:  1.  Pregnancy at [redacted]w[redacted]d,  Estimated Date of Delivery: 12/17/16 :                          2.  Fetal omphalocele                        3.    Medication(s) Plans:  None  Treatment Plan:  Patient is going to have her next ultrasound through maternal fetal medicine week first and July 12 and I will just allow her to get them there Early we will have to do twice weekly testing on her beginning at 32 weeks and will see her frequently for that but she'll be delivering at [facility] with the pediatric surgeon team available at the time of delivery  Return in about 4 weeks (around 07/31/2016) for [place]. for appointment for high risk OB care  No orders of the defined types were placed in this encounter.  Orders Placed This Encounter  Procedures  . POCT urinalysis dipstick

## 2016-07-12 ENCOUNTER — Ambulatory Visit (HOSPITAL_COMMUNITY)
Admission: RE | Admit: 2016-07-12 | Discharge: 2016-07-12 | Disposition: A | Discharge status: Home / Self Care | Payer: Medicaid Other | Source: Ambulatory Visit | Attending: Obstetrics and Gynecology | Admitting: Obstetrics and Gynecology

## 2016-07-13 ENCOUNTER — Encounter (HOSPITAL_COMMUNITY): Payer: Self-pay

## 2016-07-13 ENCOUNTER — Ambulatory Visit (HOSPITAL_COMMUNITY)
Admission: RE | Admit: 2016-07-13 | Discharge: 2016-07-13 | Disposition: A | Discharge status: Home / Self Care | Payer: Medicaid Other | Source: Ambulatory Visit | Attending: Obstetrics and Gynecology | Admitting: Obstetrics and Gynecology

## 2016-07-13 DIAGNOSIS — Z3A17 17 weeks gestation of pregnancy: Secondary | ICD-10-CM | POA: Insufficient documentation

## 2016-07-13 DIAGNOSIS — Z3481 Encounter for supervision of other normal pregnancy, first trimester: Secondary | ICD-10-CM

## 2016-07-13 DIAGNOSIS — O358XX0 Maternal care for other (suspected) fetal abnormality and damage, not applicable or unspecified: Secondary | ICD-10-CM | POA: Insufficient documentation

## 2016-07-13 DIAGNOSIS — Z363 Encounter for antenatal screening for malformations: Secondary | ICD-10-CM | POA: Diagnosis not present

## 2016-07-13 DIAGNOSIS — O359XX0 Maternal care for (suspected) fetal abnormality and damage, unspecified, not applicable or unspecified: Secondary | ICD-10-CM
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
## 2016-07-16 ENCOUNTER — Telehealth: Payer: Self-pay | Admitting: Obstetrics and Gynecology

## 2016-07-16 ENCOUNTER — Other Ambulatory Visit (HOSPITAL_COMMUNITY): Payer: Self-pay | Admitting: *Deleted

## 2016-07-16 DIAGNOSIS — Q792 Exomphalos: Secondary | ICD-10-CM

## 2016-07-17 NOTE — Telephone Encounter (Signed)
Chart reviewed no further documentation at this time

## 2016-07-20 ENCOUNTER — Inpatient Hospital Stay (HOSPITAL_COMMUNITY)
Admission: AD | Admit: 2016-07-20 | Discharge: 2016-07-20 | Disposition: A | Discharge status: Home / Self Care | Payer: Medicaid Other | Source: Ambulatory Visit | Attending: Obstetrics & Gynecology | Admitting: Obstetrics & Gynecology

## 2016-07-20 DIAGNOSIS — Z3A18 18 weeks gestation of pregnancy: Secondary | ICD-10-CM | POA: Diagnosis not present

## 2016-07-20 DIAGNOSIS — B9689 Other specified bacterial agents as the cause of diseases classified elsewhere: Secondary | ICD-10-CM | POA: Diagnosis not present

## 2016-07-20 DIAGNOSIS — Z87891 Personal history of nicotine dependence: Secondary | ICD-10-CM | POA: Insufficient documentation

## 2016-07-20 DIAGNOSIS — N760 Acute vaginitis: Secondary | ICD-10-CM

## 2016-07-20 DIAGNOSIS — O23592 Infection of other part of genital tract in pregnancy, second trimester: Secondary | ICD-10-CM | POA: Diagnosis not present

## 2016-07-20 DIAGNOSIS — O26852 Spotting complicating pregnancy, second trimester: Secondary | ICD-10-CM

## 2016-07-20 DIAGNOSIS — O4692 Antepartum hemorrhage, unspecified, second trimester: Secondary | ICD-10-CM | POA: Diagnosis present

## 2016-07-20 LAB — URINALYSIS, ROUTINE W REFLEX MICROSCOPIC
BILIRUBIN URINE: NEGATIVE
Bacteria, UA: NONE SEEN
Glucose, UA: NEGATIVE mg/dL
KETONES UR: NEGATIVE mg/dL
Nitrite: NEGATIVE
PH: 7 (ref 5.0–8.0)
PROTEIN: 30 mg/dL — AB
Specific Gravity, Urine: 1.018 (ref 1.005–1.030)

## 2016-07-20 LAB — WET PREP, GENITAL
Clue Cells Wet Prep HPF POC: NONE SEEN
Sperm: NONE SEEN
TRICH WET PREP: NONE SEEN
Yeast Wet Prep HPF POC: NONE SEEN

## 2016-07-20 MED ORDER — METRONIDAZOLE 500 MG PO TABS: 500.0000 mg | ORAL_TABLET | Freq: Two times a day (BID) | ORAL | 0 refills | Status: DC

## 2016-07-20 NOTE — MAU Note (Signed)
Complaint - Vaginal Bleeding Onset - 5:00 am was heavy like a period - nothing on pad No recent intercourse  Abdominal Cramping Rates 6/10  High Risk Pregnancy - Followed by MFM

## 2016-07-20 NOTE — MAU Provider Note (Signed)
History     CSN: [id number]  Arrival date and time: 07/20/16 1056   First Provider Initiated Contact with Patient 07/20/16 1131      Chief Complaint  Patient presents with  . Vaginal Bleeding   26 year old [id number] at [redacted]w[redacted]d presenting for vaginal bleeding. Patient woke up this morning and noticed blood on toilet paper upon wiping. Did not continue to bleeding but had spotting on the toilet paper after arrival here. No clots passed. She also endorses some right sided sharp cramping originating near umbilicus and radiating to right flank. She has been told this is round ligament pain by providers. History of hernia repair in 2016 and has a plate near the site of her pain. She says that her pain was worse this morning than normal. She is not in any pain right now. She doesn't take anything for pain. No trauma, no cigarette or drug use.   Patient has high risk pregnancy and is followed by MFM for giant omphalocele and gonorrhea in first trimester. No previa or LL placenta on last [place].     OB History    Gravida Para Term Preterm AB Living   3 2 1 1  0 2   SAB TAB Ectopic Multiple Live Births   0 0 0 0 2      Past Medical History:  Diagnosis Date  . Abnormal uterine bleeding (AUB) 05/28/2014  . Bronchitis   . BV (bacterial vaginosis) 12/10/2012  . Contraceptive education 07/16/2013  . Contraceptive management 04/21/2015  . Decreased appetite 05/28/2014  . Dyspareunia 12/10/2012  . Gonorrhea affecting pregnancy in first trimester 05/04/2016   Treated in ER 5/1  . Hematuria 07/06/2015  . History of chlamydia   . History of PID 07/16/2013  . Irregular intermenstrual bleeding 07/06/2015  . Irregular periods 01/18/2015  . Migraine   . Nausea 01/18/2015  . Nexplanon in place 07/06/2014  . Nexplanon insertion 08/27/2013  . Pain with urination 07/06/2015  . PID (pelvic inflammatory disease)   . RUQ pain 07/16/2013  . Strain of rectus abdominis muscle 06/10/2014  . Umbilical hernia 07/03/7104  . UTI  (lower urinary tract infection)   . Vaginal discharge 12/10/2012  . Weight loss 07/06/2014  . Wheezing on expiration 12/09/2014  . Yeast infection 07/16/2013    Past Surgical History:  Procedure Laterality Date  . DILATION AND CURETTAGE OF UTERUS N/A 03/18/2012   Procedure: DILATATION AND CURETTAGE;  Surgeon: Mora Bellman, MD;  Location: [facility];  Service: Gynecology;  Laterality: N/A;  . INSERTION OF MESH N/A 08/18/2014   Procedure: INSERTION OF MESH;  Surgeon: [name], MD;  Location: [facility];  Service: General;  Laterality: N/A;  . tooth pulled    . UMBILICAL HERNIA REPAIR N/A 08/18/2014   Procedure: UMBILICAL HERNIORRHAPHY;  Surgeon: [name], MD;  Location: [facility];  Service: General;  Laterality: N/A;  . WISDOM TOOTH EXTRACTION      Family History  Problem Relation Age of Onset  . Diabetes Cousin   . Ulcers Cousin   . Deafness Paternal Grandmother   . Migraines Paternal Grandmother   . Migraines Mother   . Migraines Sister   . Migraines Sister   . Heart attack Other        maternal great great grandma  . Mental illness Maternal Aunt   . Anesthesia problems Neg Hx   . Hypotension Neg Hx   . Malignant hyperthermia Neg Hx   . Pseudochol deficiency Neg Hx   .  Colon cancer Neg Hx     Social History  Substance Use Topics  . Smoking status: Former Smoker    Packs/day: 0.20    Years: 2.00    Types: Cigarettes    Quit date: 08/12/2010  . Smokeless tobacco: Never Used  . Alcohol use No     Comment: rarely (holidays and family events)    Allergies:  Allergies  Allergen Reactions  . Bee Venom Anaphylaxis  . Other Anaphylaxis, Swelling and Rash    Tomatoes.    Prescriptions Prior to Admission  Medication Sig Dispense Refill Last Dose  . Biotin w/ Vitamins C & E (HAIR SKIN & NAILS GUMMIES) 1250-7.5-7.5 MCG-MG-UNT CHEW Chew by mouth.   Taking  . Doxylamine-Pyridoxine (DICLEGIS) 10-10 MG TBEC Take two tabs PO at nght time. If vomiting persists after 2 days, you may  take 1 tab every morning and 1 tab at night time (Patient not taking: Reported on 06/05/2016) 60 tablet 0 Not Taking  . Multiple Vitamins-Minerals (MULTI-VITAMIN GUMMIES) CHEW Chew 1 tablet by mouth daily.    Taking  . promethazine (PHENERGAN) 6.25 MG/5ML syrup Take 10 mLs (12.5 mg total) by mouth every 6 (six) hours as needed for nausea or vomiting. (Patient not taking: Reported on 07/03/2016) 120 mL 0 Not Taking   Review of Systems  Constitutional: Negative for activity change, appetite change, chills, fatigue and fever.  Respiratory: Negative.   Cardiovascular: Negative.   Gastrointestinal: Negative.   Genitourinary: Positive for flank pain, pelvic pain and vaginal bleeding. Negative for dyspareunia, dysuria, frequency, hematuria, urgency, vaginal discharge and vaginal pain.  Neurological: Negative.   Psychiatric/Behavioral: Negative.    Physical Exam   Blood pressure (!) 103/51, pulse 86, resp. rate 16, height 5' 7.75" (1.721 m), weight 64 kg (141 lb 0.6 oz), last menstrual period 03/12/2016.  Physical Exam  Constitutional: She is oriented to person, place, and time. She appears well-developed and well-nourished. No distress.  Cardiovascular: Normal rate, regular rhythm and normal heart sounds.  Exam reveals no gallop and no friction rub.   No murmur heard. Respiratory: Effort normal and breath sounds normal.  GI: Soft. Bowel sounds are normal. She exhibits no distension and no mass. There is no rebound and no guarding.  Right lower quadrant and periumbilical tenderness  Genitourinary:  Genitourinary Comments: Scant vaginal bleeding with malodorous white, thick frothy discharge. No active bleeding. Cervix is thick and closed  Musculoskeletal: Normal range of motion.  Neurological: She is alert and oriented to person, place, and time.  FHT: 158   Results for orders placed or performed during the hospital encounter of 07/20/16 (from the past 24 hour(s))  Urinalysis, Routine w reflex  microscopic     Status: Abnormal   Collection Time: 07/20/16 11:10 AM  Result Value Ref Range   Color, Urine YELLOW YELLOW   APPearance CLOUDY (A) CLEAR   Specific Gravity, Urine 1.018 1.005 - 1.030   pH 7.0 5.0 - 8.0   Glucose, UA NEGATIVE NEGATIVE mg/dL   Hgb urine dipstick MODERATE (A) NEGATIVE   Bilirubin Urine NEGATIVE NEGATIVE   Ketones, ur NEGATIVE NEGATIVE mg/dL   Protein, ur 30 (A) NEGATIVE mg/dL   Nitrite NEGATIVE NEGATIVE   Leukocytes, UA TRACE (A) NEGATIVE   RBC / HPF 0-5 0 - 5 RBC/hpf   WBC, UA 0-5 0 - 5 WBC/hpf   Bacteria, UA NONE SEEN NONE SEEN   Squamous Epithelial / LPF 6-30 (A) NONE SEEN   Mucous PRESENT   Wet prep, genital  Status: Abnormal   Collection Time: 07/20/16 12:18 PM  Result Value Ref Range   Yeast Wet Prep HPF POC NONE SEEN NONE SEEN   Trich, Wet Prep NONE SEEN NONE SEEN   Clue Cells Wet Prep HPF POC NONE SEEN NONE SEEN   WBC, Wet Prep HPF POC FEW (A) NONE SEEN   Sperm NONE SEEN     MAU Course  Procedures  MDM: No indications of abruption, previa, or SAB. Labs and wet prep suggest no infection however, high index of suspicion for BV given patient symptoms, discharge and odor. Will plan to treat for BV. Stable for discharge home.  Assessment and Plan   1. [redacted] weeks gestation of pregnancy   2. Spotting affecting pregnancy in second trimester   3. Bacterial vaginosis   4.      Rh positive  Discharge home Metronidazole 500 mg BID for one week Follow up with routine OB care GC/Chlamydia pending at time of discharge SAB precautions  Salome Arnt, Medical Student 07/20/2016, 1:11 PM   I confirm that I have verified the information documented in the student's note and that I have also personally reperformed the physical exam and all medical decision making activities.  Julianne Handler, CNM  07/20/2016 1:40 PM

## 2016-07-20 NOTE — Discharge Instructions (Signed)
Bacterial Vaginosis Bacterial vaginosis is a vaginal infection that occurs when the normal balance of bacteria in the vagina is disrupted. It results from an overgrowth of certain bacteria. This is the most common vaginal infection among women ages 7-44. Because bacterial vaginosis increases your risk for STIs (sexually transmitted infections), getting treated can help reduce your risk for chlamydia, gonorrhea, herpes, and HIV (human immunodeficiency virus). Treatment is also important for preventing complications in pregnant women, because this condition can cause an early (premature) delivery. What are the causes? This condition is caused by an increase in harmful bacteria that are normally present in small amounts in the vagina. However, the reason that the condition develops is not fully understood. What increases the risk? The following factors may make you more likely to develop this condition:  Having a new sexual partner or multiple sexual partners.  Having unprotected sex.  Douching.  Having an intrauterine device (IUD).  Smoking.  Drug and alcohol abuse.  Taking certain antibiotic medicines.  Being pregnant.  You cannot get bacterial vaginosis from toilet seats, bedding, swimming pools, or contact with objects around you. What are the signs or symptoms? Symptoms of this condition include:  Grey or white vaginal discharge. The discharge can also be watery or foamy.  A fish-like odor with discharge, especially after sexual intercourse or during menstruation.  Itching in and around the vagina.  Burning or pain with urination.  Some women with bacterial vaginosis have no signs or symptoms. How is this diagnosed? This condition is diagnosed based on:  Your medical history.  A physical exam of the vagina.  Testing a sample of vaginal fluid under a microscope to look for a large amount of bad bacteria or abnormal cells. Your health care provider may use a cotton swab  or a small wooden spatula to collect the sample.  How is this treated? This condition is treated with antibiotics. These may be given as a pill, a vaginal cream, or a medicine that is put into the vagina (suppository). If the condition comes back after treatment, a second round of antibiotics may be needed. Follow these instructions at home: Medicines  Take over-the-counter and prescription medicines only as told by your health care provider.  Take or use your antibiotic as told by your health care provider. Do not stop taking or using the antibiotic even if you start to feel better. General instructions  If you have a female sexual partner, tell her that you have a vaginal infection. She should see her health care provider and be treated if she has symptoms. If you have a female sexual partner, he does not need treatment.  During treatment: ? Avoid sexual activity until you finish treatment. ? Do not douche. ? Avoid alcohol as directed by your health care provider. ? Avoid breastfeeding as directed by your health care provider.  Drink enough water and fluids to keep your urine clear or pale yellow.  Keep the area around your vagina and rectum clean. ? Wash the area daily with warm water. ? Wipe yourself from front to back after using the toilet.  Keep all follow-up visits as told by your health care provider. This is important. How is this prevented?  Do not douche.  Wash the outside of your vagina with warm water only.  Use protection when having sex. This includes latex condoms and dental dams.  Limit how many sexual partners you have. To help prevent bacterial vaginosis, it is best to have sex with just  one partner (monogamous).  Make sure you and your sexual partner are tested for STIs.  Wear cotton or cotton-lined underwear.  Avoid wearing tight pants and pantyhose, especially during summer.  Limit the amount of alcohol that you drink.  Do not use any products that  contain nicotine or tobacco, such as cigarettes and e-cigarettes. If you need help quitting, ask your health care provider.  Do not use illegal drugs. Where to find more information:  Centers for Disease Control and Prevention: AppraiserFraud.fi  American Sexual Health Association (ASHA): www.ashastd.org  [place] Department of Health and Financial controller, Office on Women's Health: DustingSprays.pl or SecuritiesCard.it Contact a health care provider if:  Your symptoms do not improve, even after treatment.  You have more discharge or pain when urinating.  You have a fever.  You have pain in your abdomen.  You have pain during sex.  You have vaginal bleeding between periods. Summary  Bacterial vaginosis is a vaginal infection that occurs when the normal balance of bacteria in the vagina is disrupted.  Because bacterial vaginosis increases your risk for STIs (sexually transmitted infections), getting treated can help reduce your risk for chlamydia, gonorrhea, herpes, and HIV (human immunodeficiency virus). Treatment is also important for preventing complications in pregnant women, because the condition can cause an early (premature) delivery.  This condition is treated with antibiotic medicines. These may be given as a pill, a vaginal cream, or a medicine that is put into the vagina (suppository). This information is not intended to replace advice given to you by your health care provider. Make sure you discuss any questions you have with your health care provider. Document Released: 12/18/2004 Document Revised: 09/03/2015 Document Reviewed: 09/03/2015 Elsevier Interactive Patient Education  2017 Elsevier Inc. Vaginal Bleeding During Pregnancy, Second Trimester A small amount of bleeding (spotting) from the vagina is relatively common in pregnancy. It usually stops on its own. Various things can cause bleeding or spotting in pregnancy. Some  bleeding may be related to the pregnancy, and some may not. Sometimes the bleeding is normal and is not a problem. However, bleeding can also be a sign of something serious. Be sure to tell your health care provider about any vaginal bleeding right away. Some possible causes of vaginal bleeding during the second trimester include:  Infection, inflammation, or growths on the cervix.  The placenta may be partially or completely covering the opening of the cervix inside the uterus (placenta previa).  The placenta may have separated from the uterus (abruption of the placenta).  You may be having early (preterm) labor.  The cervix may not be strong enough to keep a baby inside the uterus (cervical insufficiency).  Tiny cysts may have developed in the uterus instead of pregnancy tissue (molar pregnancy).  Follow these instructions at home: Watch your condition for any changes. The following actions may help to lessen any discomfort you are feeling:  Follow your health care provider's instructions for limiting your activity. If your health care provider orders bed rest, you may need to stay in bed and only get up to use the bathroom. However, your health care provider may allow you to continue light activity.  If needed, make plans for someone to help with your regular activities and responsibilities while you are on bed rest.  Keep track of the number of pads you use each day, how often you change pads, and how soaked (saturated) they are. Write this down.  Do not use tampons. Do not douche.  Do not  have sexual intercourse or orgasms until approved by your health care provider.  If you pass any tissue from your vagina, save the tissue so you can show it to your health care provider.  Only take over-the-counter or prescription medicines as directed by your health care provider.  Do not take aspirin because it can make you bleed.  Do not exercise or perform any strenuous activities or heavy  lifting without your health care provider's permission.  Keep all follow-up appointments as directed by your health care provider.  Contact a health care provider if:  You have any vaginal bleeding during any part of your pregnancy.  You have cramps or labor pains.  You have a fever, not controlled by medicine. Get help right away if:  You have severe cramps in your back or belly (abdomen).  You have contractions.  You have chills.  You pass large clots or tissue from your vagina.  Your bleeding increases.  You feel light-headed or weak, or you have fainting episodes.  You are leaking fluid or have a gush of fluid from your vagina. This information is not intended to replace advice given to you by your health care provider. Make sure you discuss any questions you have with your health care provider. Document Released: 09/27/2004 Document Revised: 05/26/2015 Document Reviewed: 08/25/2012 Elsevier Interactive Patient Education  [employer].

## 2016-07-23 LAB — GC/CHLAMYDIA PROBE AMP (~~LOC~~) NOT AT ARMC
CHLAMYDIA, DNA PROBE: NEGATIVE
NEISSERIA GONORRHEA: NEGATIVE

## 2016-07-29 ENCOUNTER — Inpatient Hospital Stay (HOSPITAL_COMMUNITY)
Admission: AD | Admit: 2016-07-29 | Discharge: 2016-07-29 | Disposition: A | Discharge status: Home / Self Care | Payer: Medicaid Other | Source: Ambulatory Visit | Attending: Obstetrics and Gynecology | Admitting: Obstetrics and Gynecology

## 2016-07-29 ENCOUNTER — Encounter (HOSPITAL_COMMUNITY): Payer: Self-pay | Admitting: *Deleted

## 2016-07-29 DIAGNOSIS — O26892 Other specified pregnancy related conditions, second trimester: Secondary | ICD-10-CM | POA: Diagnosis not present

## 2016-07-29 DIAGNOSIS — R109 Unspecified abdominal pain: Secondary | ICD-10-CM | POA: Diagnosis present

## 2016-07-29 DIAGNOSIS — R51 Headache: Secondary | ICD-10-CM | POA: Diagnosis not present

## 2016-07-29 DIAGNOSIS — Z87891 Personal history of nicotine dependence: Secondary | ICD-10-CM | POA: Insufficient documentation

## 2016-07-29 DIAGNOSIS — H9209 Otalgia, unspecified ear: Secondary | ICD-10-CM | POA: Insufficient documentation

## 2016-07-29 DIAGNOSIS — H9203 Otalgia, bilateral: Secondary | ICD-10-CM

## 2016-07-29 DIAGNOSIS — Z3A19 19 weeks gestation of pregnancy: Secondary | ICD-10-CM | POA: Diagnosis not present

## 2016-07-29 DIAGNOSIS — Z9889 Other specified postprocedural states: Secondary | ICD-10-CM | POA: Diagnosis not present

## 2016-07-29 LAB — URINALYSIS, ROUTINE W REFLEX MICROSCOPIC
BILIRUBIN URINE: NEGATIVE
Glucose, UA: NEGATIVE mg/dL
HGB URINE DIPSTICK: NEGATIVE
KETONES UR: NEGATIVE mg/dL
NITRITE: NEGATIVE
PH: 8 (ref 5.0–8.0)
Protein, ur: NEGATIVE mg/dL
SPECIFIC GRAVITY, URINE: 1.014 (ref 1.005–1.030)

## 2016-07-29 MED ORDER — ACETAMINOPHEN 500 MG PO TABS
1000.0000 mg | ORAL_TABLET | Freq: Once | ORAL | Status: AC
  Administered 2016-07-29: 1000 mg via ORAL
  Filled 2016-07-29: qty 2

## 2016-07-29 MED ORDER — CETIRIZINE-PSEUDOEPHEDRINE ER 5-120 MG PO TB12: 1.0000 | ORAL_TABLET | Freq: Every day | ORAL | 0 refills | Status: DC

## 2016-07-29 NOTE — Discharge Instructions (Signed)

## 2016-07-29 NOTE — MAU Note (Signed)
Symptoms started Thursday, got nauseated @ work and vomited.  Abd. Pain below belly button, but above pubic area, bilateral ear pain started Friday.  Right ear first.  Sore throat pain started Friday.

## 2016-07-29 NOTE — MAU Provider Note (Signed)
History   G3P1102 @ 19.6 wks in with c/o abd pain states is around area of her hetnia repair. Earache and headache. States she has not taken anything for it at home.  CSN: [id number]  Arrival date & time 07/29/16  2025   None     Chief Complaint  Patient presents with  . Headache  . Sore Throat  . Otalgia  . Abdominal Pain    HPI  Past Medical History:  Diagnosis Date  . Abnormal uterine bleeding (AUB) 05/28/2014  . Bronchitis   . BV (bacterial vaginosis) 12/10/2012  . Contraceptive education 07/16/2013  . Contraceptive management 04/21/2015  . Decreased appetite 05/28/2014  . Dyspareunia 12/10/2012  . Gonorrhea affecting pregnancy in first trimester 05/04/2016   Treated in ER 5/1  . Hematuria 07/06/2015  . History of chlamydia   . History of PID 07/16/2013  . Irregular intermenstrual bleeding 07/06/2015  . Irregular periods 01/18/2015  . Migraine   . Nausea 01/18/2015  . Nexplanon in place 07/06/2014  . Nexplanon insertion 08/27/2013  . Pain with urination 07/06/2015  . PID (pelvic inflammatory disease)   . RUQ pain 07/16/2013  . Strain of rectus abdominis muscle 06/10/2014  . Umbilical hernia 04/02/7060  . UTI (lower urinary tract infection)   . Vaginal discharge 12/10/2012  . Weight loss 07/06/2014  . Wheezing on expiration 12/09/2014  . Yeast infection 07/16/2013    Past Surgical History:  Procedure Laterality Date  . DILATION AND CURETTAGE OF UTERUS N/A 03/18/2012   Procedure: DILATATION AND CURETTAGE;  Surgeon: Mora Bellman, MD;  Location: [facility];  Service: Gynecology;  Laterality: N/A;  . INSERTION OF MESH N/A 08/18/2014   Procedure: INSERTION OF MESH;  Surgeon: [name], MD;  Location: [facility];  Service: General;  Laterality: N/A;  . tooth pulled    . UMBILICAL HERNIA REPAIR N/A 08/18/2014   Procedure: UMBILICAL HERNIORRHAPHY;  Surgeon: [name], MD;  Location: [facility];  Service: General;  Laterality: N/A;  . WISDOM TOOTH EXTRACTION      Family History  Problem  Relation Age of Onset  . Diabetes Cousin   . Ulcers Cousin   . Deafness Paternal Grandmother   . Migraines Paternal Grandmother   . Migraines Mother   . Migraines Sister   . Migraines Sister   . Heart attack Other        maternal great great grandma  . Mental illness Maternal Aunt   . Anesthesia problems Neg Hx   . Hypotension Neg Hx   . Malignant hyperthermia Neg Hx   . Pseudochol deficiency Neg Hx   . Colon cancer Neg Hx     Social History  Substance Use Topics  . Smoking status: Former Smoker    Packs/day: 0.20    Years: 2.00    Types: Cigarettes    Quit date: 08/12/2010  . Smokeless tobacco: Never Used  . Alcohol use No     Comment: rarely (holidays and family events)    OB History    Gravida Para Term Preterm AB Living   3 2 1 1  0 2   SAB TAB Ectopic Multiple Live Births   0 0 0 0 2      Review of Systems  Constitutional: Negative.   HENT: Positive for ear pain.   Eyes: Negative.   Respiratory: Negative.   Cardiovascular: Negative.   Gastrointestinal: Positive for abdominal pain.  Endocrine: Negative.   Genitourinary: Negative.   Musculoskeletal: Negative.   Skin: Negative.  Allergic/Immunologic: Negative.   Neurological: Negative.   Hematological: Negative.   Psychiatric/Behavioral: Negative.     Allergies  Bee venom and Other  Home Medications    LMP 03/12/2016 (Exact Date)   Physical Exam  Constitutional: She is oriented to person, place, and time. She appears well-developed.  HENT:  Both ear canals impacted with wax.  Neck: Normal range of motion.  Cardiovascular: Normal rate, regular rhythm, normal heart sounds and intact distal pulses.   Pulmonary/Chest: Effort normal and breath sounds normal.  Abdominal: Soft. Bowel sounds are normal.  Area of umbilical hernia repair intact no hernia noted,  Genitourinary: Uterus normal.  Musculoskeletal: Normal range of motion.  Neurological: She is alert and oriented to person, place, and time.  She has normal reflexes.  Skin: Skin is warm and dry.  Psychiatric: She has a normal mood and affect. Her behavior is normal. Judgment and thought content normal.    MAU Course  Procedures (including critical care time)  Labs Reviewed  URINALYSIS, ROUTINE W REFLEX MICROSCOPIC   No results found.   No diagnosis found.    MDM  VSS, FHR st and reg per doppler, umbilical hernia repair intact. Both ears impacted with wax. Pt instructed on how to irrigate gently to remove ear wax. zirtec for sinus drainage. Tylenol for headache. Will d/c home

## 2016-08-01 ENCOUNTER — Encounter: Payer: Self-pay | Admitting: Obstetrics and Gynecology

## 2016-08-01 ENCOUNTER — Ambulatory Visit (INDEPENDENT_AMBULATORY_CARE_PROVIDER_SITE_OTHER): Payer: Medicaid Other | Admitting: Obstetrics and Gynecology

## 2016-08-01 VITALS — BP 90/40 | HR 83 | Wt 141.4 lb

## 2016-08-01 DIAGNOSIS — Z3A20 20 weeks gestation of pregnancy: Secondary | ICD-10-CM | POA: Diagnosis not present

## 2016-08-01 DIAGNOSIS — O0992 Supervision of high risk pregnancy, unspecified, second trimester: Secondary | ICD-10-CM

## 2016-08-01 DIAGNOSIS — Z1389 Encounter for screening for other disorder: Secondary | ICD-10-CM | POA: Diagnosis not present

## 2016-08-01 DIAGNOSIS — Z331 Pregnant state, incidental: Secondary | ICD-10-CM | POA: Diagnosis not present

## 2016-08-01 DIAGNOSIS — O368920 Maternal care for other specified fetal problems, second trimester, not applicable or unspecified: Secondary | ICD-10-CM | POA: Diagnosis not present

## 2016-08-01 DIAGNOSIS — O09892 Supervision of other high risk pregnancies, second trimester: Secondary | ICD-10-CM

## 2016-08-01 LAB — POCT URINALYSIS DIPSTICK
Glucose, UA: NEGATIVE
KETONES UA: NEGATIVE
Nitrite, UA: NEGATIVE
PROTEIN UA: NEGATIVE
RBC UA: NEGATIVE

## 2016-08-01 NOTE — Progress Notes (Signed)
Patient ID: Lori Johnston, female   DOB: 08-Jul-1990, 26 y.o.   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  Urinalysis: Positive for 2+ Leukocytes otherwise negative.  HPI:  She notes that she has her [place] completed at [facility] and that is where she will deliver the baby due to Fetal Omphalocele. Pt reports at her last [place] they had a hard time finding the HR. Pt had normal genetic testing. She notes that she has an appointment on 8/13 at [facility] to trace the fetal heart rate. No other complaints at this time.    BP weight and urine results all reviewed and noted. Patient reports good fetal movement, denies any bleeding and no rupture of membranes symptoms or regular contractions.  Fundal Height: 19 cm Fetal Heart rate:  166 Edema:   Patient is without complaints. All questions were answered.  Assessment:  [redacted]w[redacted]d, Fetal Omphalocele   Medication(s) Plans:   Treatment Plan:  Follow up with [facility] appointments  Follow up in 4 weeks for OB appt,   By signing my name below, I, Margit Banda, attest that this documentation has been prepared under the direction and in the presence of Jonnie Kind, MD. Electronically Signed: Margit Banda, Medical Scribe. 08/01/16. 4:39 PM.  I personally performed the services described in this documentation, which was SCRIBED in my presence. The recorded information has been reviewed and considered accurate. It has been edited as necessary during review. Jonnie Kind, MD

## 2016-08-06 ENCOUNTER — Inpatient Hospital Stay (HOSPITAL_COMMUNITY)
Admission: AD | Admit: 2016-08-06 | Discharge: 2016-08-06 | Disposition: A | Discharge status: Home / Self Care | Payer: Medicaid Other | Source: Ambulatory Visit | Attending: Obstetrics & Gynecology | Admitting: Obstetrics & Gynecology

## 2016-08-06 DIAGNOSIS — R109 Unspecified abdominal pain: Secondary | ICD-10-CM | POA: Insufficient documentation

## 2016-08-06 DIAGNOSIS — R1110 Vomiting, unspecified: Secondary | ICD-10-CM | POA: Diagnosis present

## 2016-08-06 DIAGNOSIS — Z5321 Procedure and treatment not carried out due to patient leaving prior to being seen by health care provider: Secondary | ICD-10-CM | POA: Diagnosis not present

## 2016-08-06 LAB — URINALYSIS, ROUTINE W REFLEX MICROSCOPIC
BILIRUBIN URINE: NEGATIVE
GLUCOSE, UA: NEGATIVE mg/dL
HGB URINE DIPSTICK: NEGATIVE
KETONES UR: NEGATIVE mg/dL
Leukocytes, UA: NEGATIVE
Nitrite: NEGATIVE
PROTEIN: NEGATIVE mg/dL
Specific Gravity, Urine: 1.013 (ref 1.005–1.030)
pH: 8 (ref 5.0–8.0)

## 2016-08-06 NOTE — MAU Note (Signed)
Pt C/O vomiting & diarrhea since 2300, also having abdominal pain.  Also some lower back pain.  Denies bleeding, has white discharge.

## 2016-08-06 NOTE — Progress Notes (Signed)
Patient was called, but not in lobby at 0913.  Patient had stated she needed to be somewhere at 10am.  Will attempt to call patient again.

## 2016-08-10 ENCOUNTER — Encounter (HOSPITAL_COMMUNITY): Payer: Self-pay

## 2016-08-10 ENCOUNTER — Ambulatory Visit (HOSPITAL_COMMUNITY)
Admission: RE | Admit: 2016-08-10 | Discharge: 2016-08-10 | Disposition: A | Discharge status: Home / Self Care | Payer: Medicaid Other | Source: Ambulatory Visit | Attending: Obstetrics and Gynecology | Admitting: Obstetrics and Gynecology

## 2016-08-10 ENCOUNTER — Other Ambulatory Visit (HOSPITAL_COMMUNITY): Payer: Self-pay | Admitting: Maternal and Fetal Medicine

## 2016-08-10 DIAGNOSIS — Z3A21 21 weeks gestation of pregnancy: Secondary | ICD-10-CM | POA: Insufficient documentation

## 2016-08-10 DIAGNOSIS — Q792 Exomphalos: Secondary | ICD-10-CM

## 2016-08-10 DIAGNOSIS — Z3482 Encounter for supervision of other normal pregnancy, second trimester: Secondary | ICD-10-CM

## 2016-08-10 DIAGNOSIS — O359XX0 Maternal care for (suspected) fetal abnormality and damage, unspecified, not applicable or unspecified: Secondary | ICD-10-CM | POA: Insufficient documentation

## 2016-08-15 ENCOUNTER — Telehealth: Payer: Self-pay | Admitting: Obstetrics and Gynecology

## 2016-08-15 NOTE — Telephone Encounter (Signed)
Chart reviewed, pt beginning to have care at "[facility]" in [place].

## 2016-08-21 ENCOUNTER — Encounter (HOSPITAL_COMMUNITY): Payer: Self-pay | Admitting: Emergency Medicine

## 2016-08-21 ENCOUNTER — Inpatient Hospital Stay (HOSPITAL_COMMUNITY)
Admission: EM | Admit: 2016-08-21 | Discharge: 2016-08-21 | Disposition: A | Discharge status: Home / Self Care | Payer: Medicaid Other | Attending: Family Medicine | Admitting: Family Medicine

## 2016-08-21 DIAGNOSIS — O98212 Gonorrhea complicating pregnancy, second trimester: Secondary | ICD-10-CM | POA: Insufficient documentation

## 2016-08-21 DIAGNOSIS — O358XX0 Maternal care for other (suspected) fetal abnormality and damage, not applicable or unspecified: Secondary | ICD-10-CM | POA: Diagnosis not present

## 2016-08-21 DIAGNOSIS — Q792 Exomphalos: Secondary | ICD-10-CM | POA: Insufficient documentation

## 2016-08-21 DIAGNOSIS — R1032 Left lower quadrant pain: Secondary | ICD-10-CM | POA: Insufficient documentation

## 2016-08-21 DIAGNOSIS — O99352 Diseases of the nervous system complicating pregnancy, second trimester: Secondary | ICD-10-CM | POA: Diagnosis not present

## 2016-08-21 DIAGNOSIS — G43909 Migraine, unspecified, not intractable, without status migrainosus: Secondary | ICD-10-CM | POA: Insufficient documentation

## 2016-08-21 DIAGNOSIS — O9A212 Injury, poisoning and certain other consequences of external causes complicating pregnancy, second trimester: Secondary | ICD-10-CM | POA: Diagnosis present

## 2016-08-21 DIAGNOSIS — Z3A23 23 weeks gestation of pregnancy: Secondary | ICD-10-CM | POA: Diagnosis not present

## 2016-08-21 DIAGNOSIS — Z87891 Personal history of nicotine dependence: Secondary | ICD-10-CM | POA: Insufficient documentation

## 2016-08-21 DIAGNOSIS — T1490XA Injury, unspecified, initial encounter: Secondary | ICD-10-CM | POA: Diagnosis not present

## 2016-08-21 DIAGNOSIS — N898 Other specified noninflammatory disorders of vagina: Secondary | ICD-10-CM | POA: Diagnosis present

## 2016-08-21 LAB — CBC WITH DIFFERENTIAL/PLATELET
BASOS ABS: 0 10*3/uL (ref 0.0–0.1)
BASOS PCT: 0 %
Eosinophils Absolute: 0.1 10*3/uL (ref 0.0–0.7)
Eosinophils Relative: 0 %
HEMATOCRIT: 34.1 % — AB (ref 36.0–46.0)
HEMOGLOBIN: 11.3 g/dL — AB (ref 12.0–15.0)
LYMPHS PCT: 13 %
Lymphs Abs: 1.8 10*3/uL (ref 0.7–4.0)
MCH: 28.1 pg (ref 26.0–34.0)
MCHC: 33.1 g/dL (ref 30.0–36.0)
MCV: 84.8 fL (ref 78.0–100.0)
Monocytes Absolute: 0.9 10*3/uL (ref 0.1–1.0)
Monocytes Relative: 6 %
NEUTROS PCT: 81 %
Neutro Abs: 10.8 10*3/uL — ABNORMAL HIGH (ref 1.7–7.7)
Platelets: 212 10*3/uL (ref 150–400)
RBC: 4.02 MIL/uL (ref 3.87–5.11)
RDW: 14.2 % (ref 11.5–15.5)
WBC: 13.6 10*3/uL — AB (ref 4.0–10.5)

## 2016-08-21 LAB — FETAL FIBRONECTIN: FETAL FIBRONECTIN: POSITIVE — AB

## 2016-08-21 LAB — AMNISURE RUPTURE OF MEMBRANE (ROM) NOT AT ARMC: AMNISURE: NEGATIVE

## 2016-08-21 LAB — URINALYSIS, ROUTINE W REFLEX MICROSCOPIC
Bilirubin Urine: NEGATIVE
GLUCOSE, UA: NEGATIVE mg/dL
Hgb urine dipstick: NEGATIVE
Ketones, ur: 20 mg/dL — AB
LEUKOCYTES UA: NEGATIVE
NITRITE: NEGATIVE
PROTEIN: NEGATIVE mg/dL
Specific Gravity, Urine: 1.01 (ref 1.005–1.030)
pH: 7 (ref 5.0–8.0)

## 2016-08-21 LAB — ABO/RH: ABO/RH(D): O POS

## 2016-08-21 MED ORDER — MORPHINE SULFATE (PF) 4 MG/ML IV SOLN
4.0000 mg | Freq: Once | INTRAVENOUS | Status: AC
  Administered 2016-08-21: 4 mg via INTRAVENOUS
  Filled 2016-08-21 (×2): qty 1

## 2016-08-21 MED ORDER — ACETAMINOPHEN 325 MG PO TABS: 650.0000 mg | ORAL_TABLET | Freq: Once | ORAL | Status: DC

## 2016-08-21 MED ORDER — SODIUM CHLORIDE 0.9 % IV BOLUS (SEPSIS)
1000.0000 mL | Freq: Once | INTRAVENOUS | Status: AC
  Administered 2016-08-21: 1000 mL via INTRAVENOUS

## 2016-08-21 MED ORDER — ONDANSETRON HCL 4 MG/2ML IJ SOLN
4.0000 mg | Freq: Once | INTRAMUSCULAR | Status: DC
  Filled 2016-08-21: qty 2

## 2016-08-21 MED ORDER — DIPHENHYDRAMINE HCL 50 MG/ML IJ SOLN
25.0000 mg | Freq: Once | INTRAMUSCULAR | Status: DC
Start: 2016-08-21 — End: 2016-08-21

## 2016-08-21 NOTE — Discharge Instructions (Signed)
Preventing Injuries During Pregnancy °Trauma is the most common cause of injury and death in pregnant women. This can also result in serious harm to the baby or even death. °How can injuries affect my pregnancy? °Your baby is protected in the womb (uterus) by a sac filled with fluid (amniotic sac). Your baby can be harmed if there is a direct blow to your abdomen and pelvis. Trauma may be caused by: °· Falls. These are more common in the second and third trimester of pregnancy. °· Automobile accidents. °· Domestic violence or assault. °· Severe burns, such as from fire or electricity. ° °These injuries can result in: °· Tearing of your uterus. °· The placenta pulling away from the wall of the uterus (placental abruption). °· The amniotic sac breaking open (rupture of membranes). °· Blockage or decrease in the blood supply to your baby. °· Going into labor earlier than expected. °· Severe injuries to other parts of your body, such as your brain, spine, heart, lungs, or other organs. ° °Minor falls and low-impact automobile accidents do not usually harm your baby, even if they cause a little harm to you. °What can I do to lower my risk? °Safety °· Remove slippery rugs and loose objects on the floor. They increase your risk of tripping or slipping. °· Wear comfortable shoes that have a good grip on the sole. Do not wear high-heeled shoes. °· Always wear your seat belt properly when riding in a car. Use both the lap and shoulder belt, with the lap belt below your abdomen. Always practice safe driving. Do not ride on a motorcycle while pregnant. °Activity °· Avoid walking on wet or slippery floors. °· Do not participate in rough and violent activities or sports. °· Avoid high-risk situations and activities such as: °? Lifting heavy pots of boiling or hot liquids. °? Fixing electrical problems. °? Being near fires or starting fires. °General instructions °· Take over-the-counter and prescription medicines only as told by  your health care provider. °· Know your blood type and the father's blood type in case you develop vaginal bleeding or experience an injury for which a blood transfusion is needed. °· Spousal abuse can be a serious cause of trauma during pregnancy. If you are a victim of domestic violence or assault: °? Call your local emergency services (911 in the [place] °? Contact the National Domestic Violence Hotline for help and support. °When should I seek immediate medical care? °Get help right away if: °· You fall on your abdomen or experience any serious blow to your abdomen. °· You develop stiffness in your neck or pain after a fall or from other trauma. °· You develop a headache or vision problems after a fall or from other trauma. °· You do not feel the baby moving after a fall or trauma, or you feel that the baby is not moving as much as before the fall or trauma. °· You have been the victim of domestic violence or any other kind of physical attack. °· You have been in a car accident. °· You develop vaginal bleeding. °· You have fluid leaking from the vagina. °· You develop uterine contractions. Symptoms include pelvic cramping, pain, or serious low back pain. °· You become weak, faint, or have uncontrolled vomiting after trauma. °· You have a serious burn. This includes burns to the face, neck, hands, or genitals, or burns greater than the size of your palm anywhere else. ° °Summary °· Trauma is the most common cause of   injury and death in pregnant women and can also lead to injury or death of the baby. °· Falls, automobile accidents, domestic violence or assault, and severe burns can injure you or your baby. Make sure to get medical help right away if you experience any of these during your pregnancy. °· Take steps to prevent slips or falls in your home, such as avoiding slippery floors and removing loose rugs. °· Always wear your seat belt properly when riding in a car. Practice safe driving. °This information is  not intended to replace advice given to you by your health care provider. Make sure you discuss any questions you have with your health care provider. °Document Released: 01/26/2004 Document Revised: 12/28/2015 Document Reviewed: 12/28/2015 °Elsevier Interactive Patient Education © 2017 Elsevier Inc. ° °
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
## 2016-08-21 NOTE — ED Notes (Signed)
Erin, rapid response RN, at bedside.
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
## 2016-08-21 NOTE — ED Notes (Signed)
[facility] called. 
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
## 2016-08-29 ENCOUNTER — Encounter: Payer: Medicaid Other | Admitting: Advanced Practice Midwife

## 2016-09-06 ENCOUNTER — Ambulatory Visit (HOSPITAL_COMMUNITY): Payer: Medicaid Other

## 2016-09-11 ENCOUNTER — Encounter: Payer: Self-pay | Admitting: Advanced Practice Midwife

## 2016-09-11 ENCOUNTER — Ambulatory Visit (INDEPENDENT_AMBULATORY_CARE_PROVIDER_SITE_OTHER): Payer: Medicaid Other | Admitting: Advanced Practice Midwife

## 2016-09-11 VITALS — BP 90/50 | HR 80 | Wt 148.0 lb

## 2016-09-11 DIAGNOSIS — O0993 Supervision of high risk pregnancy, unspecified, third trimester: Secondary | ICD-10-CM

## 2016-09-11 DIAGNOSIS — O358XX0 Maternal care for other (suspected) fetal abnormality and damage, not applicable or unspecified: Secondary | ICD-10-CM | POA: Diagnosis not present

## 2016-09-11 DIAGNOSIS — Z3A26 26 weeks gestation of pregnancy: Secondary | ICD-10-CM | POA: Diagnosis not present

## 2016-09-11 DIAGNOSIS — Z1389 Encounter for screening for other disorder: Secondary | ICD-10-CM

## 2016-09-11 DIAGNOSIS — O1213 Gestational proteinuria, third trimester: Secondary | ICD-10-CM

## 2016-09-11 DIAGNOSIS — Z331 Pregnant state, incidental: Secondary | ICD-10-CM | POA: Diagnosis not present

## 2016-09-11 LAB — POCT URINALYSIS DIPSTICK
GLUCOSE UA: NEGATIVE
Leukocytes, UA: NEGATIVE
Nitrite, UA: NEGATIVE
Protein, UA: 1
RBC UA: NEGATIVE

## 2016-09-11 NOTE — Progress Notes (Addendum)
   [id number] [redacted]w[redacted]d Estimated Date of Delivery: 12/17/16  Blood pressure (!) 90/50, pulse 80, weight 148 lb (67.1 kg), last menstrual period 03/12/2016.   BP weight and urine results all reviewed and noted. No UTI sx Please refer to the obstetrical flow sheet for the fundal height and fetal heart rate documentation:  Patient reports good fetal movement, denies any bleeding and no rupture of membranes symptoms or regular contractions.  Known Omphalacele, consulted w/[facility], but pt confused aobut [facility] Patient hasn't been to [facility] for [facility] because she was confused about what to do. Tried to contact [employer] to reschedule appt she missed, but phone number won't go through. Didn't know AC was for fetal echo.  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 Phone number was wrong in EPIC, so no one could call her.  . All questions were answered.  Orders Placed This Encounter  Procedures  . Urine Culture  . POCT urinalysis dipstick    Plan:  Given address to [facility] [address].  Will ask them if she can continue joint PNC d/t 1 hour drive.  Will let us know Return for HROB, PN2.

## 2016-09-12 DIAGNOSIS — Z8751 Personal history of pre-term labor: Secondary | ICD-10-CM | POA: Insufficient documentation

## 2016-09-13 LAB — URINE CULTURE

## 2016-09-20 ENCOUNTER — Other Ambulatory Visit: Payer: Medicaid Other

## 2016-09-25 ENCOUNTER — Encounter: Payer: Self-pay | Admitting: Obstetrics & Gynecology

## 2016-09-25 ENCOUNTER — Other Ambulatory Visit (HOSPITAL_COMMUNITY): Payer: Self-pay | Admitting: *Deleted

## 2016-09-25 ENCOUNTER — Ambulatory Visit (INDEPENDENT_AMBULATORY_CARE_PROVIDER_SITE_OTHER): Payer: Medicaid Other | Admitting: Obstetrics & Gynecology

## 2016-09-25 VITALS — BP 100/60 | HR 82 | Wt 154.0 lb

## 2016-09-25 DIAGNOSIS — Z331 Pregnant state, incidental: Secondary | ICD-10-CM | POA: Diagnosis not present

## 2016-09-25 DIAGNOSIS — O403XX0 Polyhydramnios, third trimester, not applicable or unspecified: Secondary | ICD-10-CM | POA: Diagnosis not present

## 2016-09-25 DIAGNOSIS — O358XX0 Maternal care for other (suspected) fetal abnormality and damage, not applicable or unspecified: Secondary | ICD-10-CM | POA: Diagnosis not present

## 2016-09-25 DIAGNOSIS — O0993 Supervision of high risk pregnancy, unspecified, third trimester: Secondary | ICD-10-CM

## 2016-09-25 DIAGNOSIS — O35FXX0 Maternal care for other (suspected) fetal abnormality and damage, fetal musculoskeletal anomalies of trunk, not applicable or unspecified: Secondary | ICD-10-CM

## 2016-09-25 DIAGNOSIS — Z3A28 28 weeks gestation of pregnancy: Secondary | ICD-10-CM

## 2016-09-25 DIAGNOSIS — Z1389 Encounter for screening for other disorder: Secondary | ICD-10-CM | POA: Diagnosis not present

## 2016-09-25 LAB — POCT URINALYSIS DIPSTICK
Glucose, UA: NEGATIVE
LEUKOCYTES UA: NEGATIVE
NITRITE UA: NEGATIVE
RBC UA: NEGATIVE

## 2016-09-25 NOTE — Progress Notes (Signed)
Fetal Surveillance Testing today:  FHR 160   High Risk Pregnancy Diagnosis(es):   Omphaolocoele with polyhydramnios  G3P1102 [redacted]w[redacted]d Estimated Date of Delivery: 12/17/16  Blood pressure 100/60, pulse 82, weight 154 lb (69.9 kg), last menstrual period 03/12/2016.  Urinalysis: Negative   HPI: The patient is being seen today for ongoing management of as above. Today she reports some pelvic pressure   BP weight and urine results all reviewed and noted. Patient reports good fetal movement, denies any bleeding and no rupture of membranes symptoms or regular contractions.  Fundal Height:  33 Fetal Heart rate:  160 Edema:  none  Patient is without complaints other than noted in her HPI. All questions were answered.  All lab and sonogram results have been reviewed. Comments:    Assessment:  1.  Pregnancy at [redacted]w[redacted]d,  Estimated Date of Delivery: 12/17/16 :                          2.  Large Omphalocoele with liver and stomach                        3.  Polyhydramnios, mild with total AFI normal SDP 8.34 cm,  due to omphalocoele  Medication(s) Plans:  PNV  Treatment Plan: Care everywhere notes were reviewed. Twice weekly [facility], would be willing to do the testing here as it is more convenient for the patient, and If MFM agrees with the management plan No Follow-up on file. for appointment for high risk OB care  No orders of the defined types were placed in this encounter.  Orders Placed This Encounter  Procedures  . POCT urinalysis dipstick

## 2016-10-01 ENCOUNTER — Encounter (HOSPITAL_COMMUNITY): Payer: Self-pay

## 2016-10-01 ENCOUNTER — Telehealth: Payer: Self-pay | Admitting: Obstetrics & Gynecology

## 2016-10-01 ENCOUNTER — Ambulatory Visit (HOSPITAL_COMMUNITY)
Admission: RE | Admit: 2016-10-01 | Discharge: 2016-10-01 | Disposition: A | Discharge status: Home / Self Care | Payer: Medicaid Other | Source: Ambulatory Visit | Attending: Obstetrics and Gynecology | Admitting: Obstetrics and Gynecology

## 2016-10-01 NOTE — Telephone Encounter (Signed)
Patient called stating that she needs a letter for her job stating that she needs to stay out. Pt would like a call back. Please contact pt

## 2016-10-01 NOTE — Telephone Encounter (Signed)
Pt called stating that she needed a note for work. She states that her manager informed her that d/t her pregnancy complications, she is not able to work at her current job. Manager states that if she provides a doctors note then she will be able to return to her job after the pregnancy has ended. Advised pt to keep her appt with provider tomorrow and they could discuss this. Pt verbalized understanding.

## 2016-10-02 ENCOUNTER — Encounter: Payer: Medicaid Other | Admitting: Obstetrics & Gynecology

## 2016-10-08 ENCOUNTER — Ambulatory Visit (HOSPITAL_COMMUNITY): Admission: RE | Admit: 2016-10-08 | Payer: Medicaid Other | Source: Ambulatory Visit

## 2016-10-09 ENCOUNTER — Telehealth: Payer: Self-pay | Admitting: *Deleted

## 2016-10-09 NOTE — Telephone Encounter (Signed)
Patient called with complaints of sharp pain below belly button and above pubic bone. Has been coming and going for a few days now. States she has the pain when she "pushes on her lower belly". Pain with intercourse as well. No bleeding or abnormal discharge. Baby is moving, no cramping. Wants a pelvic exam today. Please advise.  Also informed patient she missed her last appointment but for some reason has no other appointments scheduled with [place]. She is seeing MFM in [place] but in the sticky note says care transferred. She is still supposed to come here for [facility] care, correct?

## 2016-10-09 NOTE — Telephone Encounter (Signed)
Just reassure patient sounds like musculoskeletal pain and I think MFM decided they would see her for all her visits

## 2016-10-10 ENCOUNTER — Telehealth: Payer: Self-pay | Admitting: *Deleted

## 2016-10-10 NOTE — Telephone Encounter (Signed)
Informed patient that per Dr Elonda Husky, pain sounded like musculoskeletal pain which is not uncommon during pregnancy. Also encouraged patient to find out at her appointment tomorrow if MFM is indeed following her for any needs she may during her pregnancy and if not to give [place] a call to get her scheduled for an appointment with [place]. Pt verbalized understanding.

## 2016-11-16 DIAGNOSIS — Z8719 Personal history of other diseases of the digestive system: Secondary | ICD-10-CM | POA: Insufficient documentation

## 2016-11-16 DIAGNOSIS — Z9889 Other specified postprocedural states: Secondary | ICD-10-CM

## 2016-11-22 ENCOUNTER — Emergency Department (HOSPITAL_COMMUNITY)
Admission: EM | Admit: 2016-11-22 | Discharge: 2016-11-22 | Disposition: A | Discharge status: Home / Self Care | Payer: Medicaid Other | Attending: Emergency Medicine | Admitting: Emergency Medicine

## 2016-11-22 ENCOUNTER — Other Ambulatory Visit: Payer: Self-pay

## 2016-11-22 DIAGNOSIS — Z87891 Personal history of nicotine dependence: Secondary | ICD-10-CM | POA: Insufficient documentation

## 2016-11-22 DIAGNOSIS — Z79899 Other long term (current) drug therapy: Secondary | ICD-10-CM | POA: Diagnosis not present

## 2016-11-22 DIAGNOSIS — R55 Syncope and collapse: Secondary | ICD-10-CM | POA: Diagnosis not present

## 2016-11-22 DIAGNOSIS — R42 Dizziness and giddiness: Secondary | ICD-10-CM | POA: Diagnosis present

## 2016-11-22 LAB — I-STAT CHEM 8, ED
BUN: <3 mg/dL — ABNORMAL LOW (ref 6–20)
Calcium, Ion: 1.16 mmol/L (ref 1.15–1.40)
Chloride: 101 mmol/L (ref 101–111)
Creatinine, Ser: 0.4 mg/dL — ABNORMAL LOW (ref 0.44–1.00)
GLUCOSE: 98 mg/dL (ref 65–99)
HEMATOCRIT: 31 % — AB (ref 36.0–46.0)
HEMOGLOBIN: 10.5 g/dL — AB (ref 12.0–15.0)
POTASSIUM: 3.7 mmol/L (ref 3.5–5.1)
SODIUM: 136 mmol/L (ref 135–145)
TCO2: 25 mmol/L (ref 22–32)
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
## 2016-11-22 NOTE — Discharge Instructions (Signed)
Follow-up with your OB/GYN doctor as scheduled, return to the emergency room as needed for recurrent symptoms, make sure to stay well-hydrated and to eat regularly

## 2016-11-22 NOTE — ED Triage Notes (Signed)
Pt states was driving down road to go eat lunch at [employer] and she states got dizzy after looking at sun and then started getting hot and sweating. Pt denies other symptoms.

## 2016-11-22 NOTE — ED Notes (Addendum)
Per pt Pt high risk pregnancy due to child having organs growing outside her belly.

## 2016-11-22 NOTE — ED Triage Notes (Signed)
Pt had two teeth pulled with sutures on 11-19-16 and was given amoxicillin and hydrocodone. Pt states she took first pain med today since procedure due to her eating breakfast and pulled a stitch out causing pain.

## 2016-11-28 ENCOUNTER — Inpatient Hospital Stay (HOSPITAL_COMMUNITY)
Admission: AD | Admit: 2016-11-28 | Discharge: 2016-11-29 | Disposition: A | Discharge status: Other Acute Inpatient Hospital | Payer: Medicaid Other | Source: Ambulatory Visit | Attending: Obstetrics & Gynecology | Admitting: Obstetrics & Gynecology

## 2016-11-28 ENCOUNTER — Encounter (HOSPITAL_COMMUNITY): Payer: Self-pay

## 2016-11-28 DIAGNOSIS — O479 False labor, unspecified: Secondary | ICD-10-CM

## 2016-11-28 DIAGNOSIS — Z818 Family history of other mental and behavioral disorders: Secondary | ICD-10-CM | POA: Diagnosis not present

## 2016-11-28 DIAGNOSIS — Z8249 Family history of ischemic heart disease and other diseases of the circulatory system: Secondary | ICD-10-CM | POA: Diagnosis not present

## 2016-11-28 DIAGNOSIS — Z822 Family history of deafness and hearing loss: Secondary | ICD-10-CM | POA: Diagnosis not present

## 2016-11-28 DIAGNOSIS — Z87891 Personal history of nicotine dependence: Secondary | ICD-10-CM | POA: Insufficient documentation

## 2016-11-28 DIAGNOSIS — Z9889 Other specified postprocedural states: Secondary | ICD-10-CM | POA: Insufficient documentation

## 2016-11-28 DIAGNOSIS — Z8619 Personal history of other infectious and parasitic diseases: Secondary | ICD-10-CM | POA: Insufficient documentation

## 2016-11-28 DIAGNOSIS — O471 False labor at or after 37 completed weeks of gestation: Secondary | ICD-10-CM

## 2016-11-28 DIAGNOSIS — O35FXX0 Maternal care for other (suspected) fetal abnormality and damage, fetal musculoskeletal anomalies of trunk, not applicable or unspecified: Secondary | ICD-10-CM

## 2016-11-28 DIAGNOSIS — Q792 Exomphalos: Secondary | ICD-10-CM | POA: Diagnosis not present

## 2016-11-28 DIAGNOSIS — Z87440 Personal history of urinary (tract) infections: Secondary | ICD-10-CM | POA: Insufficient documentation

## 2016-11-28 DIAGNOSIS — Z833 Family history of diabetes mellitus: Secondary | ICD-10-CM | POA: Diagnosis not present

## 2016-11-28 DIAGNOSIS — N898 Other specified noninflammatory disorders of vagina: Secondary | ICD-10-CM | POA: Diagnosis present

## 2016-11-28 DIAGNOSIS — O358XX0 Maternal care for other (suspected) fetal abnormality and damage, not applicable or unspecified: Secondary | ICD-10-CM | POA: Diagnosis not present

## 2016-11-28 DIAGNOSIS — Z3A37 37 weeks gestation of pregnancy: Secondary | ICD-10-CM | POA: Diagnosis not present

## 2016-11-28 DIAGNOSIS — G43909 Migraine, unspecified, not intractable, without status migrainosus: Secondary | ICD-10-CM | POA: Insufficient documentation

## 2016-11-28 DIAGNOSIS — O99353 Diseases of the nervous system complicating pregnancy, third trimester: Secondary | ICD-10-CM | POA: Insufficient documentation

## 2016-11-28 LAB — POCT FERN TEST: POCT FERN TEST: POSITIVE

## 2016-11-28 LAB — WET PREP, GENITAL
SPERM: NONE SEEN
Trich, Wet Prep: NONE SEEN
YEAST WET PREP: NONE SEEN

## 2016-11-28 LAB — AMNISURE RUPTURE OF MEMBRANE (ROM) NOT AT ARMC: Amnisure ROM: NEGATIVE

## 2016-11-28 NOTE — Progress Notes (Addendum)
G3P2 @ 37.[redacted] wksga. Presents to triage for r/o SROM. States started leaking at about 1700 clear  Denies bleeding. + FM.   EFM applied  SVE: 5/70/-1  Current pregn complicated by omphalocele and has been seen by MFM and recently switch over to [facility]. Pt desires a vaginal delivery and stated dr. Glo Herring informed there's no reason for her to have one. However, [facility] recommends Primary Cesarean section.   Fern test +  2110: Provider notified. Report status of pt given. No orders received.   Provider in unit. Aware Maryann Alar positive  2139: provider at bs for speculum exam. No pooling but thin discharge noted. Wet prep and amnisure done along with another fern test that was positive.  2202: Provider at bs for SVE.    Transfer to [facility] per MD order initiated.   SVE 4/70/-1   2210: saline lock placed 18 g left hand  2245: Mark Rexton transporter called to unit: Partial report given but states will call back.   2255Elta Guadeloupe from critical care transport called back .full report given. ETA midnight.

## 2016-11-28 NOTE — MAU Provider Note (Signed)
History  CSN: [id number] Arrival date and time: 11/28/16 2029   Chief Complaint  Patient presents with  . Rupture of Membranes    HPI: Lori Johnston is a 26 y.o. [phone] with IUP at [redacted]w[redacted]d, and pregnancy complicated by fetal large omphalocele who presents to maternity admissions reporting concern for leakage of fluid. She reports leaking of small amount of fluid around 4pm today. She later noted loss of large mucus plug. She reports some additioanl trickling of fluid since then, but no large gush. Report irregular contractions only. Reports good fetal movement.    Also denies any abnormal vaginal discharge, vaginal bleeding, fevers, chills, malaise, dysuria, hematuria, urinary frequency, nausea, vomiting, diarrhea, RUQ/epigastric pain, dizziness/lighreadhess, or headache.   She receives [facility] (care transferred around [redacted] week GA d/t large omphalocele (containing liver, stomach, intestines and noted ascites). Pregnancy also complicated by history of PTL. She currently has C/S scheduled at 39 weeks. Although patient reports she would like to have a vaginal birth, and that is why she came here tonight.    OB History  Gravida Para Term Preterm AB Living  3 2 1 1  0 2  SAB TAB Ectopic Multiple Live Births  0 0 0 0 2    # Outcome Date GA Lbr Len/2nd Weight Sex Delivery Anes PTL Lv  3 Current           2 Preterm 03/18/12 [redacted]w[redacted]d 02:00 / 00:33 6 lb 8.1 oz (2.951 kg) M Vag-Spont Local Y LIV     Complications: Retained placenta     Birth Comments: WNL  1 Term 05/22/11 [redacted]w[redacted]d 05:56 / 00:48 7 lb 14.8 oz (3.595 kg) M Vag-Spont EPI N LIV     Birth Comments: WNL      Past Medical History:  Diagnosis Date  . Abnormal uterine bleeding (AUB) 05/28/2014  . Bronchitis   . BV (bacterial vaginosis) 12/10/2012  . Contraceptive education 07/16/2013  . Contraceptive management 04/21/2015  . Decreased appetite 05/28/2014  . Dyspareunia 12/10/2012  . Gonorrhea affecting pregnancy in first trimester  05/04/2016   Treated in ER 5/1  . Hematuria 07/06/2015  . History of chlamydia   . History of PID 07/16/2013  . Irregular intermenstrual bleeding 07/06/2015  . Irregular periods 01/18/2015  . Migraine   . Nausea 01/18/2015  . Nexplanon in place 07/06/2014  . Nexplanon insertion 08/27/2013  . Pain with urination 07/06/2015  . PID (pelvic inflammatory disease)   . RUQ pain 07/16/2013  . Strain of rectus abdominis muscle 06/10/2014  . Umbilical hernia 0/01/270  . UTI (lower urinary tract infection)   . Vaginal discharge 12/10/2012  . Weight loss 07/06/2014  . Wheezing on expiration 12/09/2014  . Yeast infection 07/16/2013   Past Surgical History:  Procedure Laterality Date  . DILATION AND CURETTAGE OF UTERUS N/A 03/18/2012   Procedure: DILATATION AND CURETTAGE;  Surgeon: Mora Bellman, MD;  Location: [facility];  Service: Gynecology;  Laterality: N/A;  . INSERTION OF MESH N/A 08/18/2014   Procedure: INSERTION OF MESH;  Surgeon: [name], MD;  Location: [facility];  Service: General;  Laterality: N/A;  . tooth pulled    . UMBILICAL HERNIA REPAIR N/A 08/18/2014   Procedure: UMBILICAL HERNIORRHAPHY;  Surgeon: [name], MD;  Location: [facility];  Service: General;  Laterality: N/A;  . WISDOM TOOTH EXTRACTION     Family History  Problem Relation Age of Onset  . Diabetes Cousin   . Ulcers Cousin   . Deafness Paternal Grandmother   .  Migraines Paternal Grandmother   . Migraines Mother   . Migraines Sister   . Migraines Sister   . Heart attack Other        maternal great great grandma  . Mental illness Maternal Aunt   . Anesthesia problems Neg Hx   . Hypotension Neg Hx   . Malignant hyperthermia Neg Hx   . Pseudochol deficiency Neg Hx   . Colon cancer Neg Hx    Social History   Socioeconomic History  . Marital status: Single    Spouse name: Not on file  . Number of children: Not on file  . Years of education: Not on file  . Highest education level: Not on file  Social Needs  . Financial  resource strain: Not on file  . Food insecurity - worry: Not on file  . Food insecurity - inability: Not on file  . Transportation needs - medical: Not on file  . Transportation needs - non-medical: Not on file  Occupational History  . Not on file  Tobacco Use  . Smoking status: Former Smoker    Packs/day: 0.20    Years: 2.00    Pack years: 0.40    Types: Cigarettes    Last attempt to quit: 08/12/2010    Years since quitting: 6.3  . Smokeless tobacco: Never Used  Substance and Sexual Activity  . Alcohol use: No    Alcohol/week: 0.6 oz    Types: 1 Glasses of wine per week    Comment: rarely (holidays and family events)  . Drug use: No  . Sexual activity: Yes    Birth control/protection: None    Comment: hx chlamydia during 1st preg; was treated  Other Topics Concern  . Not on file  Social History Narrative  . Not on file   Allergies  Allergen Reactions  . Bee Venom Anaphylaxis  . Other Anaphylaxis, Swelling and Rash    Tomatoes.    Medications Prior to Admission  Medication Sig Dispense Refill Last Dose  . amoxicillin (AMOXIL) 500 MG capsule Take 500 mg by mouth 3 (three) times daily. 7 day course starting on 11/19/2016   11/22/2016 at Unknown time  . Biotin w/ Vitamins C & E (HAIR SKIN & NAILS GUMMIES) 1250-7.5-7.5 MCG-MG-UNT CHEW Chew by mouth.   11/22/2016 at Unknown time  . calcium carbonate (TUMS - DOSED IN MG ELEMENTAL CALCIUM) 500 MG chewable tablet Chew 1 tablet by mouth daily as needed for heartburn.    unknown  . HYDROcodone-acetaminophen (NORCO/VICODIN) 5-325 MG tablet Take 1 tablet by mouth every 8 (eight) hours as needed for moderate pain.    11/22/2016 at 1030a  . Prenatal Vit-Fe Fumarate-FA (PRENATAL MULTIVITAMIN) TABS tablet Take 1 tablet by mouth daily at 12 noon.   11/22/2016 at Unknown time    I have reviewed patient's Past Medical Hx, Surgical Hx, Family Hx, Social Hx, medications and allergies.   Review of Systems: Negative except for what is  mentioned in HPI.  Physical Exam   Blood pressure 108/66, pulse 88, temperature 98.2 F (36.8 C), temperature source Oral, resp. rate 18, height 5\' 8"  (1.727 m), weight 161 lb (73 kg), last menstrual period 03/12/2016.  Constitutional: Well-developed, well-nourished female in no acute distress.  HENT: [place]/AT, normal oropharynx mucosa. MMM Eyes: normal conjunctivae, no scleral icterus Cardiovascular: normal rate, regular rhythm Respiratory: normal effort, lungs CTAB.  GI: Abd soft, non-tender, gravid appropriate for gestational age.   GU: Neg CVAT. Pelvic: NEFG. Normal vaginal mucosa without lesions. Cervix  pink, visually closed, without lesion, scant discharge w/o pooling of fluid SVE: 4cm/70%/-2 MSK: Extremities nontender, no edema Neurologic: Alert and oriented x 4. Psych: Normal mood and affect Skin: warm and dry   FHT:  Baseline 145 , moderate variability, accelerations present, no decelerations Toco: irregular ctx about ~6-8 min  MAU Course/MDM:   Nursing notes and VS reviewed. Patient seen and examined, as noted above.  React NST  Results reviewed:  Results for orders placed or performed during the hospital encounter of 11/28/16  Wet prep, genital  Result Value Ref Range   Yeast Wet Prep HPF POC NONE SEEN NONE SEEN   Trich, Wet Prep NONE SEEN NONE SEEN   Clue Cells Wet Prep HPF POC PRESENT (A) NONE SEEN   WBC, Wet Prep HPF POC MANY (A) NONE SEEN   Sperm NONE SEEN   Amnisure rupture of membrane (rom)not at [facility]  Result Value Ref Range   Amnisure ROM NEGATIVE   Fern Test  Result Value Ref Range   POCT Fern Test Positive = ruptured amniotic membanes     - R/o ROM -- positive ferning on RN ferning. ?ferning on my evaluation. Amnisure  - R/o Labor -- SVE 4/70%/-1 may be in early latent labor, but not in active labor - Reviewed records from [facility], which has recommendation that patient needs to deliver at [facility] due to large omphalocele.  Discussed with  attending physician, Dr. Elonda Husky.who recommends transfer to [facility]. Pt safe for transport.  Called and spoke with Dr [place] who has accepted patient.   Assessment and Plan  Assessment: 1. Fetal omphalocele during pregnancy, antepartum, single or unspecified fetus   2. Uterine contractions during pregnancy     Plan: --Transfer to [facility] in stable condition   Osha Rane, Jenne Pane, MD 11/28/2016 10:44 PM

## 2016-11-28 NOTE — MAU Note (Signed)
Urine in lab 

## 2016-12-01 DIAGNOSIS — Q792 Exomphalos: Secondary | ICD-10-CM

## 2016-12-04 ENCOUNTER — Telehealth: Payer: Self-pay | Admitting: *Deleted

## 2016-12-04 NOTE — Telephone Encounter (Signed)
Patient called requesting pain medication since she had a c/section. Stated she delivered at [place] and was only given tylenol and ibuprofen for pain at discharge. Advised patient to call them for pain medication since they were the ones following her at the end of her pregnancy and did her c/s. Pt stated she would.

## 2016-12-06 ENCOUNTER — Encounter: Payer: Self-pay | Admitting: Obstetrics and Gynecology

## 2016-12-06 ENCOUNTER — Ambulatory Visit (INDEPENDENT_AMBULATORY_CARE_PROVIDER_SITE_OTHER): Payer: Medicaid Other | Admitting: Obstetrics and Gynecology

## 2016-12-06 VITALS — BP 120/68 | HR 65 | Ht 68.0 in | Wt 155.8 lb

## 2016-12-06 DIAGNOSIS — Z98891 History of uterine scar from previous surgery: Secondary | ICD-10-CM

## 2016-12-06 DIAGNOSIS — Z09 Encounter for follow-up examination after completed treatment for conditions other than malignant neoplasm: Secondary | ICD-10-CM

## 2016-12-06 MED ORDER — TRAMADOL HCL 50 MG PO TABS: 50.0000 mg | ORAL_TABLET | Freq: Four times a day (QID) | ORAL | 0 refills | Status: DC | PRN

## 2016-12-06 NOTE — Progress Notes (Addendum)
   Subjective:  Lori Johnston is a 26 y.o. female now 5 days status post C[facility] fetal [facility]phalocele.  The baby will not have surgery until some additional skin has developed from the baby at present there is not enough to close over the abdomen contents. .  She wants an IUD for long term contraception (5 years).   Review of Systems Negative except possible slight allergic reaction "from epidural. She noted itching from head to toe when it was applied. She has noticed random red patches popping up. She has noticed abdominal pain and soreness if she isn't wearing her abdominal wrap, and lower back pain during the night, that radiates into her buttocks. She hasn't filled her ibuprofen RX.    Diet:   normal   Bowel movements : normal.  Pain is not well controlled.   Objective:  BP 120/68 (BP Location: Right Arm, Patient Position: Sitting, Cuff Size: Normal)   Pulse 65   Ht 5\' 8"  (1.727 m)   Wt 155 lb 12.8 oz (70.7 kg)   LMP 03/12/2016 (Exact Date)   Breastfeeding? Yes   BMI 23.69 kg/m  General:Well developed, well nourished.  No acute distress. Abdomen: Bowel sounds normal, soft, non-tender. Pelvic Exam: Not indicated Incision(s):   Healing well, no drainage, no erythema, no hernia, no swelling, no dehiscence,     Assessment:  Post-Op 5 days s/p C-section    Doing well postoperatively.  Moderate pain needs   Plan:  1.Wound care discussed 2. . current medications: Rx Tramadol,50 mg x 15 tabs and patient advised to restart taking her ibuprofen routinely 3. Activity restrictions: no lifting more than 25 pounds, 4. return to work: 4 weeks. 5. Follow up in 4 weeks.  For postpartum visit, also 4 weeks for IUD insertion    By signing my name below, I, Izna Ahmed, attest that this documentation has been prepared under the direction and in the presence of Jonnie Kind, MD. Electronically Signed: Jabier Gauss, Medical Scribe. 12/06/16. 2:23 PM.  I  personally performed the services described in this documentation, which was SCRIBED in my presence. The recorded information has been reviewed and considered accurate. It has been edited as necessary during review. Jonnie Kind, MD

## 2016-12-19 ENCOUNTER — Ambulatory Visit (INDEPENDENT_AMBULATORY_CARE_PROVIDER_SITE_OTHER): Payer: Medicaid Other | Admitting: Obstetrics and Gynecology

## 2016-12-19 ENCOUNTER — Encounter: Payer: Self-pay | Admitting: Obstetrics and Gynecology

## 2016-12-19 ENCOUNTER — Other Ambulatory Visit: Payer: Self-pay

## 2016-12-19 VITALS — BP 110/72 | HR 82 | Ht 68.0 in | Wt 142.0 lb

## 2016-12-19 DIAGNOSIS — Z3400 Encounter for supervision of normal first pregnancy, unspecified trimester: Secondary | ICD-10-CM

## 2016-12-19 DIAGNOSIS — Z5329 Procedure and treatment not carried out because of patient's decision for other reasons: Secondary | ICD-10-CM

## 2016-12-19 NOTE — Progress Notes (Signed)
° °  Subjective:  Lori Johnston is a 26 y.o. female now 3 weeks status post C/S. Pt's baby is still in the hospital due to fetal omphalocele. They are waiting for her to come off the ventilator. She still wants an IUD for long term contraception.  Review of Systems Negative    Diet:   normal   Bowel movements : normal.  The patient is not having any pain.  Objective:  BP 110/72 (BP Location: Right Arm, Patient Position: Sitting, Cuff Size: Normal)    Pulse 82    Ht 5\' 8"  (1.727 m)    Wt 142 lb (64.4 kg)    BMI 21.59 kg/m  General:Well developed, well nourished.  No acute distress. Abdomen: Bowel sounds normal, soft, non-tender. Pelvic Exam: Not indicated  Incision(s):   Healing well, no drainage, no erythema, no hernia, no swelling, no dehiscence,   Assessment:  Post-Op 3 weeks s/p C/S    Doing well postoperatively.   Plan:  1.Wound care discussed  2. . current medications. 3. Activity restrictions: no lifting more than 25 pounds 4. return to work: 1-2 weeks. 5. Follow up as scheduled on 01/09 for IUD insertion  By signing my name below, I, Izna Ahmed, attest that this documentation has been prepared under the direction and in the presence of Jonnie Kind, MD. Electronically Signed: Jabier Gauss, Medical Scribe. 12/19/16. 3:28 PM.  I personally performed the services described in this documentation, which was SCRIBED in my presence. The recorded information has been reviewed and considered accurate. It has been edited as necessary during review. Jonnie Kind, MD

## 2016-12-19 NOTE — Assessment & Plan Note (Signed)
Fetal omphalocele delivery at [facility].

## 2016-12-19 NOTE — Progress Notes (Signed)
  Subjective:  Lori Johnston is a 26 y.o. female now 3 weeks status post C/S. Pt's baby is still in the hospital due to fetal omphalocele. They are waiting for her to come off the ventilator. She still wants an IUD for long term contraception.  Review of Systems Negative    Diet:   normal   Bowel movements : normal.  The patient is not having any pain.  Objective:  BP 110/72 (BP Location: Right Arm, Patient Position: Sitting, Cuff Size: Normal)   Pulse 82   Ht 5\' 8"  (1.727 m)   Wt 142 lb (64.4 kg)   BMI 21.59 kg/m  General:Well developed, well nourished.  No acute distress. Abdomen: Bowel sounds normal, soft, non-tender. Pelvic Exam: Not indicated  Incision(s):   Healing well, no drainage, no erythema, no hernia, no swelling, no dehiscence,   Assessment:  Post-Op 3 weeks s/p C/S    Doing well postoperatively.   Plan:  1.Wound care discussed  2. . current medications. 3. Activity restrictions: no lifting more than 25 pounds 4. return to work: 1-2 weeks. 5. Follow up as scheduled on 01/09 for IUD insertion  By signing my name below, I, Izna Ahmed, attest that this documentation has been prepared under the direction and in the presence of Jonnie Kind, MD. Electronically Signed: Jabier Gauss, Medical Scribe. 12/19/16. 3:28 PM.  I personally performed the services described in this documentation, which was SCRIBED in my presence. The recorded information has been reviewed and considered accurate. It has been edited as necessary during review. Jonnie Kind, MD

## 2016-12-25 DIAGNOSIS — Z91199 Patient's noncompliance with other medical treatment and regimen due to unspecified reason: Secondary | ICD-10-CM | POA: Insufficient documentation

## 2016-12-25 DIAGNOSIS — Z5329 Procedure and treatment not carried out because of patient's decision for other reasons: Secondary | ICD-10-CM | POA: Insufficient documentation

## 2017-01-09 ENCOUNTER — Ambulatory Visit: Payer: Medicaid Other | Admitting: Advanced Practice Midwife

## 2017-01-10 ENCOUNTER — Ambulatory Visit: Payer: Medicaid Other | Admitting: Advanced Practice Midwife

## 2017-01-10 ENCOUNTER — Other Ambulatory Visit: Payer: Medicaid Other

## 2017-01-10 NOTE — Progress Notes (Signed)
Pt had unprotected sex 8 days ago. wil check hcg

## 2017-02-05 ENCOUNTER — Encounter: Payer: Self-pay | Admitting: *Deleted

## 2017-03-20 ENCOUNTER — Ambulatory Visit (HOSPITAL_COMMUNITY)
Admission: EM | Admit: 2017-03-20 | Discharge: 2017-03-20 | Disposition: A | Discharge status: Home / Self Care | Payer: Medicaid Other | Attending: Family Medicine | Admitting: Family Medicine

## 2017-03-20 ENCOUNTER — Other Ambulatory Visit: Payer: Self-pay

## 2017-03-20 ENCOUNTER — Encounter (HOSPITAL_COMMUNITY): Payer: Self-pay | Admitting: Emergency Medicine

## 2017-03-20 DIAGNOSIS — K0889 Other specified disorders of teeth and supporting structures: Secondary | ICD-10-CM | POA: Diagnosis not present

## 2017-03-20 DIAGNOSIS — Z3202 Encounter for pregnancy test, result negative: Secondary | ICD-10-CM | POA: Diagnosis not present

## 2017-03-20 LAB — POCT PREGNANCY, URINE: PREG TEST UR: NEGATIVE

## 2017-03-20 MED ORDER — ACETAMINOPHEN 325 MG PO TABS: 650.0000 mg | ORAL_TABLET | Freq: Four times a day (QID) | ORAL | 0 refills | Status: AC | PRN

## 2017-03-20 MED ORDER — IBUPROFEN 800 MG PO TABS: 800.0000 mg | ORAL_TABLET | Freq: Three times a day (TID) | ORAL | 0 refills | Status: AC

## 2017-03-20 NOTE — Discharge Instructions (Signed)
Pregnancy test was negative.  Please use dental resource to contact offices to seek permenant treatment/relief.   For pain please take 600mg -800mg  of Ibuprofen every 8 hours, take with 1000 mg of Tylenol Extra strength every 8 hours. These are safe to take together. Please take with food.   Please return if you start to experience significant swelling of your face, experiencing fever.
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
## 2017-03-20 NOTE — ED Triage Notes (Addendum)
Pt here for a pregnancy test and left lower dental pain.

## 2017-05-06 ENCOUNTER — Encounter (HOSPITAL_COMMUNITY): Payer: Self-pay | Admitting: Emergency Medicine

## 2017-05-06 ENCOUNTER — Emergency Department (HOSPITAL_COMMUNITY)
Admission: EM | Admit: 2017-05-06 | Discharge: 2017-05-06 | Disposition: A | Discharge status: Home / Self Care | Payer: Medicaid Other | Attending: Emergency Medicine | Admitting: Emergency Medicine

## 2017-05-06 ENCOUNTER — Other Ambulatory Visit: Payer: Self-pay

## 2017-05-06 DIAGNOSIS — Z87891 Personal history of nicotine dependence: Secondary | ICD-10-CM | POA: Diagnosis not present

## 2017-05-06 DIAGNOSIS — R197 Diarrhea, unspecified: Secondary | ICD-10-CM | POA: Diagnosis not present

## 2017-05-06 DIAGNOSIS — R112 Nausea with vomiting, unspecified: Secondary | ICD-10-CM | POA: Insufficient documentation

## 2017-05-06 DIAGNOSIS — R1013 Epigastric pain: Secondary | ICD-10-CM | POA: Insufficient documentation

## 2017-05-06 DIAGNOSIS — R109 Unspecified abdominal pain: Secondary | ICD-10-CM

## 2017-05-06 LAB — COMPREHENSIVE METABOLIC PANEL
ALK PHOS: 48 U/L (ref 38–126)
ALT: 21 U/L (ref 14–54)
ANION GAP: 7 (ref 5–15)
AST: 26 U/L (ref 15–41)
Albumin: 4.4 g/dL (ref 3.5–5.0)
BILIRUBIN TOTAL: 0.5 mg/dL (ref 0.3–1.2)
BUN: 16 mg/dL (ref 6–20)
CALCIUM: 8.8 mg/dL — AB (ref 8.9–10.3)
CO2: 23 mmol/L (ref 22–32)
CREATININE: 0.64 mg/dL (ref 0.44–1.00)
Chloride: 105 mmol/L (ref 101–111)
GFR calc non Af Amer: >60 mL/min (ref >60)
Glucose, Bld: 87 mg/dL (ref 65–99)
Potassium: 4.2 mmol/L (ref 3.5–5.1)
Sodium: 135 mmol/L (ref 135–145)
TOTAL PROTEIN: 7.3 g/dL (ref 6.5–8.1)

## 2017-05-06 LAB — URINALYSIS, ROUTINE W REFLEX MICROSCOPIC
BILIRUBIN URINE: NEGATIVE
Glucose, UA: NEGATIVE mg/dL
HGB URINE DIPSTICK: NEGATIVE
KETONES UR: NEGATIVE mg/dL
Leukocytes, UA: NEGATIVE
NITRITE: NEGATIVE
PH: 5 (ref 5.0–8.0)
Protein, ur: NEGATIVE mg/dL
SPECIFIC GRAVITY, URINE: 1.026 (ref 1.005–1.030)

## 2017-05-06 LAB — HCG, QUANTITATIVE, PREGNANCY: hCG, Beta Chain, Quant, S: 146 m[IU]/mL — ABNORMAL HIGH (ref <5)

## 2017-05-06 LAB — CBC
HCT: 40.1 % (ref 36.0–46.0)
HEMOGLOBIN: 12.8 g/dL (ref 12.0–15.0)
MCH: 26.8 pg (ref 26.0–34.0)
MCHC: 31.9 g/dL (ref 30.0–36.0)
MCV: 84.1 fL (ref 78.0–100.0)
PLATELETS: 227 10*3/uL (ref 150–400)
RBC: 4.77 MIL/uL (ref 3.87–5.11)
RDW: 14.7 % (ref 11.5–15.5)
WBC: 9.8 10*3/uL (ref 4.0–10.5)

## 2017-05-06 LAB — LIPASE, BLOOD: Lipase: 27 U/L (ref 11–51)

## 2017-05-06 MED ORDER — AMOXICILLIN 500 MG PO CAPS: 500.0000 mg | ORAL_CAPSULE | Freq: Three times a day (TID) | ORAL | 0 refills | Status: DC

## 2017-05-06 MED ORDER — FENTANYL CITRATE (PF) 100 MCG/2ML IJ SOLN
50.0000 ug | Freq: Once | INTRAMUSCULAR | Status: AC
  Administered 2017-05-06: 50 ug via INTRAVENOUS
  Filled 2017-05-06: qty 2

## 2017-05-06 MED ORDER — SODIUM CHLORIDE 0.9 % IV BOLUS
1000.0000 mL | Freq: Once | INTRAVENOUS | Status: AC
  Administered 2017-05-06: 1000 mL via INTRAVENOUS

## 2017-05-06 MED ORDER — ONDANSETRON HCL 4 MG/2ML IJ SOLN
4.0000 mg | Freq: Once | INTRAMUSCULAR | Status: AC
  Administered 2017-05-06: 4 mg via INTRAVENOUS
  Filled 2017-05-06: qty 2

## 2017-05-06 NOTE — Discharge Instructions (Addendum)
Your pregnancy test was barely positive.  This will need to be rechecked in 1 week.  Otherwise blood work was normal.  Prescription for antibiotic.  You can only take Tylenol because of the possibility of pregnancy.  Phone number for oral surgeon given.

## 2017-05-06 NOTE — ED Triage Notes (Signed)
Pt states abdominal pain with n/v/d at 7 am.
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
## 2017-05-06 NOTE — ED Notes (Signed)
Pt came out to nurses station requesting IV to be removed. States she lives in [place] and needs to pick up her son from school. EDP and primary RN notified. NT in room to remove IV.

## 2017-06-03 ENCOUNTER — Emergency Department (HOSPITAL_COMMUNITY): Payer: Medicaid Other

## 2017-06-03 ENCOUNTER — Other Ambulatory Visit: Payer: Self-pay

## 2017-06-03 ENCOUNTER — Emergency Department (HOSPITAL_COMMUNITY)
Admission: EM | Admit: 2017-06-03 | Discharge: 2017-06-03 | Disposition: A | Discharge status: Home / Self Care | Payer: Medicaid Other | Attending: Emergency Medicine | Admitting: Emergency Medicine

## 2017-06-03 ENCOUNTER — Encounter (HOSPITAL_COMMUNITY): Payer: Self-pay | Admitting: Emergency Medicine

## 2017-06-03 DIAGNOSIS — F17210 Nicotine dependence, cigarettes, uncomplicated: Secondary | ICD-10-CM | POA: Insufficient documentation

## 2017-06-03 DIAGNOSIS — O9989 Other specified diseases and conditions complicating pregnancy, childbirth and the puerperium: Secondary | ICD-10-CM

## 2017-06-03 DIAGNOSIS — Z3A08 8 weeks gestation of pregnancy: Secondary | ICD-10-CM | POA: Diagnosis not present

## 2017-06-03 DIAGNOSIS — O99331 Smoking (tobacco) complicating pregnancy, first trimester: Secondary | ICD-10-CM | POA: Insufficient documentation

## 2017-06-03 DIAGNOSIS — B3731 Acute candidiasis of vulva and vagina: Secondary | ICD-10-CM

## 2017-06-03 DIAGNOSIS — O2391 Unspecified genitourinary tract infection in pregnancy, first trimester: Secondary | ICD-10-CM | POA: Insufficient documentation

## 2017-06-03 DIAGNOSIS — B373 Candidiasis of vulva and vagina: Secondary | ICD-10-CM

## 2017-06-03 DIAGNOSIS — R8271 Bacteriuria: Secondary | ICD-10-CM

## 2017-06-03 LAB — COMPREHENSIVE METABOLIC PANEL
ALBUMIN: 3.5 g/dL (ref 3.5–5.0)
ALK PHOS: 44 U/L (ref 38–126)
ALT: 23 U/L (ref 14–54)
AST: 25 U/L (ref 15–41)
Anion gap: 7 (ref 5–15)
BILIRUBIN TOTAL: 0.3 mg/dL (ref 0.3–1.2)
BUN: 7 mg/dL (ref 6–20)
CALCIUM: 8.7 mg/dL — AB (ref 8.9–10.3)
CO2: 25 mmol/L (ref 22–32)
Chloride: 102 mmol/L (ref 101–111)
Creatinine, Ser: 0.58 mg/dL (ref 0.44–1.00)
GFR calc Af Amer: >60 mL/min (ref >60)
GLUCOSE: 89 mg/dL (ref 65–99)
POTASSIUM: 3.7 mmol/L (ref 3.5–5.1)
Sodium: 134 mmol/L — ABNORMAL LOW (ref 135–145)
TOTAL PROTEIN: 5.9 g/dL — AB (ref 6.5–8.1)

## 2017-06-03 LAB — URINALYSIS, ROUTINE W REFLEX MICROSCOPIC
Bilirubin Urine: NEGATIVE
GLUCOSE, UA: NEGATIVE mg/dL
HGB URINE DIPSTICK: NEGATIVE
Ketones, ur: NEGATIVE mg/dL
NITRITE: NEGATIVE
PH: 5 (ref 5.0–8.0)
PROTEIN: NEGATIVE mg/dL
SPECIFIC GRAVITY, URINE: 1.026 (ref 1.005–1.030)

## 2017-06-03 LAB — I-STAT BETA HCG BLOOD, ED (MC, WL, AP ONLY): I-stat hCG, quantitative: >2000 m[IU]/mL — ABNORMAL HIGH (ref <5)

## 2017-06-03 LAB — CBC
HCT: 34 % — ABNORMAL LOW (ref 36.0–46.0)
Hemoglobin: 10.9 g/dL — ABNORMAL LOW (ref 12.0–15.0)
MCH: 26.7 pg (ref 26.0–34.0)
MCHC: 32.1 g/dL (ref 30.0–36.0)
MCV: 83.1 fL (ref 78.0–100.0)
Platelets: 250 10*3/uL (ref 150–400)
RBC: 4.09 MIL/uL (ref 3.87–5.11)
RDW: 14.9 % (ref 11.5–15.5)
WBC: 8.6 10*3/uL (ref 4.0–10.5)

## 2017-06-03 LAB — WET PREP, GENITAL
Clue Cells Wet Prep HPF POC: NONE SEEN
SPERM: NONE SEEN
Trich, Wet Prep: NONE SEEN

## 2017-06-03 LAB — LIPASE, BLOOD: Lipase: 29 U/L (ref 11–51)

## 2017-06-03 LAB — GC/CHLAMYDIA PROBE AMP (~~LOC~~) NOT AT ARMC
Chlamydia: NEGATIVE
Neisseria Gonorrhea: NEGATIVE

## 2017-06-03 MED ORDER — CLOTRIMAZOLE 1 % VA CREA: 1.0000 | TOPICAL_CREAM | Freq: Every day | VAGINAL | 0 refills | Status: DC

## 2017-06-03 MED ORDER — CEPHALEXIN 500 MG PO CAPS: 500.0000 mg | ORAL_CAPSULE | Freq: Two times a day (BID) | ORAL | 0 refills | Status: DC

## 2017-06-03 NOTE — Discharge Instructions (Addendum)
You are approximately [redacted] weeks pregnant.  Only thing you can take for pain is Tylenol.  Have given you a cream for a yeast infection.  Have also given antibiotics for a urinary tract infection.  Start taking prenatal vitamins.  Make sure you follow-up with your primary care and OB/GYN doctor.  It is very important for you to follow-up with an OB/GYN doctor soon.  Return to the ED with any worsening symptoms.
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
## 2017-06-03 NOTE — ED Triage Notes (Signed)
C/o intermittent L sided abd pain and nausea x 3 days.  Reports LMP 04/11/17.

## 2017-06-03 NOTE — ED Notes (Signed)
Patient transported to Ultrasound 

## 2017-06-04 LAB — URINE CULTURE

## 2017-06-30 ENCOUNTER — Other Ambulatory Visit: Payer: Self-pay

## 2017-06-30 ENCOUNTER — Inpatient Hospital Stay (HOSPITAL_COMMUNITY)
Admission: AD | Admit: 2017-06-30 | Discharge: 2017-06-30 | Disposition: A | Discharge status: Home / Self Care | Payer: Medicaid Other | Source: Ambulatory Visit | Attending: Obstetrics and Gynecology | Admitting: Obstetrics and Gynecology

## 2017-06-30 ENCOUNTER — Encounter: Payer: Self-pay | Admitting: Advanced Practice Midwife

## 2017-06-30 ENCOUNTER — Inpatient Hospital Stay (HOSPITAL_COMMUNITY): Payer: Medicaid Other

## 2017-06-30 DIAGNOSIS — O99332 Smoking (tobacco) complicating pregnancy, second trimester: Secondary | ICD-10-CM | POA: Insufficient documentation

## 2017-06-30 DIAGNOSIS — O26891 Other specified pregnancy related conditions, first trimester: Secondary | ICD-10-CM

## 2017-06-30 DIAGNOSIS — O209 Hemorrhage in early pregnancy, unspecified: Secondary | ICD-10-CM | POA: Diagnosis present

## 2017-06-30 DIAGNOSIS — F17210 Nicotine dependence, cigarettes, uncomplicated: Secondary | ICD-10-CM | POA: Insufficient documentation

## 2017-06-30 DIAGNOSIS — Z3A11 11 weeks gestation of pregnancy: Secondary | ICD-10-CM | POA: Diagnosis not present

## 2017-06-30 DIAGNOSIS — O034 Incomplete spontaneous abortion without complication: Secondary | ICD-10-CM | POA: Insufficient documentation

## 2017-06-30 DIAGNOSIS — Z79899 Other long term (current) drug therapy: Secondary | ICD-10-CM | POA: Insufficient documentation

## 2017-06-30 DIAGNOSIS — R109 Unspecified abdominal pain: Secondary | ICD-10-CM

## 2017-06-30 LAB — CBC
HCT: 34.6 % — ABNORMAL LOW (ref 36.0–46.0)
Hemoglobin: 11.8 g/dL — ABNORMAL LOW (ref 12.0–15.0)
MCH: 27.9 pg (ref 26.0–34.0)
MCHC: 34.1 g/dL (ref 30.0–36.0)
MCV: 81.8 fL (ref 78.0–100.0)
Platelets: 233 10*3/uL (ref 150–400)
RBC: 4.23 MIL/uL (ref 3.87–5.11)
RDW: 16 % — ABNORMAL HIGH (ref 11.5–15.5)
WBC: 14.3 10*3/uL — ABNORMAL HIGH (ref 4.0–10.5)

## 2017-06-30 LAB — WET PREP, GENITAL
CLUE CELLS WET PREP: NONE SEEN
Sperm: NONE SEEN
TRICH WET PREP: NONE SEEN
YEAST WET PREP: NONE SEEN

## 2017-06-30 LAB — HCG, QUANTITATIVE, PREGNANCY: HCG, BETA CHAIN, QUANT, S: 4416 m[IU]/mL — AB (ref <5)

## 2017-06-30 MED ORDER — FENTANYL CITRATE (PF) 100 MCG/2ML IJ SOLN
100.0000 ug | Freq: Once | INTRAMUSCULAR | Status: AC
  Administered 2017-06-30: 100 ug via INTRAVENOUS
  Filled 2017-06-30: qty 2

## 2017-06-30 MED ORDER — MISOPROSTOL 200 MCG PO TABS: ORAL_TABLET | ORAL | 0 refills | Status: DC

## 2017-06-30 MED ORDER — SODIUM CHLORIDE 0.9 % IV BOLUS
1000.0000 mL | Freq: Once | INTRAVENOUS | Status: AC
  Administered 2017-06-30: 1000 mL via INTRAVENOUS

## 2017-06-30 MED ORDER — TRAMADOL HCL 50 MG PO TABS: 50.0000 mg | ORAL_TABLET | Freq: Once | ORAL | Status: DC

## 2017-06-30 MED ORDER — TRAMADOL HCL 50 MG PO TABS
100.0000 mg | ORAL_TABLET | Freq: Once | ORAL | Status: AC
  Administered 2017-06-30: 100 mg via ORAL
  Filled 2017-06-30: qty 2

## 2017-06-30 MED ORDER — TRAMADOL HCL 50 MG PO TABS: 50.0000 mg | ORAL_TABLET | Freq: Four times a day (QID) | ORAL | 0 refills | Status: DC | PRN

## 2017-06-30 MED ORDER — ONDANSETRON HCL 4 MG/2ML IJ SOLN
4.0000 mg | Freq: Once | INTRAMUSCULAR | Status: AC
  Administered 2017-06-30: 4 mg via INTRAVENOUS
  Filled 2017-06-30: qty 2

## 2017-06-30 MED ORDER — KETOROLAC TROMETHAMINE 30 MG/ML IJ SOLN
30.0000 mg | Freq: Once | INTRAMUSCULAR | Status: AC
  Administered 2017-06-30: 30 mg via INTRAVENOUS
  Filled 2017-06-30: qty 1

## 2017-06-30 MED ORDER — MISOPROSTOL 200 MCG PO TABS
800.0000 ug | ORAL_TABLET | Freq: Once | ORAL | Status: AC
  Administered 2017-06-30: 800 ug via BUCCAL
  Filled 2017-06-30: qty 4

## 2017-06-30 NOTE — Discharge Instructions (Signed)

## 2017-06-30 NOTE — MAU Provider Note (Signed)
Chief Complaint: Vaginal Bleeding   First Provider Initiated Contact with Patient 06/30/17 0256     SUBJECTIVE HPI: Lori Johnston is a 27 y.o. [phone] at [redacted]w[redacted]d who presents to Maternity Admissions reporting vaginal bleeding and abdominal cramping.  Had ultrasound at [facility], ED on June 03, 2017 showing 8-week 1 day live IUP.  Vaginal Bleeding: Moderate-heavy and passing clots since this afternoon.  Unsure if she has passed tissue Dizziness: Denies Abdominal pain: Cramping since 9 PM  O POS  Pain Location: Low abdomen, pelvis, low back radiating down bilateral thighs Quality: Cramping Severity: 10/10 on pain scale Duration: ~6 hours Course: Worsening Context: 11.[redacted] weeks pregnant Timing: Intermittent Modifying factors: Nothing.  Has not tried anything for the pain Associated signs and symptoms: Positive for vaginal bleeding.  Negative for fever, chills, urinary complaints.  Denied nausea and vomiting, but vomited once soon after arrival.  Past Medical History:  Diagnosis Date  . Abnormal uterine bleeding (AUB) 05/28/2014  . Bronchitis   . BV (bacterial vaginosis) 12/10/2012  . Contraceptive education 07/16/2013  . Contraceptive management 04/21/2015  . Decreased appetite 05/28/2014  . Dyspareunia 12/10/2012  . Gonorrhea affecting pregnancy in first trimester 05/04/2016   Treated in ER 5/1  . Hematuria 07/06/2015  . History of chlamydia   . History of PID 07/16/2013  . Irregular intermenstrual bleeding 07/06/2015  . Irregular periods 01/18/2015  . Migraine   . Nausea 01/18/2015  . Nexplanon in place 07/06/2014  . Nexplanon insertion 08/27/2013  . Pain with urination 07/06/2015  . PID (pelvic inflammatory disease)   . RUQ pain 07/16/2013  . Strain of rectus abdominis muscle 06/10/2014  . Umbilical hernia 07/06/1605  . UTI (lower urinary tract infection)   . Vaginal discharge 12/10/2012  . Weight loss 07/06/2014  . Wheezing on expiration 12/09/2014  . Yeast infection 07/16/2013   OB History   Gravida Para Term Preterm AB Living  4 3 2 1  0 3  SAB TAB Ectopic Multiple Live Births  0 0 0 0 3    # Outcome Date GA Lbr Len/2nd Weight Sex Delivery Anes PTL Lv  4 Current           3 Term 12/01/16 [redacted]w[redacted]d   F CS-LTranv   LIV     Complications: Omphalocele  2 Preterm 03/18/12 [redacted]w[redacted]d 02:00 / 00:33 6 lb 8.1 oz (2.951 kg) M Vag-Spont Local Y LIV     Birth Comments: WNL     Complications: Retained placenta  1 Term 05/22/11 [redacted]w[redacted]d 05:56 / 00:48 7 lb 14.8 oz (3.595 kg) M Vag-Spont EPI N LIV     Birth Comments: WNL    Past Surgical History:  Procedure Laterality Date  . CESAREAN SECTION  12/01/2016  . DILATION AND CURETTAGE OF UTERUS N/A 03/18/2012   Procedure: DILATATION AND CURETTAGE;  Surgeon: Mora Bellman, MD;  Location: [facility];  Service: Gynecology;  Laterality: N/A;  . INSERTION OF MESH N/A 08/18/2014   Procedure: INSERTION OF MESH;  Surgeon: [name], MD;  Location: [facility];  Service: General;  Laterality: N/A;  . tooth pulled    . UMBILICAL HERNIA REPAIR N/A 08/18/2014   Procedure: UMBILICAL HERNIORRHAPHY;  Surgeon: [name], MD;  Location: [facility];  Service: General;  Laterality: N/A;  . WISDOM TOOTH EXTRACTION     Social History   Socioeconomic History  . Marital status: Single    Spouse name: Not on file  . Number of children: Not on file  . Years of  education: Not on file  . Highest education level: Not on file  Occupational History  . Not on file  Social Needs  . Financial resource strain: Not on file  . Food insecurity:    Worry: Not on file    Inability: Not on file  . Transportation needs:    Medical: Not on file    Non-medical: Not on file  Tobacco Use  . Smoking status: Current Every Day Smoker    Packs/day: 0.20    Years: 2.00    Pack years: 0.40    Types: Cigarettes    Last attempt to quit: 01/02/2016    Years since quitting: 1.4  . Smokeless tobacco: Never Used  Substance and Sexual Activity  . Alcohol use: Not Currently    Alcohol/week: 0.6 oz     Types: 1 Glasses of wine per week    Comment: rarely (holidays and family events)  . Drug use: Yes    Types: Marijuana    Comment: Last use 06/30/17  . Sexual activity: Yes    Birth control/protection: None    Comment: hx chlamydia during 1st preg; was treated  Lifestyle  . Physical activity:    Days per week: Not on file    Minutes per session: Not on file  . Stress: Not on file  Relationships  . Social connections:    Talks on phone: Not on file    Gets together: Not on file    Attends religious service: Not on file    Active member of club or organization: Not on file    Attends meetings of clubs or organizations: Not on file    Relationship status: Not on file  . Intimate partner violence:    Fear of current or ex partner: Not on file    Emotionally abused: Not on file    Physically abused: Not on file    Forced sexual activity: Not on file  Other Topics Concern  . Not on file  Social History Narrative  . Not on file   No current facility-administered medications on file prior to encounter.    Current Outpatient Medications on File Prior to Encounter  Medication Sig Dispense Refill  . Prenatal Vit-Fe Fumarate-FA (PRENATAL MULTIVITAMIN) TABS tablet Take 1 tablet by mouth daily at 12 noon.    Marland Kitchen amoxicillin (AMOXIL) 500 MG capsule Take 1 capsule (500 mg total) by mouth 3 (three) times daily. (Patient not taking: Reported on 06/03/2017) 30 capsule 0  . cephALEXin (KEFLEX) 500 MG capsule Take 1 capsule (500 mg total) by mouth 2 (two) times daily. 10 capsule 0  . clotrimazole (GYNE-LOTRIMIN) 1 % vaginal cream Place 1 Applicatorful vaginally at bedtime. 45 g 0  . traMADol (ULTRAM) 50 MG tablet Take 1 tablet (50 mg total) by mouth every 6 (six) hours as needed for moderate pain or severe pain. (Patient not taking: Reported on 06/03/2017) 30 tablet 0   Allergies  Allergen Reactions  . Bee Venom Anaphylaxis  . Other Anaphylaxis, Swelling and Rash    Tomatoes.    I have  reviewed the past Medical Hx, Surgical Hx, Social Hx, Allergies and Medications.   Review of Systems  Constitutional: Negative for chills and fever.  Gastrointestinal: Positive for abdominal pain, rectal pain and vomiting. Negative for constipation, diarrhea and nausea.  Genitourinary: Positive for vaginal bleeding. Negative for dysuria and vaginal discharge.  Musculoskeletal: Positive for back pain.  Neurological: Negative for dizziness.    OBJECTIVE Patient Vitals for the past  24 hrs:  BP Temp Temp src Pulse Resp  06/30/17 0420 (!) 112/53 - - 70 18  06/30/17 0407 111/65 - - 69 18  06/30/17 0240 (!) 127/59 98.7 F (37.1 C) Oral 79 18   Constitutional: Well-developed, well-nourished female in severe distress.  Tearful. Cardiovascular: normal rate Respiratory: normal rate and effort.  GI: Abd soft, mildly tender.  Fundus 1 over SP.  Well-healed low transverse scar. MS: Extremities nontender, no edema, normal ROM Neurologic: Alert and oriented x 4.  GU:  SPECULUM EXAM: NEFG, moderate amount of bright red blood and tissue noted in vaginal vault and coming through cervix. cervix clean, visually dilated 2 cm with tissue visible.  Unable to remove all tissue from office.  BIMANUAL: cervix 2 cm; uterus 10-week, no adnexal tenderness or masses. No CMT.  LAB RESULTS Results for orders placed or performed during the hospital encounter of 06/30/17 (from the past 24 hour(s))  hCG, quantitative, pregnancy     Status: Abnormal   Collection Time: 06/30/17  3:15 AM  Result Value Ref Range   hCG, Beta Chain, Quant, S 4,416 (H) <5 mIU/mL  CBC     Status: Abnormal   Collection Time: 06/30/17  3:15 AM  Result Value Ref Range   WBC 14.3 (H) 4.0 - 10.5 K/uL   RBC 4.23 3.87 - 5.11 MIL/uL   Hemoglobin 11.8 (L) 12.0 - 15.0 g/dL   HCT 34.6 (L) 36.0 - 46.0 %   MCV 81.8 78.0 - 100.0 fL   MCH 27.9 26.0 - 34.0 pg   MCHC 34.1 30.0 - 36.0 g/dL   RDW 16.0 (H) 11.5 - 15.5 %   Platelets 233 150 - 400 K/uL   Wet prep, genital     Status: Abnormal   Collection Time: 06/30/17  3:28 AM  Result Value Ref Range   Yeast Wet Prep HPF POC NONE SEEN NONE SEEN   Trich, Wet Prep NONE SEEN NONE SEEN   Clue Cells Wet Prep HPF POC NONE SEEN NONE SEEN   WBC, Wet Prep HPF POC FEW (A) NONE SEEN   Sperm NONE SEEN     IMAGING US Ob Comp Less 14 Wks  Result Date: 06/30/2017 CLINICAL DATA:  27 year old female with heavy vaginal bleeding and abdominal pain. Concern for miscarriage. EXAM: OBSTETRIC <14 WK [place] AND TRANSVAGINAL OB US TECHNIQUE: Both transabdominal and transvaginal ultrasound examinations were performed for complete evaluation of the gestation as well as the maternal uterus, adnexal regions, and pelvic cul-de-sac. Transvaginal technique was performed to assess early pregnancy. COMPARISON:  Obstetrical ultrasound dated 06/03/2017 FINDINGS: The uterus is anteverted and slightly heterogeneous. The endometrium is thickened and contains heterogeneous and echogenic content measuring 2.4 cm in thickness. No intrauterine pregnancy identified on today's study consistent with interval miscarriage of the previously seen intrauterine pregnancy. Doppler images demonstrate areas of vascularity and flow to the fundal portion of the endometrial content concerning for retained products of conception. Correlation with clinical exam and HCG levels recommended. Maternal uterus/adnexae: The ovaries are not visualized. No significant free fluid within the pelvis. IMPRESSION: 1. Interval miscarriage of the previously seen IUP. 2. Echogenic content within the endometrium with areas of vascularity concerning for retained products of conception. Clinical correlation is recommended. Electronically Signed   By: Anner Crete M.D.   On: 06/30/2017 04:13   MAU COURSE Orders Placed This Encounter  Procedures  . Wet prep, genital  . US OB Transvaginal  . US OB Comp Less 14 Wks  . hCG, quantitative,  pregnancy  . CBC  . HIV antibody  (routine testing) (NOT for [facility])  . Diet NPO time specified  . Vital signs  . Insert peripheral IV   Meds ordered this encounter  Medications  . sodium chloride 0.9 % bolus 1,000 mL  . fentaNYL (SUBLIMAZE) injection 100 mcg  . ondansetron (ZOFRAN) injection 4 mg  . misoprostol (CYTOTEC) tablet 800 mcg  . ketorolac (TORADOL) 30 MG/ML injection 30 mg   Pain improved w/ Fentanyl. Pt comfortable-appearing, but requesting something else to help w/ low back pain. Toradol ordered. Explained that emptying her uterus will also help w/ the pain. Discussed Hx, labs, exam w/ Dr. Elly Modena. Agrees w/ POC to give Cytotec in MAU and Observe for 1-2 hours.   Light bleeding. VSS. Ultram given for pain.   MDM - Incomplete AB.  Bleeding moderate initially, but improving. Echogenic content within the endometrium with areas of vascularity concerning for retained products of conception--Tx'd w/ Cytotec.   ASSESSMENT 1. Incomplete miscarriage   2. Vaginal bleeding in pregnancy, first trimester   3. Abdominal pain during pregnancy in first trimester     PLAN Discharge home in stable condition. Bleeding precautions Support given Follow-up Information    [facility] Follow up in 2 week(s).   Why:  call to schedule an appointment in 2 weeks or sooner as needed if symptoms worsen.  Contact information: [place] [id number] [facility].   Why:  as needed in pregnancy emergencies Contact information: [address] [id number] [place] [phone]          Allergies as of 06/30/2017      Reactions   Bee Venom Anaphylaxis   Other Anaphylaxis, Swelling, Rash   Tomatoes.      Medication List    STOP taking these medications   amoxicillin 500 MG capsule Commonly known as:  AMOXIL   cephALEXin 500 MG capsule Commonly known as:  KEFLEX    clotrimazole 1 % vaginal cream Commonly known as:  GYNE-LOTRIMIN     TAKE these medications   misoprostol 200 MCG tablet Commonly known as:  CYTOTEC Place four tablets in between your gums and cheeks (two tablets on each side) 24 hours after first dose   prenatal multivitamin Tabs tablet Take 1 tablet by mouth daily at 12 noon.   traMADol 50 MG tablet Commonly known as:  ULTRAM Take 1 tablet (50 mg total) by mouth every 6 (six) hours as needed for moderate pain or severe pain.        Tamala Julian, [place], [place] 06/30/2017  4:21 AM  4

## 2017-06-30 NOTE — MAU Note (Signed)
Pt. Transported by EMS. Pt. States she been bleeding since this morning. Reports finding out she was pregnant on 06/03/17 and states she is now 12 weeks. Pt. Reports having clots. Reports pain in abd. As 10/10 intermittent and sharp/shocking. Last marijuana usage this morning.

## 2017-07-01 LAB — GC/CHLAMYDIA PROBE AMP (~~LOC~~) NOT AT ARMC
CHLAMYDIA, DNA PROBE: NEGATIVE
Neisseria Gonorrhea: NEGATIVE

## 2017-09-30 ENCOUNTER — Other Ambulatory Visit (HOSPITAL_COMMUNITY): Payer: Self-pay | Admitting: Family Medicine

## 2017-09-30 DIAGNOSIS — R1013 Epigastric pain: Secondary | ICD-10-CM

## 2017-10-08 ENCOUNTER — Ambulatory Visit: Payer: Medicaid Other | Admitting: Cardiology

## 2017-10-08 NOTE — Progress Notes (Deleted)
Clinical Summary Lori Johnston is a 27 y.o.female seen as new consult  1. Palpitations/syncope  2. Pregnancy?   Past Medical History:  Diagnosis Date  . Abnormal uterine bleeding (AUB) 05/28/2014  . Bronchitis   . BV (bacterial vaginosis) 12/10/2012  . Contraceptive education 07/16/2013  . Contraceptive management 04/21/2015  . Decreased appetite 05/28/2014  . Dyspareunia 12/10/2012  . Gonorrhea affecting pregnancy in first trimester 05/04/2016   Treated in ER 5/1  . Hematuria 07/06/2015  . History of chlamydia   . History of PID 07/16/2013  . Irregular intermenstrual bleeding 07/06/2015  . Irregular periods 01/18/2015  . Migraine   . Nausea 01/18/2015  . Nexplanon in place 07/06/2014  . Nexplanon insertion 08/27/2013  . Pain with urination 07/06/2015  . PID (pelvic inflammatory disease)   . RUQ pain 07/16/2013  . Strain of rectus abdominis muscle 06/10/2014  . Umbilical hernia 01/06/1094  . UTI (lower urinary tract infection)   . Vaginal discharge 12/10/2012  . Weight loss 07/06/2014  . Wheezing on expiration 12/09/2014  . Yeast infection 07/16/2013     Allergies  Allergen Reactions  . Bee Venom Anaphylaxis  . Other Anaphylaxis, Swelling and Rash    Tomatoes.     Current Outpatient Medications  Medication Sig Dispense Refill  . misoprostol (CYTOTEC) 200 MCG tablet Place four tablets in between your gums and cheeks (two tablets on each side) 24 hours after first dose 4 tablet 0  . Prenatal Vit-Fe Fumarate-FA (PRENATAL MULTIVITAMIN) TABS tablet Take 1 tablet by mouth daily at 12 noon.    . traMADol (ULTRAM) 50 MG tablet Take 1 tablet (50 mg total) by mouth every 6 (six) hours as needed for moderate pain or severe pain. 30 tablet 0   No current facility-administered medications for this visit.      Past Surgical History:  Procedure Laterality Date  . CESAREAN SECTION  12/01/2016  . DILATION AND CURETTAGE OF UTERUS N/A 03/18/2012   Procedure: DILATATION AND CURETTAGE;  Surgeon:  Mora Bellman, MD;  Location: [facility];  Service: Gynecology;  Laterality: N/A;  . INSERTION OF MESH N/A 08/18/2014   Procedure: INSERTION OF MESH;  Surgeon: [name], MD;  Location: [facility];  Service: General;  Laterality: N/A;  . tooth pulled    . UMBILICAL HERNIA REPAIR N/A 08/18/2014   Procedure: UMBILICAL HERNIORRHAPHY;  Surgeon: [name], MD;  Location: [facility];  Service: General;  Laterality: N/A;  . WISDOM TOOTH EXTRACTION       Allergies  Allergen Reactions  . Bee Venom Anaphylaxis  . Other Anaphylaxis, Swelling and Rash    Tomatoes.      Family History  Problem Relation Age of Onset  . Diabetes Cousin   . Ulcers Cousin   . Deafness Paternal Grandmother   . Migraines Paternal Grandmother   . Migraines Mother   . Migraines Sister   . Migraines Sister   . Heart attack Other        maternal great great grandma  . Mental illness Maternal Aunt   . Other Daughter        omphalecle  . Anesthesia problems Neg Hx   . Hypotension Neg Hx   . Malignant hyperthermia Neg Hx   . Pseudochol deficiency Neg Hx   . Colon cancer Neg Hx      Social History Lori Johnston reports that she has been smoking cigarettes. She has a 0.40 pack-year smoking history. She has never used smokeless tobacco. Lori Johnston  reports that she drank about 1.0 standard drinks of alcohol per week.   Review of Systems CONSTITUTIONAL: No weight loss, fever, chills, weakness or fatigue.  HEENT: Eyes: No visual loss, blurred vision, double vision or yellow sclerae.No hearing loss, sneezing, congestion, runny nose or sore throat.  SKIN: No rash or itching.  CARDIOVASCULAR:  RESPIRATORY: No shortness of breath, cough or sputum.  GASTROINTESTINAL: No anorexia, nausea, vomiting or diarrhea. No abdominal pain or blood.  GENITOURINARY: No burning on urination, no polyuria NEUROLOGICAL: No headache, dizziness, syncope, paralysis, ataxia, numbness or tingling in the extremities. No change in bowel or bladder  control.  MUSCULOSKELETAL: No muscle, back pain, joint pain or stiffness.  LYMPHATICS: No enlarged nodes. No history of splenectomy.  PSYCHIATRIC: No history of depression or anxiety.  ENDOCRINOLOGIC: No reports of sweating, cold or heat intolerance. No polyuria or polydipsia.  Marland Kitchen   Physical Examination There were no vitals filed for this visit. There were no vitals filed for this visit.  Gen: resting comfortably, no acute distress HEENT: no scleral icterus, pupils equal round and reactive, no palptable cervical adenopathy,  CV Resp: Clear to auscultation bilaterally GI: abdomen is soft, non-tender, non-distended, normal bowel sounds, no hepatosplenomegaly MSK: extremities are warm, no edema.  Skin: warm, no rash Neuro:  no focal deficits Psych: appropriate affect   Diagnostic Studies     Assessment and Plan        Arnoldo Lenis, M.D., F.A.C.C.

## 2017-10-09 ENCOUNTER — Encounter: Payer: Self-pay | Admitting: Cardiology

## 2017-10-27 ENCOUNTER — Other Ambulatory Visit: Payer: Self-pay

## 2017-10-27 ENCOUNTER — Emergency Department (HOSPITAL_COMMUNITY)
Admission: EM | Admit: 2017-10-27 | Discharge: 2017-10-27 | Disposition: A | Discharge status: Home / Self Care | Payer: Medicaid Other | Attending: Emergency Medicine | Admitting: Emergency Medicine

## 2017-10-27 ENCOUNTER — Encounter: Payer: Self-pay | Admitting: Emergency Medicine

## 2017-10-27 DIAGNOSIS — M5431 Sciatica, right side: Secondary | ICD-10-CM

## 2017-10-27 DIAGNOSIS — M79604 Pain in right leg: Secondary | ICD-10-CM | POA: Diagnosis present

## 2017-10-27 DIAGNOSIS — A599 Trichomoniasis, unspecified: Secondary | ICD-10-CM | POA: Insufficient documentation

## 2017-10-27 DIAGNOSIS — Z711 Person with feared health complaint in whom no diagnosis is made: Secondary | ICD-10-CM

## 2017-10-27 DIAGNOSIS — A5901 Trichomonal vulvovaginitis: Secondary | ICD-10-CM

## 2017-10-27 DIAGNOSIS — B9689 Other specified bacterial agents as the cause of diseases classified elsewhere: Secondary | ICD-10-CM

## 2017-10-27 DIAGNOSIS — N760 Acute vaginitis: Secondary | ICD-10-CM | POA: Diagnosis not present

## 2017-10-27 DIAGNOSIS — Z79899 Other long term (current) drug therapy: Secondary | ICD-10-CM | POA: Insufficient documentation

## 2017-10-27 DIAGNOSIS — F17210 Nicotine dependence, cigarettes, uncomplicated: Secondary | ICD-10-CM | POA: Insufficient documentation

## 2017-10-27 LAB — POC URINE PREG, ED: Preg Test, Ur: NEGATIVE

## 2017-10-27 LAB — URINALYSIS, ROUTINE W REFLEX MICROSCOPIC
BILIRUBIN URINE: NEGATIVE
Bacteria, UA: NONE SEEN
Glucose, UA: NEGATIVE mg/dL
Hgb urine dipstick: NEGATIVE
KETONES UR: NEGATIVE mg/dL
NITRITE: NEGATIVE
PH: 5 (ref 5.0–8.0)
Protein, ur: NEGATIVE mg/dL
SPECIFIC GRAVITY, URINE: 1.029 (ref 1.005–1.030)

## 2017-10-27 LAB — WET PREP, GENITAL
SPERM: NONE SEEN
Yeast Wet Prep HPF POC: NONE SEEN

## 2017-10-27 MED ORDER — CEFTRIAXONE SODIUM 250 MG IJ SOLR
250.0000 mg | Freq: Once | INTRAMUSCULAR | Status: AC
Start: 2017-10-27 — End: 2017-10-27
  Administered 2017-10-27: 250 mg via INTRAMUSCULAR
  Filled 2017-10-27: qty 250

## 2017-10-27 MED ORDER — METRONIDAZOLE 500 MG PO TABS: 500.0000 mg | ORAL_TABLET | Freq: Two times a day (BID) | ORAL | 0 refills | Status: DC

## 2017-10-27 MED ORDER — LIDOCAINE HCL (PF) 1 % IJ SOLN
2.0000 mL | Freq: Once | INTRAMUSCULAR | Status: AC
  Administered 2017-10-27: 2 mL via INTRADERMAL

## 2017-10-27 MED ORDER — IBUPROFEN 800 MG PO TABS
800.0000 mg | ORAL_TABLET | Freq: Once | ORAL | Status: AC
  Administered 2017-10-27: 800 mg via ORAL
  Filled 2017-10-27: qty 1

## 2017-10-27 MED ORDER — AZITHROMYCIN 250 MG PO TABS
1000.0000 mg | ORAL_TABLET | Freq: Once | ORAL | Status: AC
  Administered 2017-10-27: 1000 mg via ORAL
  Filled 2017-10-27: qty 4

## 2017-10-27 MED ORDER — MELOXICAM 7.5 MG PO TABS: 7.5000 mg | ORAL_TABLET | Freq: Every day | ORAL | 0 refills | Status: DC

## 2017-10-27 MED ORDER — LIDOCAINE HCL (PF) 1 % IJ SOLN
INTRAMUSCULAR | Status: AC
  Administered 2017-10-27: 2 mL via INTRADERMAL
  Filled 2017-10-27: qty 5

## 2017-10-27 MED ORDER — ONDANSETRON 4 MG PO TBDP
4.0000 mg | ORAL_TABLET | Freq: Once | ORAL | Status: AC
  Administered 2017-10-27: 4 mg via ORAL
  Filled 2017-10-27: qty 1

## 2017-10-27 MED ORDER — METRONIDAZOLE 500 MG PO TABS
2000.0000 mg | ORAL_TABLET | Freq: Once | ORAL | Status: AC
Start: 2017-10-27 — End: 2017-10-27
  Administered 2017-10-27: 2000 mg via ORAL
  Filled 2017-10-27: qty 4
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
## 2017-10-27 NOTE — ED Triage Notes (Signed)
Pt to ER for evaluation of right leg pain and lower back pain x "weeks" - reports intermittent episodes of right leg weakness and numbness resulting in falls. Denies loss of bowel/bladder. Patient reports when this happens, she has sensation in her leg, she feels the pain, but states is unable to move the leg.

## 2017-10-27 NOTE — ED Notes (Signed)
Pt reports left upper dental pain as well.

## 2017-10-27 NOTE — Discharge Instructions (Addendum)
You may have diarrhea from the antibiotics.  It is very important that you continue to take the antibiotics even if you get diarrhea unless a medical professional tells you that you may stop taking them.  If you stop too early the bacteria you are being treated for will become stronger and you may need different, more powerful antibiotics that have more side effects and worsening diarrhea.  Please stay well hydrated and consider probiotics as they may decrease the severity of your diarrhea.  Please be aware that if you take any hormonal contraception (birth control pills, nexplanon, the ring, etc) that your birth control will not work while you are taking antibiotics and you need to use back up protection as directed on the birth control medication information insert.   Please take Tylenol (acetaminophen) to relieve your pain.  You may take tylenol, up to 1,000 mg (two extra strength pills).  Do not take more than 3,000 mg tylenol in a 24 hour period.  Please check all medication labels as many medications such as pain and cold medications may contain tylenol. Please do not drink alcohol while taking this medication.   I have given you a prescription for Mobic (meloxicam) today.  Mobic is a NSAID medication and you should not take it with other NSAIDs.  Examples of other NSAIDS include motrin, ibuprofen, aleve, naproxen, and Voltaren.  Please monitor your bowel movements for dark, tarry, sticky stools. If you have any bowel movements like this you need to stop taking mobic and call your doctor as this may represent a stomach ulcer from taking NSAIDS.    You are being prescribed a medication which may make you sleepy. For 24 hours after one dose please do not drive, operate heavy machinery, care for a small child with out another adult present, or perform any activities that may cause harm to you or someone else if you were to fall asleep or be impaired.   Today your diagnosed with bacterial vaginosis and  received a prescription for metronidazole also known as Flagyl. It is very important that you do not consume any alcohol while taking this medication as it will cause you to become violently ill.  Today you have been treated for gonorrhea and chlamydia.  The test to determine if you have these will take a few days. They will only call you if your tests come back positive, no news is good news. In the result that your tests are positive you have already been treated.

## 2017-10-29 LAB — GC/CHLAMYDIA PROBE AMP (~~LOC~~) NOT AT ARMC
CHLAMYDIA, DNA PROBE: NEGATIVE
NEISSERIA GONORRHEA: NEGATIVE

## 2017-10-31 ENCOUNTER — Encounter (HOSPITAL_COMMUNITY): Payer: Self-pay

## 2017-10-31 ENCOUNTER — Ambulatory Visit (HOSPITAL_COMMUNITY)
Admission: RE | Admit: 2017-10-31 | Discharge: 2017-10-31 | Disposition: A | Discharge status: Home / Self Care | Payer: Medicaid Other | Source: Ambulatory Visit | Attending: Family Medicine | Admitting: Family Medicine

## 2017-11-05 ENCOUNTER — Emergency Department (HOSPITAL_COMMUNITY)
Admission: EM | Admit: 2017-11-05 | Discharge: 2017-11-05 | Disposition: A | Discharge status: Home / Self Care | Payer: Medicaid Other | Attending: Emergency Medicine | Admitting: Emergency Medicine

## 2017-11-05 ENCOUNTER — Other Ambulatory Visit: Payer: Self-pay

## 2017-11-05 DIAGNOSIS — O209 Hemorrhage in early pregnancy, unspecified: Secondary | ICD-10-CM

## 2017-11-05 DIAGNOSIS — F17210 Nicotine dependence, cigarettes, uncomplicated: Secondary | ICD-10-CM | POA: Insufficient documentation

## 2017-11-05 DIAGNOSIS — M792 Neuralgia and neuritis, unspecified: Secondary | ICD-10-CM | POA: Diagnosis not present

## 2017-11-05 DIAGNOSIS — R109 Unspecified abdominal pain: Secondary | ICD-10-CM

## 2017-11-05 DIAGNOSIS — O034 Incomplete spontaneous abortion without complication: Secondary | ICD-10-CM

## 2017-11-05 DIAGNOSIS — O26891 Other specified pregnancy related conditions, first trimester: Secondary | ICD-10-CM

## 2017-11-05 DIAGNOSIS — R51 Headache: Secondary | ICD-10-CM | POA: Diagnosis present

## 2017-11-05 MED ORDER — METHYLPREDNISOLONE 4 MG PO TBPK: ORAL_TABLET | ORAL | 0 refills | Status: DC

## 2017-11-05 MED ORDER — TRAMADOL HCL 50 MG PO TABS: 50.0000 mg | ORAL_TABLET | Freq: Four times a day (QID) | ORAL | 0 refills | Status: DC | PRN

## 2017-11-05 MED ORDER — NAPROXEN 375 MG PO TABS: 375.0000 mg | ORAL_TABLET | Freq: Two times a day (BID) | ORAL | 0 refills | Status: DC

## 2017-11-05 NOTE — Discharge Instructions (Signed)
YOU MUST FOLLOW UP WITH YOUR DOCTOR FOR FURTHER WORK UP IN THE OUTPATIENT SETTING.  RETURN TO THE EMERGENCY DEPARTMENT FOR THE FOLLOWING REASONS: New numbness, tingling, weakness, or problem with the use of your arms or legs.  Severe back pain not improved by medications.  Loss of control of your bowel or bladder because you cannot feel it.  Increasing pain in any new ares of the body (such as chest or abdominal pain).  Shortness of breath. Unstoppable vomiting or fever.

## 2017-11-05 NOTE — ED Notes (Signed)
Pt ambulated to bathroom without assistance 

## 2017-11-05 NOTE — ED Triage Notes (Signed)
Patient was seen here Sunday for same; states HA that begins behind her right eye and then the pain shoots down her right side sometimes making the RLE unable to move.
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
## 2017-11-05 NOTE — ED Notes (Signed)
ED Provider at bedside. 

## 2018-01-16 ENCOUNTER — Encounter (HOSPITAL_COMMUNITY): Payer: Self-pay

## 2018-01-16 ENCOUNTER — Emergency Department (HOSPITAL_COMMUNITY)
Admission: EM | Admit: 2018-01-16 | Discharge: 2018-01-16 | Disposition: A | Discharge status: Home / Self Care | Payer: Medicaid Other | Attending: Emergency Medicine | Admitting: Emergency Medicine

## 2018-01-16 ENCOUNTER — Ambulatory Visit (HOSPITAL_COMMUNITY)
Admission: EM | Admit: 2018-01-16 | Discharge: 2018-01-16 | Disposition: A | Discharge status: Home / Self Care | Payer: Medicaid Other

## 2018-01-16 ENCOUNTER — Encounter (HOSPITAL_COMMUNITY): Payer: Self-pay | Admitting: Emergency Medicine

## 2018-01-16 DIAGNOSIS — Z5321 Procedure and treatment not carried out due to patient leaving prior to being seen by health care provider: Secondary | ICD-10-CM | POA: Diagnosis not present

## 2018-01-16 DIAGNOSIS — S01112A Laceration without foreign body of left eyelid and periocular area, initial encounter: Secondary | ICD-10-CM

## 2018-01-16 DIAGNOSIS — H538 Other visual disturbances: Secondary | ICD-10-CM

## 2018-01-16 DIAGNOSIS — H02846 Edema of left eye, unspecified eyelid: Secondary | ICD-10-CM

## 2018-01-16 DIAGNOSIS — R51 Headache: Secondary | ICD-10-CM

## 2018-01-16 DIAGNOSIS — W208XXA Other cause of strike by thrown, projected or falling object, initial encounter: Secondary | ICD-10-CM

## 2018-01-16 DIAGNOSIS — H5712 Ocular pain, left eye: Secondary | ICD-10-CM | POA: Diagnosis present

## 2018-01-16 DIAGNOSIS — H5789 Other specified disorders of eye and adnexa: Secondary | ICD-10-CM

## 2018-01-16 DIAGNOSIS — S0990XA Unspecified injury of head, initial encounter: Secondary | ICD-10-CM | POA: Diagnosis not present

## 2018-01-16 NOTE — ED Triage Notes (Signed)
Pt presents from [facility] for evaluation of eye injury. Pt reports difficulty seeing out of L eye.
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
## 2018-01-16 NOTE — ED Notes (Signed)
Pt to desk stating she cannot stay because her ride is here and ready to go.

## 2018-01-17 ENCOUNTER — Emergency Department (HOSPITAL_COMMUNITY): Payer: Medicaid Other

## 2018-01-17 ENCOUNTER — Emergency Department (HOSPITAL_COMMUNITY)
Admission: EM | Admit: 2018-01-17 | Discharge: 2018-01-17 | Disposition: A | Discharge status: Home / Self Care | Payer: Medicaid Other | Attending: Emergency Medicine | Admitting: Emergency Medicine

## 2018-01-17 ENCOUNTER — Encounter (HOSPITAL_COMMUNITY): Payer: Self-pay | Admitting: *Deleted

## 2018-01-17 DIAGNOSIS — F17210 Nicotine dependence, cigarettes, uncomplicated: Secondary | ICD-10-CM | POA: Insufficient documentation

## 2018-01-17 DIAGNOSIS — Y9389 Activity, other specified: Secondary | ICD-10-CM | POA: Diagnosis not present

## 2018-01-17 DIAGNOSIS — S0592XA Unspecified injury of left eye and orbit, initial encounter: Secondary | ICD-10-CM | POA: Diagnosis present

## 2018-01-17 DIAGNOSIS — Y929 Unspecified place or not applicable: Secondary | ICD-10-CM | POA: Insufficient documentation

## 2018-01-17 DIAGNOSIS — Z23 Encounter for immunization: Secondary | ICD-10-CM | POA: Diagnosis not present

## 2018-01-17 DIAGNOSIS — S00212A Abrasion of left eyelid and periocular area, initial encounter: Secondary | ICD-10-CM | POA: Diagnosis not present

## 2018-01-17 DIAGNOSIS — S0512XA Contusion of eyeball and orbital tissues, left eye, initial encounter: Secondary | ICD-10-CM | POA: Diagnosis not present

## 2018-01-17 DIAGNOSIS — Y999 Unspecified external cause status: Secondary | ICD-10-CM | POA: Insufficient documentation

## 2018-01-17 DIAGNOSIS — Z79899 Other long term (current) drug therapy: Secondary | ICD-10-CM | POA: Diagnosis not present

## 2018-01-17 DIAGNOSIS — R55 Syncope and collapse: Secondary | ICD-10-CM | POA: Insufficient documentation

## 2018-01-17 DIAGNOSIS — T148XXA Other injury of unspecified body region, initial encounter: Secondary | ICD-10-CM

## 2018-01-17 MED ORDER — TETANUS-DIPHTHERIA TOXOIDS TD 5-2 LFU IM INJ: 0.5000 mL | INJECTION | Freq: Once | INTRAMUSCULAR | Status: DC

## 2018-01-17 MED ORDER — TETANUS-DIPHTH-ACELL PERTUSSIS 5-2.5-18.5 LF-MCG/0.5 IM SUSP
0.5000 mL | Freq: Once | INTRAMUSCULAR | Status: AC
  Administered 2018-01-17: 0.5 mL via INTRAMUSCULAR
  Filled 2018-01-17: qty 0.5

## 2018-01-17 NOTE — ED Notes (Signed)
Visual acuity Right 20/32 Left could not read top (20/160) line until 55ft from pt.
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
## 2018-01-17 NOTE — Discharge Instructions (Addendum)
Please return for any problem.  Follow-up with your regular care provider as instructed.  If you have worsening pain, fever, or difficulty with your vision please return for reevaluation.  Follow-up with ophthalmology [facility]) as instructed.

## 2018-03-03 ENCOUNTER — Inpatient Hospital Stay (HOSPITAL_COMMUNITY)
Admission: EM | Admit: 2018-03-03 | Discharge: 2018-03-03 | Disposition: A | Discharge status: Home / Self Care | Payer: Medicaid Other | Attending: Obstetrics and Gynecology | Admitting: Obstetrics and Gynecology

## 2018-03-03 ENCOUNTER — Other Ambulatory Visit: Payer: Self-pay

## 2018-03-03 ENCOUNTER — Encounter (HOSPITAL_COMMUNITY): Payer: Self-pay

## 2018-03-03 DIAGNOSIS — F17210 Nicotine dependence, cigarettes, uncomplicated: Secondary | ICD-10-CM | POA: Insufficient documentation

## 2018-03-03 DIAGNOSIS — Z3A21 21 weeks gestation of pregnancy: Secondary | ICD-10-CM | POA: Diagnosis not present

## 2018-03-03 DIAGNOSIS — O99332 Smoking (tobacco) complicating pregnancy, second trimester: Secondary | ICD-10-CM | POA: Diagnosis not present

## 2018-03-03 DIAGNOSIS — O219 Vomiting of pregnancy, unspecified: Secondary | ICD-10-CM

## 2018-03-03 LAB — URINALYSIS, ROUTINE W REFLEX MICROSCOPIC
BILIRUBIN URINE: NEGATIVE
Glucose, UA: NEGATIVE mg/dL
Hgb urine dipstick: NEGATIVE
Ketones, ur: 80 mg/dL — AB
Leukocytes,Ua: NEGATIVE
Nitrite: NEGATIVE
Protein, ur: 30 mg/dL — AB
Specific Gravity, Urine: 1.024 (ref 1.005–1.030)
pH: 6 (ref 5.0–8.0)

## 2018-03-03 LAB — INFLUENZA PANEL BY PCR (TYPE A & B)
Influenza A By PCR: NEGATIVE
Influenza B By PCR: NEGATIVE

## 2018-03-03 LAB — PREGNANCY, URINE: Preg Test, Ur: POSITIVE — AB

## 2018-03-03 MED ORDER — ONDANSETRON HCL 4 MG/2ML IJ SOLN
4.0000 mg | Freq: Once | INTRAMUSCULAR | Status: AC
  Administered 2018-03-03: 4 mg via INTRAVENOUS
  Filled 2018-03-03: qty 2

## 2018-03-03 MED ORDER — SODIUM CHLORIDE 0.9 % IV BOLUS
1000.0000 mL | Freq: Once | INTRAVENOUS | Status: AC
  Administered 2018-03-03: 1000 mL via INTRAVENOUS

## 2018-03-03 MED ORDER — ONDANSETRON 4 MG PO TBDP: 4.0000 mg | ORAL_TABLET | Freq: Three times a day (TID) | ORAL | 0 refills | Status: DC | PRN

## 2018-03-03 MED ORDER — ACETAMINOPHEN 500 MG PO TABS
500.0000 mg | ORAL_TABLET | Freq: Once | ORAL | Status: AC
  Administered 2018-03-03: 500 mg via ORAL
  Filled 2018-03-03: qty 1

## 2018-03-03 MED ORDER — DEXTROSE-NACL 5-0.45 % IV SOLN
INTRAVENOUS | Status: AC
  Administered 2018-03-03: 1000 mL via INTRAVENOUS

## 2018-03-03 NOTE — Discharge Instructions (Signed)
The cause of this condition is not known. It may be related to changes in chemicals (hormones) in the body during pregnancy, such as the high level of pregnancy hormone (human chorionic gonadotropin) or the increase in the female sex hormone (estrogen). What are the signs or symptoms? Symptoms of this condition include:  Nausea that does not go away.  Vomiting that does not allow you to keep any food down.  Weight loss.  Body fluid loss (dehydration).  Having no desire to eat, or not liking food that you have previously enjoyed. How is this diagnosed? This condition may be diagnosed based on:  A physical exam.  Your medical history.  Your symptoms.  Blood tests.  Urine tests. How is this treated? This condition is managed by controlling symptoms. This may include:  Following an eating plan. This can help lessen nausea and vomiting.  Taking prescription medicines. An eating plan and medicines are often used together to help control symptoms. If medicines do not help relieve nausea and vomiting, you may need to receive fluids through an IV at the hospital. Follow these instructions at home: Eating and drinking   Avoid the following: ? Drinking fluids with meals. Try not to drink anything during the 30 minutes before and after your meals. ? Drinking more than 1 cup of fluid at a time. ? Eating foods that trigger your symptoms. These may include spicy foods, coffee, high-fat foods, very sweet foods, and acidic foods. ? Skipping meals. Nausea can be more intense on an empty stomach. If you cannot tolerate food, do not force it. Try sucking on ice chips or other frozen items and make up for missed calories later. ? Lying down within 2 hours after eating. ? Being exposed to environmental triggers. These may include food smells, smoky rooms, closed spaces, rooms with strong smells, warm or humid places, overly loud and noisy rooms, and rooms with motion or flickering lights. Try  eating meals in a well-ventilated area that is free of strong smells. ? Quick and sudden changes in your movement. ? Taking iron pills and multivitamins that contain iron. If you take prescription iron pills, do not stop taking them unless your health care provider approves. ? Preparing food. The smell of food can spoil your appetite or trigger nausea.  To help relieve your symptoms: ? Listen to your body. Everyone is different and has different preferences. Find what works best for you. ? Eat and drink slowly. ? Eat 5-6 small meals daily instead of 3 large meals. Eating small meals and snacks can help you avoid an empty stomach. ? In the morning, before getting out of bed, eat a couple of crackers to avoid moving around on an empty stomach. ? Try eating starchy foods as these are usually tolerated well. Examples include cereal, toast, bread, potatoes, pasta, rice, and pretzels. ? Include at least 1 serving of protein with your meals and snacks. Protein options include lean meats, poultry, seafood, beans, nuts, nut butters, eggs, cheese, and yogurt. ? Try eating a protein-rich snack before bed. Examples of a protein-rick snack include cheese and crackers or a peanut butter sandwich made with 1 slice of whole-wheat bread and 1 tsp (5 g) of peanut butter. ? Eat or suck on things that have ginger in them. It may help relieve nausea. Add  tsp ground ginger to hot tea or choose ginger tea. ? Try drinking 100% fruit juice or an electrolyte drink. An electrolyte drink contains sodium, potassium, and chloride. ?  Drink fluids that are cold, clear, and carbonated or sour. Examples include lemonade, ginger ale, lemon-lime soda, ice water, and sparkling water. ? Brush your teeth or use a mouth rinse after meals. ? Talk with your health care provider about starting a supplement of vitamin B6. General instructions  Take over-the-counter and prescription medicines only as told by your health care  provider.  Follow instructions from your health care provider about eating or drinking restrictions.  Continue to take your prenatal vitamins as told by your health care provider. If you are having trouble taking your prenatal vitamins, talk with your health care provider about different options.  Keep all follow-up and pre-birth (prenatal) visits as told by your health care provider. This is important. Contact a health care provider if:  You have pain in your abdomen.  You have a severe headache.  You have vision problems.  You are losing weight.  You feel weak or dizzy. Get help right away if:  You cannot drink fluids without vomiting.  You vomit blood.  You have constant nausea and vomiting.  You are very weak.  You faint.  You have a fever and your symptoms suddenly get worse. Summary  Making some changes to your eating habits may help relieve nausea and vomiting.  This condition may be managed with medicine.  If medicines do not help relieve nausea and vomiting, you may need to receive fluids through an IV at the hospital. This information is not intended to replace advice given to you by your health care provider. Make sure you discuss any questions you have with your health care provider. Document Released: 12/18/2004 Document Revised: 01/07/2017 Document Reviewed: 08/17/2015 Elsevier Interactive Patient Education  2019 [employer].

## 2018-03-03 NOTE — ED Notes (Signed)
Patient asked for urine sample, she states that she is unable to give urine sample at this time.

## 2018-03-03 NOTE — MAU Note (Signed)
Pt sent over from [facility] due to vomiting, pt in triage at 1830 and states she wants to go home because she has not vomited in over 2 hours. Denies abd pain or nausea at this time.
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
## 2018-03-03 NOTE — ED Notes (Signed)
EDP, [facility] at beside

## 2018-03-03 NOTE — MAU Provider Note (Signed)
History     CSN: [id number]  Arrival date and time: 03/03/18 1048   None     Chief Complaint  Patient presents with  . Chills    flu-like symptoms  . Emesis   HPI   CATE ORAVEC is a 28 y.o. [id number] at 4w by uncertain LMP who presents to MAU via [facility] transfer for evaluation of nausea and vomiting in early pregnancy. Upon arrival to triage in MAU patient declines further workup and requests immediate discharge from triage. She states she has not vomited or felt nauseated in over two hours. She denies abdominal cramping or bleeding.  OB History    Gravida  5   Para  3   Term  2   Preterm  1   AB  0   Living  3     SAB  0   TAB  0   Ectopic  0   Multiple  0   Live Births  3           Past Medical History:  Diagnosis Date  . Abnormal uterine bleeding (AUB) 05/28/2014  . Bronchitis   . BV (bacterial vaginosis) 12/10/2012  . Contraceptive education 07/16/2013  . Contraceptive management 04/21/2015  . Decreased appetite 05/28/2014  . Dyspareunia 12/10/2012  . Gonorrhea affecting pregnancy in first trimester 05/04/2016   Treated in ER 5/1  . Hematuria 07/06/2015  . History of chlamydia   . History of PID 07/16/2013  . Irregular intermenstrual bleeding 07/06/2015  . Irregular periods 01/18/2015  . Migraine   . Nausea 01/18/2015  . Nexplanon in place 07/06/2014  . Nexplanon insertion 08/27/2013  . Pain with urination 07/06/2015  . PID (pelvic inflammatory disease)   . RUQ pain 07/16/2013  . Strain of rectus abdominis muscle 06/10/2014  . Umbilical hernia 04/07/960  . UTI (lower urinary tract infection)   . Vaginal discharge 12/10/2012  . Weight loss 07/06/2014  . Wheezing on expiration 12/09/2014  . Yeast infection 07/16/2013    Past Surgical History:  Procedure Laterality Date  . CESAREAN SECTION  12/01/2016  . DILATION AND CURETTAGE OF UTERUS N/A 03/18/2012   Procedure: DILATATION AND CURETTAGE;  Surgeon: Mora Bellman, MD;  Location: [facility];  Service:  Gynecology;  Laterality: N/A;  . INSERTION OF MESH N/A 08/18/2014   Procedure: INSERTION OF MESH;  Surgeon: [name], MD;  Location: [facility];  Service: General;  Laterality: N/A;  . tooth pulled    . UMBILICAL HERNIA REPAIR N/A 08/18/2014   Procedure: UMBILICAL HERNIORRHAPHY;  Surgeon: [name], MD;  Location: [facility];  Service: General;  Laterality: N/A;  . WISDOM TOOTH EXTRACTION      Family History  Problem Relation Age of Onset  . Diabetes Cousin   . Ulcers Cousin   . Deafness Paternal Grandmother   . Migraines Paternal Grandmother   . Migraines Mother   . Migraines Sister   . Migraines Sister   . Heart attack Other        maternal great great grandma  . Mental illness Maternal Aunt   . Other Daughter        omphalecle  . Anesthesia problems Neg Hx   . Hypotension Neg Hx   . Malignant hyperthermia Neg Hx   . Pseudochol deficiency Neg Hx   . Colon cancer Neg Hx     Social History   Tobacco Use  . Smoking status: Current Every Day Smoker    Packs/day: 0.20  Years: 2.00    Pack years: 0.40    Types: Cigarettes    Last attempt to quit: 01/02/2016    Years since quitting: 2.1  . Smokeless tobacco: Never Used  Substance Use Topics  . Alcohol use: Not Currently    Alcohol/week: 1.0 standard drinks    Types: 1 Glasses of wine per week    Comment: rarely (holidays and family events)  . Drug use: Yes    Types: Marijuana    Comment: Last use 06/30/17    Allergies:  Allergies  Allergen Reactions  . Bee Venom Anaphylaxis  . Other Anaphylaxis, Swelling and Rash    Tomatoes.    Medications Prior to Admission  Medication Sig Dispense Refill Last Dose  . misoprostol (CYTOTEC) 200 MCG tablet Place four tablets in between your gums and cheeks (two tablets on each side) 24 hours after first dose (Patient not taking: Reported on 11/05/2017) 4 tablet 0 Completed Course at Unknown time  . naproxen (NAPROSYN) 375 MG tablet Take 1 tablet (375 mg total) by mouth 2 (two)  times daily. 20 tablet 0   . traMADol (ULTRAM) 50 MG tablet Take 1 tablet (50 mg total) by mouth every 6 (six) hours as needed for moderate pain or severe pain. 20 tablet 0 01/16/2018 at Unknown time    Review of Systems  Constitutional: Negative for chills, fatigue and fever.  Gastrointestinal: Negative for abdominal pain, nausea and vomiting.  Genitourinary: Negative for difficulty urinating, dyspareunia, dysuria, flank pain, vaginal bleeding, vaginal discharge and vaginal pain.  Musculoskeletal: Negative for back pain.  Neurological: Negative for headaches.  All other systems reviewed and are negative.  Physical Exam   Blood pressure 133/76, pulse 71, temperature 98.8 F (37.1 C), temperature source Oral, resp. rate 16, height 5\' 8"  (1.727 m), weight 63.5 kg, last menstrual period 02/03/2018, SpO2 100 %, unknown if currently breastfeeding.  Physical Exam  Nursing note and vitals reviewed. Constitutional: She is oriented to person, place, and time. She appears well-developed and well-nourished.  Cardiovascular: Normal rate.  Respiratory: Effort normal.  GI: Soft.  Musculoskeletal: Normal range of motion.  Neurological: She is alert and oriented to person, place, and time.  Skin: Skin is warm and dry.  Psychiatric: She has a normal mood and affect. Her behavior is normal. Judgment and thought content normal.    MAU Course  Procedures  --Patient interviewed by CNM in triage room after triage by Charge RN. Patient continues to decline further assessment and treatment  --No concerning findings on physical exam. No bleeding or cramping, non-tender abdomen.   Patient Vitals for the past 24 hrs:  BP Temp Temp src Pulse Resp SpO2 Height Weight  03/03/18 1534 133/76 98.8 F (37.1 C) Oral 71 16 100 % - -  03/03/18 1424 135/80 98.7 F (37.1 C) Oral 71 17 98 % - -  03/03/18 1145 114/68 - - 79 - 100 % - -  03/03/18 1057 - - - - - - 5\' 8"  (1.727 m) 63.5 kg  03/03/18 1056 116/74 98.5 F  (36.9 C) Oral 90 18 99 % - -     Assessment and Plan  --28 y.o. [id number] at [redacted]w[redacted]d by uncertain LMP --Zofran prescribed by ED Provider --First trimester/bleeding precautions reviewed with patient --Discharge home in stable condition  F/U: Outpatient viability ultrasound ordered within next two weeks         Patient to establish prenatal care around [redacted] weeks gestation  Darlina Rumpf, CNM 03/03/2018, 6:56 PM

## 2018-03-03 NOTE — ED Notes (Signed)
Patient transferred to MAU

## 2018-03-03 NOTE — ED Notes (Signed)
Patient discharged after receiving fluids. Feels less pain but states that she stills feels a little nauseous. Discharge information reviewed and acknowledged as patient is in progressive bed. States that she will pick up medication from pharmacy as soon as she leaves for relief. Jake Bathe, MEd, RN CEN 03/03/2018 4:16 PM

## 2018-03-03 NOTE — ED Notes (Signed)
Patient in hallway vomiting. Spoke to EDP. Patient to be reevaluated.

## 2018-03-03 NOTE — ED Triage Notes (Signed)
Pt states that she has been unable to perform any activity x 4 days; pt states that she has had hot and cold flashes and body aches

## 2018-03-16 ENCOUNTER — Other Ambulatory Visit: Payer: Self-pay

## 2018-03-16 ENCOUNTER — Inpatient Hospital Stay (HOSPITAL_COMMUNITY)
Admission: AD | Admit: 2018-03-16 | Discharge: 2018-03-16 | Disposition: A | Discharge status: Home / Self Care | Payer: Medicaid Other | Attending: Obstetrics and Gynecology | Admitting: Obstetrics and Gynecology

## 2018-03-16 ENCOUNTER — Inpatient Hospital Stay (HOSPITAL_COMMUNITY): Payer: Medicaid Other

## 2018-03-16 ENCOUNTER — Encounter (HOSPITAL_COMMUNITY): Payer: Self-pay

## 2018-03-16 DIAGNOSIS — O468X1 Other antepartum hemorrhage, first trimester: Secondary | ICD-10-CM

## 2018-03-16 DIAGNOSIS — Z3490 Encounter for supervision of normal pregnancy, unspecified, unspecified trimester: Secondary | ICD-10-CM

## 2018-03-16 DIAGNOSIS — O418X10 Other specified disorders of amniotic fluid and membranes, first trimester, not applicable or unspecified: Secondary | ICD-10-CM | POA: Diagnosis not present

## 2018-03-16 DIAGNOSIS — O99331 Smoking (tobacco) complicating pregnancy, first trimester: Secondary | ICD-10-CM | POA: Diagnosis not present

## 2018-03-16 DIAGNOSIS — Z3A08 8 weeks gestation of pregnancy: Secondary | ICD-10-CM | POA: Diagnosis not present

## 2018-03-16 DIAGNOSIS — R109 Unspecified abdominal pain: Secondary | ICD-10-CM | POA: Insufficient documentation

## 2018-03-16 DIAGNOSIS — O26899 Other specified pregnancy related conditions, unspecified trimester: Secondary | ICD-10-CM

## 2018-03-16 DIAGNOSIS — O26891 Other specified pregnancy related conditions, first trimester: Secondary | ICD-10-CM | POA: Diagnosis not present

## 2018-03-16 DIAGNOSIS — R102 Pelvic and perineal pain: Secondary | ICD-10-CM | POA: Diagnosis not present

## 2018-03-16 DIAGNOSIS — F17210 Nicotine dependence, cigarettes, uncomplicated: Secondary | ICD-10-CM | POA: Insufficient documentation

## 2018-03-16 LAB — CBC
HCT: 33.6 % — ABNORMAL LOW (ref 36.0–46.0)
Hemoglobin: 10.8 g/dL — ABNORMAL LOW (ref 12.0–15.0)
MCH: 27.1 pg (ref 26.0–34.0)
MCHC: 32.1 g/dL (ref 30.0–36.0)
MCV: 84.2 fL (ref 80.0–100.0)
Platelets: 227 10*3/uL (ref 150–400)
RBC: 3.99 MIL/uL (ref 3.87–5.11)
RDW: 15.2 % (ref 11.5–15.5)
WBC: 11.6 10*3/uL — ABNORMAL HIGH (ref 4.0–10.5)
nRBC: 0 % (ref 0.0–0.2)

## 2018-03-16 LAB — URINALYSIS, ROUTINE W REFLEX MICROSCOPIC
Bilirubin Urine: NEGATIVE
GLUCOSE, UA: NEGATIVE mg/dL
Hgb urine dipstick: NEGATIVE
Ketones, ur: 5 mg/dL — AB
Leukocytes,Ua: NEGATIVE
Nitrite: NEGATIVE
Protein, ur: 30 mg/dL — AB
Specific Gravity, Urine: 1.028 (ref 1.005–1.030)
pH: 6 (ref 5.0–8.0)

## 2018-03-16 LAB — WET PREP, GENITAL
Clue Cells Wet Prep HPF POC: NONE SEEN
Sperm: NONE SEEN
Trich, Wet Prep: NONE SEEN
Yeast Wet Prep HPF POC: NONE SEEN

## 2018-03-16 LAB — COMPREHENSIVE METABOLIC PANEL
ALT: 12 U/L (ref 0–44)
AST: 17 U/L (ref 15–41)
Albumin: 3.6 g/dL (ref 3.5–5.0)
Alkaline Phosphatase: 33 U/L — ABNORMAL LOW (ref 38–126)
Anion gap: 4 — ABNORMAL LOW (ref 5–15)
BUN: 6 mg/dL (ref 6–20)
CHLORIDE: 105 mmol/L (ref 98–111)
CO2: 24 mmol/L (ref 22–32)
CREATININE: 0.49 mg/dL (ref 0.44–1.00)
Calcium: 8.4 mg/dL — ABNORMAL LOW (ref 8.9–10.3)
GFR calc Af Amer: >60 mL/min (ref >60)
Glucose, Bld: 91 mg/dL (ref 70–99)
Potassium: 3.7 mmol/L (ref 3.5–5.1)
Sodium: 133 mmol/L — ABNORMAL LOW (ref 135–145)
Total Bilirubin: 0.5 mg/dL (ref 0.3–1.2)
Total Protein: 5.9 g/dL — ABNORMAL LOW (ref 6.5–8.1)

## 2018-03-16 LAB — HCG, QUANTITATIVE, PREGNANCY: hCG, Beta Chain, Quant, S: 75479 m[IU]/mL — ABNORMAL HIGH (ref <5)

## 2018-03-16 NOTE — MAU Provider Note (Signed)
Patient Lori Johnston is a 28 y.o. [phone] At [redacted]w[redacted]d here with abdominal pain. She declines vaginal bleeding, vaginal itching, dysuria, pelvic pain, or nausea and vomiting.  History     CSN: [id number]  Arrival date and time: 03/16/18 0134   First Provider Initiated Contact with Patient 03/16/18 0329      Chief Complaint  Patient presents with  . Abdominal Pain  . Vaginal Discharge   HPI  OB History    Gravida  5   Para  3   Term  2   Preterm  1   AB  1   Living  3     SAB  1   TAB  0   Ectopic  0   Multiple  0   Live Births  3           Past Medical History:  Diagnosis Date  . Abnormal uterine bleeding (AUB) 05/28/2014  . Bronchitis   . BV (bacterial vaginosis) 12/10/2012  . Contraceptive education 07/16/2013  . Contraceptive management 04/21/2015  . Decreased appetite 05/28/2014  . Dyspareunia 12/10/2012  . Gonorrhea affecting pregnancy in first trimester 05/04/2016   Treated in ER 5/1  . Hematuria 07/06/2015  . History of chlamydia   . History of PID 07/16/2013  . Irregular intermenstrual bleeding 07/06/2015  . Irregular periods 01/18/2015  . Migraine   . Nausea 01/18/2015  . Nexplanon in place 07/06/2014  . Nexplanon insertion 08/27/2013  . Pain with urination 07/06/2015  . PID (pelvic inflammatory disease)   . RUQ pain 07/16/2013  . Strain of rectus abdominis muscle 06/10/2014  . Umbilical hernia 08/02/9560  . UTI (lower urinary tract infection)   . Vaginal discharge 12/10/2012  . Weight loss 07/06/2014  . Wheezing on expiration 12/09/2014  . Yeast infection 07/16/2013    Past Surgical History:  Procedure Laterality Date  . CESAREAN SECTION  12/01/2016  . DILATION AND CURETTAGE OF UTERUS N/A 03/18/2012   Procedure: DILATATION AND CURETTAGE;  Surgeon: Mora Bellman, MD;  Location: [facility];  Service: Gynecology;  Laterality: N/A;  . INSERTION OF MESH N/A 08/18/2014   Procedure: INSERTION OF MESH;  Surgeon: [name], MD;  Location: [facility];  Service: General;   Laterality: N/A;  . tooth pulled    . UMBILICAL HERNIA REPAIR N/A 08/18/2014   Procedure: UMBILICAL HERNIORRHAPHY;  Surgeon: [name], MD;  Location: [facility];  Service: General;  Laterality: N/A;  . WISDOM TOOTH EXTRACTION      Family History  Problem Relation Age of Onset  . Diabetes Cousin   . Ulcers Cousin   . Deafness Paternal Grandmother   . Migraines Paternal Grandmother   . Migraines Mother   . Migraines Sister   . Migraines Sister   . Heart attack Other        maternal great great grandma  . Mental illness Maternal Aunt   . Other Daughter        omphalecle  . Anesthesia problems Neg Hx   . Hypotension Neg Hx   . Malignant hyperthermia Neg Hx   . Pseudochol deficiency Neg Hx   . Colon cancer Neg Hx     Social History   Tobacco Use  . Smoking status: Current Every Day Smoker    Packs/day: 0.20    Years: 2.00    Pack years: 0.40    Types: Cigarettes    Last attempt to quit: 01/02/2016    Years since quitting: 2.2  . Smokeless tobacco: Never  Used  . Tobacco comment: Smokes black and milds last use 03/11/2018  Substance Use Topics  . Alcohol use: Not Currently    Alcohol/week: 1.0 standard drinks    Types: 1 Glasses of wine per week    Comment: rarely (holidays and family events)  . Drug use: Yes    Types: Marijuana    Comment: Last use 02/09/2018    Allergies:  Allergies  Allergen Reactions  . Bee Venom Anaphylaxis  . Other Anaphylaxis, Swelling and Rash    Tomatoes.    Medications Prior to Admission  Medication Sig Dispense Refill Last Dose  . naproxen (NAPROSYN) 375 MG tablet Take 1 tablet (375 mg total) by mouth 2 (two) times daily. 20 tablet 0   . ondansetron (ZOFRAN ODT) 4 MG disintegrating tablet Take 1 tablet (4 mg total) by mouth every 8 (eight) hours as needed for nausea or vomiting. 20 tablet 0     Review of Systems  Constitutional: Negative.   HENT: Negative.   Respiratory: Negative.   Cardiovascular: Negative.   Gastrointestinal:  Negative.   Genitourinary: Negative.   Neurological: Negative.    Physical Exam   Blood pressure (!) 117/52, pulse 74, temperature 98.9 F (37.2 C), resp. rate 17, last menstrual period 02/03/2018, SpO2 100 %, unknown if currently breastfeeding.  Physical Exam  Constitutional: She is oriented to person, place, and time. She appears well-developed and well-nourished.  HENT:  Head: Normocephalic.  Neck: Normal range of motion.  GI: Soft.  Genitourinary:    Genitourinary Comments: NEFG; no CMT, no suprapubic or adnexal tenderness.    Musculoskeletal: Normal range of motion.  Neurological: She is alert and oriented to person, place, and time.  Skin: Skin is warm and dry.  Psychiatric: She has a normal mood and affect.    MAU Course  Procedures  MDM -rule out ectopic pregnancy performed: patient has living IUP at 8 weeks 0 days and small [facility] -wet prep negative -gc pending -UA normal  Assessment and Plan   1. Intrauterine pregnancy   2. Pelvic pain in pregnancy    2. Patient stable for discharge with early pregnancy warning signs reviewed. Discussed [facility] and what to expect vs. Excessive bleeding and signs of miscarriage.   3. Patient will contact [facility] for follow up care.   4. All questions answered  Mervyn Skeeters [facility] 03/16/2018, 3:49 AM

## 2018-03-16 NOTE — Discharge Instructions (Signed)
Subchorionic Hematoma  A subchorionic hematoma is a gathering of blood between the outer wall of the embryo (chorion) and the inner wall of the womb (uterus). This condition can cause vaginal bleeding. If they cause little or no vaginal bleeding, early small hematomas usually shrink on their own and do not affect your baby or pregnancy. When bleeding starts later in pregnancy, or if the hematoma is larger or occurs in older pregnant women, the condition may be more serious. Larger hematomas may get bigger, which increases the chances of miscarriage. This condition also increases the risk of:  Premature separation of the placenta from the uterus.  Premature (preterm) labor.  Stillbirth. What are the causes? The exact cause of this condition is not known. It occurs when blood is trapped between the placenta and the uterine wall because the placenta has separated from the original site of implantation. What increases the risk? You are more likely to develop this condition if:  You were treated with fertility medicines.  You conceived through in vitro fertilization (IVF). What are the signs or symptoms? Symptoms of this condition include:  Vaginal spotting or bleeding.  Contractions of the uterus. These cause abdominal pain. Sometimes you may have no symptoms and the bleeding may only be seen when ultrasound images are taken (transvaginal ultrasound). How is this diagnosed? This condition is diagnosed based on a physical exam. This includes a pelvic exam. You may also have other tests, including:  Blood tests.  Urine tests.  Ultrasound of the abdomen. How is this treated? Treatment for this condition can vary. Treatment may include:  Watchful waiting. You will be monitored closely for any changes in bleeding. During this stage: ? The hematoma may be reabsorbed by the body. ? The hematoma may separate the fluid-filled space containing the embryo (gestational sac) from the  wall of the womb (endometrium).  Medicines.  Activity restriction. This may be needed until the bleeding stops. Follow these instructions at home:  Stay on bed rest if told to do so by your health care provider.  Do not lift anything that is heavier than 10 lbs. (4.5 kg) or as told by your health care provider.  Do not use any products that contain nicotine or tobacco, such as cigarettes and e-cigarettes. If you need help quitting, ask your health care provider.  Track and write down the number of pads you use each day and how soaked (saturated) they are.  Do not use tampons.  Keep all follow-up visits as told by your health care provider. This is important. Your health care provider may ask you to have follow-up blood tests or ultrasound tests or both. Contact a health care provider if:  You have any vaginal bleeding.  You have a fever. Get help right away if:  You have severe cramps in your stomach, back, abdomen, or pelvis.  You pass large clots or tissue. Save any tissue for your health care provider to look at.  You have more vaginal bleeding, and you faint or become lightheaded or weak. Summary  A subchorionic hematoma is a gathering of blood between the outer wall of the placenta and the uterus.  This condition can cause vaginal bleeding.  Sometimes you may have no symptoms and the bleeding may only be seen when ultrasound images are taken.  Treatment may include watchful waiting, medicines, or activity restriction. This information is not intended to replace advice given to you by your health care provider. Make sure you discuss  any questions you have with your health care provider. Document Released: 04/04/2006 Document Revised: 02/14/2016 Document Reviewed: 02/14/2016 Elsevier Interactive Patient Education  2019 [place] of Pregnancy  The first trimester of pregnancy is from week 1 until the end of week 13 (months 1 through 3). During this  time, your baby will begin to develop inside you. At 6-8 weeks, the eyes and face are formed, and the heartbeat can be seen on ultrasound. At the end of 12 weeks, all the baby's organs are formed. Prenatal care is all the medical care you receive before the birth of your baby. Make sure you get good prenatal care and follow all of your doctor's instructions. Follow these instructions at home: Medicines  Take over-the-counter and prescription medicines only as told by your doctor. Some medicines are safe and some medicines are not safe during pregnancy.  Take a prenatal vitamin that contains at least 600 micrograms (mcg) of folic acid.  If you have trouble pooping (constipation), take medicine that will make your stool soft (stool softener) if your doctor approves. Eating and drinking   Eat regular, healthy meals.  Your doctor will tell you the amount of weight gain that is right for you.  Avoid raw meat and uncooked cheese.  If you feel sick to your stomach (nauseous) or throw up (vomit): ? Eat 4 or 5 small meals a day instead of 3 large meals. ? Try eating a few soda crackers. ? Drink liquids between meals instead of during meals.  To prevent constipation: ? Eat foods that are high in fiber, like fresh fruits and vegetables, whole grains, and beans. ? Drink enough fluids to keep your pee (urine) clear or pale yellow. Activity  Exercise only as told by your doctor. Stop exercising if you have cramps or pain in your lower belly (abdomen) or low back.  Do not exercise if it is too hot, too humid, or if you are in a place of great height (high altitude).  Try to avoid standing for long periods of time. Move your legs often if you must stand in one place for a long time.  Avoid heavy lifting.  Wear low-heeled shoes. Sit and stand up straight.  You can have sex unless your doctor tells you not to. Relieving pain and discomfort  Wear a good support bra if your breasts are  sore.  Take warm water baths (sitz baths) to soothe pain or discomfort caused by hemorrhoids. Use hemorrhoid cream if your doctor says it is okay.  Rest with your legs raised if you have leg cramps or low back pain.  If you have puffy, bulging veins (varicose veins) in your legs: ? Wear support hose or compression stockings as told by your doctor. ? Raise (elevate) your feet for 15 minutes, 3-4 times a day. ? Limit salt in your food. Prenatal care  Schedule your prenatal visits by the twelfth week of pregnancy.  Write down your questions. Take them to your prenatal visits.  Keep all your prenatal visits as told by your doctor. This is important. Safety  Wear your seat belt at all times when driving.  Make a list of emergency phone numbers. The list should include numbers for family, friends, the hospital, and police and fire departments. General instructions  Ask your doctor for a referral to a local prenatal class. Begin classes no later than at the start of month 6 of your pregnancy.  Ask for help if you need counseling or  if you need help with nutrition. Your doctor can give you advice or tell you where to go for help.  Do not use hot tubs, steam rooms, or saunas.  Do not douche or use tampons or scented sanitary pads.  Do not cross your legs for long periods of time.  Avoid all herbs and alcohol. Avoid drugs that are not approved by your doctor.  Do not use any tobacco products, including cigarettes, chewing tobacco, and electronic cigarettes. If you need help quitting, ask your doctor. You may get counseling or other support to help you quit.  Avoid cat litter boxes and soil used by cats. These carry germs that can cause birth defects in the baby and can cause a loss of your baby (miscarriage) or stillbirth.  Visit your dentist. At home, brush your teeth with a soft toothbrush. Be gentle when you floss. Contact a doctor if:  You are dizzy.  You have mild cramps or  pressure in your lower belly.  You have a nagging pain in your belly area.  You continue to feel sick to your stomach, you throw up, or you have watery poop (diarrhea).  You have a bad smelling fluid coming from your vagina.  You have pain when you pee (urinate).  You have increased puffiness (swelling) in your face, hands, legs, or ankles. Get help right away if:  You have a fever.  You are leaking fluid from your vagina.  You have spotting or bleeding from your vagina.  You have very bad belly cramping or pain.  You gain or lose weight rapidly.  You throw up blood. It may look like coffee grounds.  You are around people who have [place] measles, fifth disease, or chickenpox.  You have a very bad headache.  You have shortness of breath.  You have any kind of trauma, such as from a fall or a car accident. Summary  The first trimester of pregnancy is from week 1 until the end of week 13 (months 1 through 3).  To take care of yourself and your unborn baby, you will need to eat healthy meals, take medicines only if your doctor tells you to do so, and do activities that are safe for you and your baby.  Keep all follow-up visits as told by your doctor. This is important as your doctor will have to ensure that your baby is healthy and growing well. This information is not intended to replace advice given to you by your health care provider. Make sure you discuss any questions you have with your health care provider. Document Released: 06/06/2007 Document Revised: 12/27/2015 Document Reviewed: 12/27/2015 Elsevier Interactive Patient Education  2019 [employer].

## 2018-03-16 NOTE — MAU Note (Signed)
Patient states it feels "weird down there" when she uses the restroom.  Also having pain w/ intercourse.  No VB.  Not reporting any abnormal discharge.  LMP 02/14/2018.  Last intercourse 2 days ago.  No new sexual partners.  Also reports occasional abdominal cramping-hasn't taken anything for the pain.

## 2018-03-17 ENCOUNTER — Ambulatory Visit (HOSPITAL_COMMUNITY): Payer: Medicaid Other | Attending: Advanced Practice Midwife

## 2018-03-17 LAB — GC/CHLAMYDIA PROBE AMP (~~LOC~~) NOT AT ARMC
Chlamydia: NEGATIVE
Neisseria Gonorrhea: NEGATIVE

## 2018-04-25 ENCOUNTER — Encounter (HOSPITAL_COMMUNITY): Payer: Self-pay | Admitting: Emergency Medicine

## 2018-04-25 ENCOUNTER — Emergency Department (HOSPITAL_COMMUNITY)
Admission: EM | Admit: 2018-04-25 | Discharge: 2018-04-25 | Disposition: A | Discharge status: Home / Self Care | Payer: Medicaid Other | Attending: Emergency Medicine | Admitting: Emergency Medicine

## 2018-04-25 DIAGNOSIS — Z113 Encounter for screening for infections with a predominantly sexual mode of transmission: Secondary | ICD-10-CM | POA: Insufficient documentation

## 2018-04-25 DIAGNOSIS — Z202 Contact with and (suspected) exposure to infections with a predominantly sexual mode of transmission: Secondary | ICD-10-CM

## 2018-04-25 DIAGNOSIS — F17210 Nicotine dependence, cigarettes, uncomplicated: Secondary | ICD-10-CM | POA: Diagnosis not present

## 2018-04-25 LAB — POC URINE PREG, ED: Preg Test, Ur: NEGATIVE

## 2018-04-25 MED ORDER — AZITHROMYCIN 250 MG PO TABS
1000.0000 mg | ORAL_TABLET | Freq: Once | ORAL | Status: AC
  Administered 2018-04-25: 1000 mg via ORAL
  Filled 2018-04-25: qty 4

## 2018-04-25 MED ORDER — CEFTRIAXONE SODIUM 250 MG IJ SOLR
250.0000 mg | Freq: Once | INTRAMUSCULAR | Status: AC
  Administered 2018-04-25: 250 mg via INTRAMUSCULAR
  Filled 2018-04-25: qty 250

## 2018-04-25 MED ORDER — LIDOCAINE HCL (PF) 1 % IJ SOLN
INTRAMUSCULAR | Status: AC
  Administered 2018-04-25: 20:00:00 2 mL
  Filled 2018-04-25: qty 2

## 2018-04-25 NOTE — ED Triage Notes (Addendum)
Pt states her husband is having penile discharge. Pt states she is not having any symptoms or complaints at this time.
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
## 2018-04-25 NOTE — Discharge Instructions (Addendum)
Avoid intercourse for 7 days.  Follow-up with your primary or GYN provider if needed.

## 2018-04-28 NOTE — Progress Notes (Signed)
REVIEWED-NO ADDITIONAL RECOMMENDATIONS. 

## 2018-07-17 ENCOUNTER — Ambulatory Visit (HOSPITAL_COMMUNITY)
Admission: EM | Admit: 2018-07-17 | Discharge: 2018-07-17 | Disposition: A | Discharge status: Home / Self Care | Payer: Medicaid Other | Attending: Emergency Medicine | Admitting: Emergency Medicine

## 2018-07-17 ENCOUNTER — Encounter (HOSPITAL_COMMUNITY): Payer: Self-pay

## 2018-07-17 ENCOUNTER — Other Ambulatory Visit: Payer: Self-pay

## 2018-07-17 DIAGNOSIS — Z113 Encounter for screening for infections with a predominantly sexual mode of transmission: Secondary | ICD-10-CM | POA: Insufficient documentation

## 2018-07-17 DIAGNOSIS — R102 Pelvic and perineal pain: Secondary | ICD-10-CM | POA: Diagnosis present

## 2018-07-17 DIAGNOSIS — Z3202 Encounter for pregnancy test, result negative: Secondary | ICD-10-CM | POA: Diagnosis not present

## 2018-07-17 LAB — POCT URINALYSIS DIP (DEVICE)
Glucose, UA: NEGATIVE mg/dL
Hgb urine dipstick: NEGATIVE
Ketones, ur: NEGATIVE mg/dL
Leukocytes,Ua: NEGATIVE
Nitrite: NEGATIVE
Protein, ur: NEGATIVE mg/dL
Specific Gravity, Urine: >=1.03 (ref 1.005–1.030)
Urobilinogen, UA: 2 mg/dL — ABNORMAL HIGH (ref 0.0–1.0)
pH: 6 (ref 5.0–8.0)

## 2018-07-17 LAB — POCT PREGNANCY, URINE: Preg Test, Ur: NEGATIVE

## 2018-07-17 MED ORDER — NAPROXEN 500 MG PO TABS: 500.0000 mg | ORAL_TABLET | Freq: Two times a day (BID) | ORAL | 0 refills | Status: DC

## 2018-07-17 NOTE — ED Triage Notes (Signed)
Pt states she's having pelvis pain x 4 days. Pt wants to be tested for STD's.
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
## 2018-07-17 NOTE — Discharge Instructions (Addendum)
May use naprosyn as needed twice daily for pain, may try taking before bed  We are testing you for Gonorrhea, Chlamydia, Trichomonas, Yeast and Bacterial Vaginosis. We will call you if anything is positive and let you know if you require any further treatment. Please inform partners of any positive results.   Follow up with OBGYN if symptoms persisting and to update pap smear  Please return if symptoms not improving with treatment, development of fever, nausea, vomiting, abdominal pain.

## 2018-07-21 LAB — CERVICOVAGINAL ANCILLARY ONLY
Bacterial vaginitis: POSITIVE — AB
Candida vaginitis: NEGATIVE
Chlamydia: NEGATIVE
Neisseria Gonorrhea: NEGATIVE
Trichomonas: NEGATIVE

## 2018-07-23 ENCOUNTER — Telehealth: Payer: Self-pay | Admitting: Emergency Medicine

## 2018-07-23 MED ORDER — METRONIDAZOLE 500 MG PO TABS: 500.0000 mg | ORAL_TABLET | Freq: Two times a day (BID) | ORAL | 0 refills | Status: AC

## 2018-07-23 NOTE — Telephone Encounter (Signed)
Bacterial vaginosis is positive. This was not treated at the urgent care visit.  Flagyl 500 mg BID x 7 days #14 no refills sent to patients pharmacy of choice.    Attempted to reach patient. No answer at this time. Voicemail left.

## 2018-07-24 ENCOUNTER — Telehealth (HOSPITAL_COMMUNITY): Payer: Self-pay | Admitting: Emergency Medicine

## 2018-07-24 NOTE — Telephone Encounter (Signed)
Attempted to reach patient. No answer at this time. Voicemail left.    

## 2018-07-26 ENCOUNTER — Emergency Department (HOSPITAL_COMMUNITY): Payer: Medicaid Other

## 2018-07-26 ENCOUNTER — Emergency Department (HOSPITAL_COMMUNITY)
Admission: EM | Admit: 2018-07-26 | Discharge: 2018-07-26 | Disposition: A | Discharge status: Home / Self Care | Payer: Medicaid Other | Attending: Emergency Medicine | Admitting: Emergency Medicine

## 2018-07-26 ENCOUNTER — Encounter (HOSPITAL_COMMUNITY): Payer: Self-pay | Admitting: Emergency Medicine

## 2018-07-26 ENCOUNTER — Other Ambulatory Visit: Payer: Self-pay

## 2018-07-26 DIAGNOSIS — R112 Nausea with vomiting, unspecified: Secondary | ICD-10-CM | POA: Diagnosis not present

## 2018-07-26 DIAGNOSIS — R1084 Generalized abdominal pain: Secondary | ICD-10-CM | POA: Insufficient documentation

## 2018-07-26 DIAGNOSIS — F17210 Nicotine dependence, cigarettes, uncomplicated: Secondary | ICD-10-CM | POA: Diagnosis not present

## 2018-07-26 DIAGNOSIS — R109 Unspecified abdominal pain: Secondary | ICD-10-CM

## 2018-07-26 LAB — CBC WITH DIFFERENTIAL/PLATELET
Abs Immature Granulocytes: 0.01 10*3/uL (ref 0.00–0.07)
Basophils Absolute: 0 10*3/uL (ref 0.0–0.1)
Basophils Relative: 0 %
Eosinophils Absolute: 0.1 10*3/uL (ref 0.0–0.5)
Eosinophils Relative: 1 %
HCT: 38.9 % (ref 36.0–46.0)
Hemoglobin: 12.7 g/dL (ref 12.0–15.0)
Immature Granulocytes: 0 %
Lymphocytes Relative: 19 %
Lymphs Abs: 1.4 10*3/uL (ref 0.7–4.0)
MCH: 28.5 pg (ref 26.0–34.0)
MCHC: 32.6 g/dL (ref 30.0–36.0)
MCV: 87.2 fL (ref 80.0–100.0)
Monocytes Absolute: 0.6 10*3/uL (ref 0.1–1.0)
Monocytes Relative: 8 %
Neutro Abs: 5.4 10*3/uL (ref 1.7–7.7)
Neutrophils Relative %: 72 %
Platelets: 207 10*3/uL (ref 150–400)
RBC: 4.46 MIL/uL (ref 3.87–5.11)
RDW: 14.4 % (ref 11.5–15.5)
WBC: 7.5 10*3/uL (ref 4.0–10.5)
nRBC: 0 % (ref 0.0–0.2)

## 2018-07-26 LAB — COMPREHENSIVE METABOLIC PANEL
ALT: 17 U/L (ref 0–44)
AST: 23 U/L (ref 15–41)
Albumin: 4 g/dL (ref 3.5–5.0)
Alkaline Phosphatase: 33 U/L — ABNORMAL LOW (ref 38–126)
Anion gap: 8 (ref 5–15)
BUN: 6 mg/dL (ref 6–20)
CO2: 23 mmol/L (ref 22–32)
Calcium: 9.2 mg/dL (ref 8.9–10.3)
Chloride: 109 mmol/L (ref 98–111)
Creatinine, Ser: 0.97 mg/dL (ref 0.44–1.00)
GFR calc Af Amer: >60 mL/min (ref >60)
GFR calc non Af Amer: >60 mL/min (ref >60)
Glucose, Bld: 86 mg/dL (ref 70–99)
Potassium: 4.6 mmol/L (ref 3.5–5.1)
Sodium: 140 mmol/L (ref 135–145)
Total Bilirubin: 1.4 mg/dL — ABNORMAL HIGH (ref 0.3–1.2)
Total Protein: 6.2 g/dL — ABNORMAL LOW (ref 6.5–8.1)

## 2018-07-26 LAB — URINALYSIS, ROUTINE W REFLEX MICROSCOPIC
Bacteria, UA: NONE SEEN
Bilirubin Urine: NEGATIVE
Glucose, UA: NEGATIVE mg/dL
Ketones, ur: 20 mg/dL — AB
Leukocytes,Ua: NEGATIVE
Nitrite: NEGATIVE
Protein, ur: NEGATIVE mg/dL
Specific Gravity, Urine: >1.046 — ABNORMAL HIGH (ref 1.005–1.030)
pH: 6 (ref 5.0–8.0)

## 2018-07-26 LAB — LIPASE, BLOOD: Lipase: 24 U/L (ref 11–51)

## 2018-07-26 LAB — I-STAT BETA HCG BLOOD, ED (MC, WL, AP ONLY): I-stat hCG, quantitative: <5 m[IU]/mL (ref <5)

## 2018-07-26 MED ORDER — ONDANSETRON HCL 4 MG PO TABS: 4.0000 mg | ORAL_TABLET | Freq: Four times a day (QID) | ORAL | 0 refills | Status: DC

## 2018-07-26 MED ORDER — DICYCLOMINE HCL 20 MG PO TABS: 20.0000 mg | ORAL_TABLET | Freq: Two times a day (BID) | ORAL | 0 refills | Status: DC

## 2018-07-26 MED ORDER — IOPAMIDOL (ISOVUE-300) INJECTION 61%
100.0000 mL | Freq: Once | INTRAVENOUS | Status: AC | PRN
  Administered 2018-07-26: 13:00:00 100 mL via INTRAVENOUS

## 2018-07-26 MED ORDER — KETOROLAC TROMETHAMINE 15 MG/ML IJ SOLN: 15.0000 mg | Freq: Once | INTRAMUSCULAR | Status: DC

## 2018-07-26 MED ORDER — ONDANSETRON HCL 4 MG/2ML IJ SOLN
4.0000 mg | Freq: Once | INTRAMUSCULAR | Status: AC
  Administered 2018-07-26: 4 mg via INTRAVENOUS
  Filled 2018-07-26: qty 2

## 2018-07-26 NOTE — ED Triage Notes (Signed)
Pt arrives pov w/ co abd pain and lower back pain for the last 3-4 weeks. Pt states that her hernia mesh implant might be the problem. CO n/v. Pt takes depo for birthcontrol.

## 2018-07-26 NOTE — ED Notes (Signed)
Ginger ale and gram crackers given to pt.
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
## 2018-07-26 NOTE — ED Notes (Signed)
Pt accidentally dropped urine cup in toilet will try again.

## 2018-07-26 NOTE — ED Notes (Signed)
Pt to restroom now.

## 2018-07-26 NOTE — Discharge Instructions (Addendum)
You may have a low stomach bug.  This should be treated symptomatically with Zofran for nausea and Bentyl as needed for pain. Use Tylenol as needed for further pain control. Pick up the Flagyl from the pharmacy to treat your BV which was found at your urgent care visit. If your symptoms persist, follow-up with your primary care doctor. Return to the emergency room for any new, worsening, concerning symptoms.

## 2018-09-12 ENCOUNTER — Ambulatory Visit (HOSPITAL_COMMUNITY)
Admission: EM | Admit: 2018-09-12 | Discharge: 2018-09-12 | Disposition: A | Discharge status: Home / Self Care | Payer: Medicaid Other

## 2018-09-12 ENCOUNTER — Emergency Department (HOSPITAL_COMMUNITY)
Admission: EM | Admit: 2018-09-12 | Discharge: 2018-09-12 | Discharge status: Against Medical Advice | Payer: Medicaid Other

## 2018-09-12 ENCOUNTER — Encounter (HOSPITAL_COMMUNITY): Payer: Self-pay

## 2018-09-12 ENCOUNTER — Other Ambulatory Visit: Payer: Self-pay

## 2018-09-12 DIAGNOSIS — K029 Dental caries, unspecified: Secondary | ICD-10-CM | POA: Diagnosis not present

## 2018-09-12 DIAGNOSIS — K0889 Other specified disorders of teeth and supporting structures: Secondary | ICD-10-CM

## 2018-09-12 MED ORDER — KETOROLAC TROMETHAMINE 60 MG/2ML IM SOLN
60.0000 mg | Freq: Once | INTRAMUSCULAR | Status: AC
  Administered 2018-09-12: 60 mg via INTRAMUSCULAR

## 2018-09-12 MED ORDER — NAPROXEN 500 MG PO TABS: 500.0000 mg | ORAL_TABLET | Freq: Two times a day (BID) | ORAL | 0 refills | Status: DC

## 2018-09-12 MED ORDER — KETOROLAC TROMETHAMINE 60 MG/2ML IM SOLN
INTRAMUSCULAR | Status: AC
  Filled 2018-09-12: qty 2

## 2018-09-12 MED ORDER — CLINDAMYCIN HCL 300 MG PO CAPS: 300.0000 mg | ORAL_CAPSULE | Freq: Three times a day (TID) | ORAL | 0 refills | Status: DC

## 2018-09-12 NOTE — ED Provider Notes (Signed)
MRN: [id number] DOB: [birth year]  Subjective:   CAIDYNCE HRUSKA is a 28 y.o. female presenting for acute onset of recurrent left-sided facial and dental pain.  Patient states that she has seen her own dentist and has undergone a few rounds of amoxicillin.  Unfortunately she has been informed that they are not able to do surgery for her teeth.  She does not have any other dentist she is consulted with.  She is tried ibuprofen with minimal relief.   Does not take any chronic medications.     Allergies  Allergen Reactions  . Bee Venom Anaphylaxis  . Other Anaphylaxis, Swelling and Rash    Tomatoes.    Past Medical History:  Diagnosis Date  . Abnormal uterine bleeding (AUB) 05/28/2014  . Bronchitis   . BV (bacterial vaginosis) 12/10/2012  . Contraceptive education 07/16/2013  . Contraceptive management 04/21/2015  . Decreased appetite 05/28/2014  . Dyspareunia 12/10/2012  . Gonorrhea affecting pregnancy in first trimester 05/04/2016   Treated in ER 5/1  . Hematuria 07/06/2015  . History of chlamydia   . History of PID 07/16/2013  . Irregular intermenstrual bleeding 07/06/2015  . Irregular periods 01/18/2015  . Migraine   . Nausea 01/18/2015  . Nexplanon in place 07/06/2014  . Nexplanon insertion 08/27/2013  . Pain with urination 07/06/2015  . PID (pelvic inflammatory disease)   . RUQ pain 07/16/2013  . Strain of rectus abdominis muscle 06/10/2014  . Umbilical hernia [id number]  . UTI (lower urinary tract infection)   . Vaginal discharge 12/10/2012  . Weight loss 07/06/2014  . Wheezing on expiration 12/09/2014  . Yeast infection 07/16/2013     Past Surgical History:  Procedure Laterality Date  . CESAREAN SECTION  12/01/2016  . DILATION AND CURETTAGE OF UTERUS N/A 03/18/2012   Procedure: DILATATION AND CURETTAGE;  Surgeon: Mora Bellman, MD;  Location: [facility];  Service: Gynecology;  Laterality: N/A;  . INSERTION OF MESH N/A 08/18/2014   Procedure: INSERTION OF MESH;  Surgeon: [name], MD;   Location: [facility];  Service: General;  Laterality: N/A;  . tooth pulled    . UMBILICAL HERNIA REPAIR N/A 08/18/2014   Procedure: UMBILICAL HERNIORRHAPHY;  Surgeon: [name], MD;  Location: [facility];  Service: General;  Laterality: N/A;  . WISDOM TOOTH EXTRACTION      ROS  Objective:   Vitals: BP (!) 143/96 (BP Location: Left Arm)   Pulse 100   Resp 16   LMP 02/03/2018   SpO2 99%   Physical Exam Constitutional:      General: She is not in acute distress.    Appearance: She is well-developed. She is not ill-appearing.  HENT:     Head: Normocephalic and atraumatic.      Right Ear: Tympanic membrane and ear canal normal. No drainage or tenderness. No middle ear effusion. Tympanic membrane is not erythematous.     Left Ear: Tympanic membrane and ear canal normal. No drainage or tenderness.  No middle ear effusion. Tympanic membrane is not erythematous.     Nose: No congestion or rhinorrhea.     Mouth/Throat:     Mouth: Mucous membranes are moist. No oral lesions.     Pharynx: Oropharynx is clear. No pharyngeal swelling, oropharyngeal exudate, posterior oropharyngeal erythema or uvula swelling.     Tonsils: No tonsillar exudate or tonsillar abscesses.   Eyes:     Extraocular Movements:     Right eye: Normal extraocular motion.     Left eye: Normal extraocular  motion.     Conjunctiva/sclera: Conjunctivae normal.     Pupils: Pupils are equal, round, and reactive to light.  Neck:     Musculoskeletal: Normal range of motion and neck supple.  Cardiovascular:     Rate and Rhythm: Normal rate.  Pulmonary:     Effort: Pulmonary effort is normal.  Lymphadenopathy:     Cervical: No cervical adenopathy.  Skin:    General: Skin is warm and dry.  Neurological:     General: No focal deficit present.     Mental Status: She is alert and oriented to person, place, and time.  Psychiatric:        Mood and Affect: Mood normal.        Behavior: Behavior normal.      Assessment and  Plan :   1. Pain, dental   2. Dental caries     Patient is to start clindamycin for possible odontogenic infection.  Use Naprosyn for pain control, IM Toradol given in clinic.  Emphasized need to contact different dental practices, information provided to her. Counseled patient on potential for adverse effects with medications prescribed/recommended today, ER and return-to-clinic precautions discussed, patient verbalized understanding.    Jaynee Eagles, [place] 09/12/18 1841

## 2018-09-12 NOTE — ED Triage Notes (Addendum)
Patient reports she woke up with pain in her left side of the face, it was swollen,  she alternated cold and hot compresses she also have denta lpain and headache.

## 2018-09-12 NOTE — Discharge Instructions (Signed)
[facility] [phone] extension [phone]  Dr. Civils [phone] [address]  [facility] [phone] [address]  [facility] [phone] extension [phone] [place], [place], [address] First come first serve for the first 10 clients.  May do simple extractions only, no wisdom teeth or surgery.  You may try the second for Thursday of the month starting at 6:30 AM.  [employer] You may call the school to see if they are still helping to provide dental care for emergent cases.  

## 2018-09-23 ENCOUNTER — Ambulatory Visit (HOSPITAL_COMMUNITY)
Admission: EM | Admit: 2018-09-23 | Discharge: 2018-09-23 | Disposition: A | Discharge status: Home / Self Care | Payer: Medicaid Other | Attending: Family Medicine | Admitting: Family Medicine

## 2018-09-23 ENCOUNTER — Other Ambulatory Visit: Payer: Self-pay

## 2018-09-23 ENCOUNTER — Encounter (HOSPITAL_COMMUNITY): Payer: Self-pay | Admitting: Emergency Medicine

## 2018-09-23 DIAGNOSIS — Z202 Contact with and (suspected) exposure to infections with a predominantly sexual mode of transmission: Secondary | ICD-10-CM

## 2018-09-23 MED ORDER — AZITHROMYCIN 250 MG PO TABS
1000.0000 mg | ORAL_TABLET | Freq: Once | ORAL | Status: AC
  Administered 2018-09-23: 21:00:00 1000 mg via ORAL

## 2018-09-23 MED ORDER — CEFTRIAXONE SODIUM 250 MG IJ SOLR
INTRAMUSCULAR | Status: AC
  Filled 2018-09-23: qty 250

## 2018-09-23 MED ORDER — CEFTRIAXONE SODIUM 250 MG IJ SOLR
250.0000 mg | Freq: Once | INTRAMUSCULAR | Status: AC
  Administered 2018-09-23: 250 mg via INTRAMUSCULAR

## 2018-09-23 MED ORDER — AZITHROMYCIN 250 MG PO TABS
ORAL_TABLET | ORAL | Status: AC
  Filled 2018-09-23: qty 4

## 2018-09-23 NOTE — Discharge Instructions (Signed)
You have been given the following medications today for treatment of suspected gonorrhea and/or chlamydia:  cefTRIAXone (ROCEPHIN) injection 250 mg azithromycin (ZITHROMAX) tablet 1,000 mg  Even though we have treated you today, we have sent testing for sexually transmitted infections. We will notify you of any positive results once they are received. If required, we will prescribe any medications you might need.  Please refrain from all sexual activity for at least the next seven days.

## 2018-09-23 NOTE — ED Triage Notes (Signed)
Pt here for STD testing. A partner called and told her someone else he has been intimate with told him she has trich, chlamydia, gonorrhea, and herpes. PT has no symptoms.

## 2018-09-24 ENCOUNTER — Other Ambulatory Visit: Payer: Self-pay | Admitting: Family Medicine

## 2018-09-24 LAB — CERVICOVAGINAL ANCILLARY ONLY
Bacterial Vaginitis (gardnerella): POSITIVE — AB
Candida Glabrata: NEGATIVE
Candida Vaginitis: NEGATIVE
Molecular Disclaimer: NEGATIVE
Molecular Disclaimer: NEGATIVE
Molecular Disclaimer: NEGATIVE
Molecular Disclaimer: NORMAL
Trichomonas: NEGATIVE

## 2018-09-24 LAB — HIV ANTIBODY (ROUTINE TESTING W REFLEX): HIV Screen 4th Generation wRfx: NONREACTIVE

## 2018-09-24 LAB — RPR: RPR Ser Ql: NONREACTIVE

## 2018-09-25 ENCOUNTER — Telehealth (HOSPITAL_COMMUNITY): Payer: Self-pay | Admitting: Emergency Medicine

## 2018-09-25 LAB — CERVICOVAGINAL ANCILLARY ONLY
Chlamydia: NEGATIVE
Neisseria Gonorrhea: NEGATIVE

## 2018-09-25 MED ORDER — METRONIDAZOLE 500 MG PO TABS: 500.0000 mg | ORAL_TABLET | Freq: Two times a day (BID) | ORAL | 0 refills | Status: AC

## 2018-09-25 NOTE — Telephone Encounter (Signed)
Bacterial vaginosis is positive. This was not treated at the urgent care visit.  Flagyl 500 mg BID x 7 days #14 no refills sent to patients pharmacy of choice.    Patient contacted and made aware of    results, all questions answered   

## 2018-09-29 NOTE — ED Provider Notes (Signed)
[facility]   [id number] 09/23/18 Arrival Time: 1947  ASSESSMENT & PLAN:  1. Possible exposure to STD     No signs or symptoms of PID. Benign abdomen.   Discharge Instructions     You have been given the following medications today for treatment of suspected gonorrhea and/or chlamydia:  cefTRIAXone (ROCEPHIN) injection 250 mg azithromycin (ZITHROMAX) tablet 1,000 mg  Even though we have treated you today, we have sent testing for sexually transmitted infections. We will notify you of any positive results once they are received. If required, we will prescribe any medications you might need.  Please refrain from all sexual activity for at least the next seven days.     Pending: Labs Reviewed  CERVICOVAGINAL ANCILLARY ONLY - Abnormal; Notable for the following components:      Result Value   Bacterial Vaginitis (gardnerella) Positive (*)    All other components within normal limits  HIV ANTIBODY (ROUTINE TESTING W REFLEX)  RPR    Will notify of any positive results. Instructed to refrain from sexual activity for at least seven days.  Reviewed expectations re: course of current medical issues. Questions answered. Outlined signs and symptoms indicating need for more acute intervention. Patient verbalized understanding. After Visit Summary given.   SUBJECTIVE:  Lori Johnston is a 28 y.o. female who reports possible exposure to STD. Questions very slight vaginal discharge; clear/white. First noticed yesterday. Urinary symptoms: none. Afebrile. No abdominal or pelvic pain. No n/v. No rashes or lesions. Reports that @SHE @ is sexually active with single female partner. OTC treatment: none. History of STI: none reported.  Patient's last menstrual period was 07/25/2017.  ROS: As per HPI. All other systems negative.  OBJECTIVE:  Vitals:   09/23/18 2009  BP: 100/68  Pulse: 85  Resp: 16  Temp: 98.2 F (36.8 C)  TempSrc: Temporal  SpO2: 98%     General  appearance: alert, cooperative, appears stated age and no distress Throat: lips, mucosa, and tongue normal; teeth and gums normal CV: RRR Lungs: CTAB Back: no CVA tenderness; FROM at waist Abdomen: soft, non-tender GU: deferred Skin: warm and dry Psychological: alert and cooperative; normal mood and affect.    Allergies  Allergen Reactions  . Bee Venom Anaphylaxis  . Other Anaphylaxis, Swelling and Rash    Tomatoes.    Past Medical History:  Diagnosis Date  . Abnormal uterine bleeding (AUB) 05/28/2014  . Bronchitis   . BV (bacterial vaginosis) 12/10/2012  . Contraceptive education 07/16/2013  . Contraceptive management 04/21/2015  . Decreased appetite 05/28/2014  . Dyspareunia 12/10/2012  . Gonorrhea affecting pregnancy in first trimester 05/04/2016   Treated in ER 5/1  . Hematuria 07/06/2015  . History of chlamydia   . History of PID 07/16/2013  . Irregular intermenstrual bleeding 07/06/2015  . Irregular periods 01/18/2015  . Migraine   . Nausea 01/18/2015  . Nexplanon in place 07/06/2014  . Nexplanon insertion 08/27/2013  . Pain with urination 07/06/2015  . PID (pelvic inflammatory disease)   . RUQ pain 07/16/2013  . Strain of rectus abdominis muscle 06/10/2014  . Umbilical hernia Q000111Q  . UTI (lower urinary tract infection)   . Vaginal discharge 12/10/2012  . Weight loss 07/06/2014  . Wheezing on expiration 12/09/2014  . Yeast infection 07/16/2013   Family History  Problem Relation Age of Onset  . Diabetes Cousin   . Ulcers Cousin   . Deafness Paternal Grandmother   . Migraines Paternal Grandmother   . Migraines Mother   .  Migraines Sister   . Migraines Sister   . Heart attack Other        maternal great great grandma  . Mental illness Maternal Aunt   . Other Daughter        omphalecle  . Anesthesia problems Neg Hx   . Hypotension Neg Hx   . Malignant hyperthermia Neg Hx   . Pseudochol deficiency Neg Hx   . Colon cancer Neg Hx    Social History   Socioeconomic  History  . Marital status: Single    Spouse name: Not on file  . Number of children: Not on file  . Years of education: Not on file  . Highest education level: Not on file  Occupational History  . Not on file  Social Needs  . Financial resource strain: Not on file  . Food insecurity    Worry: Not on file    Inability: Not on file  . Transportation needs    Medical: Not on file    Non-medical: Not on file  Tobacco Use  . Smoking status: Current Every Day Smoker    Packs/day: 0.20    Years: 2.00    Pack years: 0.40    Types: Cigarettes    Last attempt to quit: 01/02/2016    Years since quitting: 2.7  . Smokeless tobacco: Never Used  . Tobacco comment: Smokes black and milds last use 03/11/2018  Substance and Sexual Activity  . Alcohol use: Not Currently    Alcohol/week: 1.0 standard drinks    Types: 1 Glasses of wine per week    Comment: rarely (holidays and family events)  . Drug use: Yes    Types: Marijuana    Comment: Last use 02/09/2018  . Sexual activity: Yes    Birth control/protection: None    Comment: hx chlamydia during 1st preg; was treated  Lifestyle  . Physical activity    Days per week: Not on file    Minutes per session: Not on file  . Stress: Not on file  Relationships  . Social Herbalist on phone: Not on file    Gets together: Not on file    Attends religious service: Not on file    Active member of club or organization: Not on file    Attends meetings of clubs or organizations: Not on file    Relationship status: Not on file  . Intimate partner violence    Fear of current or ex partner: Not on file    Emotionally abused: Not on file    Physically abused: Not on file    Forced sexual activity: Not on file  Other Topics Concern  . Not on file  Social History Narrative  . Not on file          Vanessa Kick, MD 09/29/18 [phone]

## 2018-11-13 ENCOUNTER — Other Ambulatory Visit: Payer: Self-pay | Admitting: Oral Surgery

## 2019-01-05 ENCOUNTER — Encounter (HOSPITAL_COMMUNITY): Payer: Self-pay

## 2019-01-05 ENCOUNTER — Ambulatory Visit (HOSPITAL_COMMUNITY)
Admission: EM | Admit: 2019-01-05 | Discharge: 2019-01-05 | Disposition: A | Discharge status: Home / Self Care | Payer: Medicaid Other | Attending: Family Medicine | Admitting: Family Medicine

## 2019-01-05 ENCOUNTER — Other Ambulatory Visit: Payer: Self-pay

## 2019-01-05 DIAGNOSIS — Z113 Encounter for screening for infections with a predominantly sexual mode of transmission: Secondary | ICD-10-CM | POA: Insufficient documentation

## 2019-01-05 DIAGNOSIS — Z202 Contact with and (suspected) exposure to infections with a predominantly sexual mode of transmission: Secondary | ICD-10-CM

## 2019-01-05 DIAGNOSIS — Z3202 Encounter for pregnancy test, result negative: Secondary | ICD-10-CM | POA: Insufficient documentation

## 2019-01-05 LAB — HIV ANTIBODY (ROUTINE TESTING W REFLEX): HIV Screen 4th Generation wRfx: NONREACTIVE

## 2019-01-05 LAB — POCT PREGNANCY, URINE: Preg Test, Ur: NEGATIVE

## 2019-01-05 LAB — POC URINE PREG, ED: Preg Test, Ur: NEGATIVE

## 2019-01-05 NOTE — Discharge Instructions (Addendum)
Negative for pregnancy today.  Will notify you of any positive findings and if any changes to treatment are needed.  You may monitor your results on your MyChart online as well.   Use of condoms are recommended to prevent std's.
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
## 2019-01-05 NOTE — ED Triage Notes (Signed)
Pt presents for STD Testing and pregnancy test; pt states she is not having any symptoms and does not have a missed period.

## 2019-01-06 LAB — RPR: RPR Ser Ql: NONREACTIVE

## 2019-01-07 ENCOUNTER — Telehealth (HOSPITAL_COMMUNITY): Payer: Self-pay | Admitting: Emergency Medicine

## 2019-01-07 LAB — CERVICOVAGINAL ANCILLARY ONLY
Bacterial vaginitis: POSITIVE — AB
Candida vaginitis: NEGATIVE
Chlamydia: NEGATIVE
Neisseria Gonorrhea: NEGATIVE
Trichomonas: POSITIVE — AB

## 2019-01-07 MED ORDER — METRONIDAZOLE 500 MG PO TABS: 500.0000 mg | ORAL_TABLET | Freq: Two times a day (BID) | ORAL | 0 refills | Status: AC

## 2019-01-07 NOTE — Telephone Encounter (Signed)
Bacterial vaginosis is positive. Pt needs treatment. Flagyl 500 mg BID x 7 days #14 no refills sent to patients pharmacy of choice.    Trichomonas is positive. Rx  for Flagyl was sent to the pharmacy of record. Pt needs education to refrain from sexual intercourse for 7 days to give the medicine time to work. Sexual partners need to be notified and tested/treated. Condoms may reduce risk of reinfection. Recheck for further evaluation if symptoms are not improving.   Patient contacted by phone and made aware of    results. Pt verbalized understanding and had all questions answered.

## 2019-01-28 ENCOUNTER — Telehealth: Payer: Self-pay | Admitting: Emergency Medicine

## 2019-01-28 NOTE — Telephone Encounter (Signed)
Attempted to call patient for follow-up questions with no response.  Left voicemail for return call

## 2019-06-12 ENCOUNTER — Ambulatory Visit (INDEPENDENT_AMBULATORY_CARE_PROVIDER_SITE_OTHER): Payer: Medicaid Other | Admitting: Women's Health

## 2019-06-12 ENCOUNTER — Encounter: Payer: Self-pay | Admitting: Women's Health

## 2019-06-12 VITALS — BP 110/76 | HR 84 | Ht 68.0 in | Wt 132.5 lb

## 2019-06-12 DIAGNOSIS — Z3202 Encounter for pregnancy test, result negative: Secondary | ICD-10-CM | POA: Diagnosis not present

## 2019-06-12 DIAGNOSIS — N921 Excessive and frequent menstruation with irregular cycle: Secondary | ICD-10-CM | POA: Diagnosis not present

## 2019-06-12 LAB — POCT URINE PREGNANCY: Preg Test, Ur: NEGATIVE

## 2019-06-12 NOTE — Progress Notes (Signed)
GYN VISIT Patient name: Lori Johnston MRN [id number]  Date of birth: 1990-03-22 Chief Complaint:   currently on Depo (having abnormal bleeding)  History of Present Illness:   Lori Johnston is a 29 y.o. [phone] African American female being seen today for irregular periods.  On depo since last July, hasn't had a period since until 5/10-5/20, stopped 5/21-5/25, restarted 5/26 and just stopped yesterday when she took pills given by PCP to help stop bleeding. Reports she went to urgent care in [facility] or Tues of this week and they 'checked everything', all STDs neg. Did have +trichomonas 01/05/19. Not sexually active. Depo shot runs out today per her. She does not want any other birth control right now, just want to see how her body does off of birth control. Will let [place] know if she changes her mind and wants to try something else. Discussed all options, states she did well w/ Nexplanon in the past and would probably want that.  Depression screen [facility] 2/9 06/12/2019 06/05/2016 04/11/2016  Decreased Interest 0 1 0  Down, Depressed, Hopeless 0 0 0  PHQ - 2 Score 0 1 0  Altered sleeping 0 0 -  Tired, decreased energy 0 1 -  Change in appetite 1 1 -  Feeling bad or failure about yourself  1 0 -  Trouble concentrating 0 0 -  Moving slowly or fidgety/restless 0 0 -  Suicidal thoughts 0 0 -  PHQ-9 Score 2 3 -  Difficult doing work/chores Not difficult at all - -    Patient's last menstrual period was 05/11/2019. The current method of family planning is abstinence.  Last pap 2018 per our records, states she has had one since w/ Eulogio Bear at [place]. Results were:  normal Review of Systems:   Pertinent items are noted in HPI Denies fever/chills, dizziness, headaches, visual disturbances, fatigue, shortness of breath, chest pain, abdominal pain, vomiting, abnormal vaginal discharge/itching/odor/irritation, problems with periods, bowel movements, urination, or intercourse unless otherwise stated above.    Pertinent History Reviewed:  Reviewed past medical,surgical, social, obstetrical and family history.  Reviewed problem list, medications and allergies. Physical Assessment:   Vitals:   06/12/19 1059  BP: 110/76  Pulse: 84  Weight: 132 lb 8 oz (60.1 kg)  Height: 5\' 8"  (1.727 m)  Body mass index is 20.15 kg/m.       Physical Examination:   General appearance: alert, well appearing, and in no distress  Mental status: alert, oriented to person, place, and time  Skin: warm & dry   Cardiovascular: normal heart rate noted  Respiratory: normal respiratory effort, no distress  Abdomen: soft, non-tender   Pelvic: examination not indicated  Extremities: no edema   Chaperone: n/a    Results for orders placed or performed in visit on 06/12/19 (from the past 24 hour(s))  POCT urine pregnancy   Collection Time: 06/12/19 11:04 AM  Result Value Ref Range   Preg Test, Ur Negative Negative    Assessment & Plan:  1) BTB on depo> reports neg STD screen this week at urgent care in [place]-to sign release to get these results, had +trichomonas in Jan of this year. Currently not bleeding, wants to come off of depo and not get on any other birth control method right now as she is not sexually active and does not plan to be. If does, would be Nexplanon and will call us back if she decides for this.   2) Last pap in our  records 2018, reports having one since at PCP>will sign release to get these records as well  Meds: No orders of the defined types were placed in this encounter.   Orders Placed This Encounter  Procedures   POCT urine pregnancy    Return for get pap from [place] and STD screen from urgent care [place].  [place], [facility] 06/12/2019 12:14 PM

## 2019-06-20 ENCOUNTER — Encounter (HOSPITAL_COMMUNITY): Payer: Self-pay | Admitting: Emergency Medicine

## 2019-06-20 ENCOUNTER — Other Ambulatory Visit: Payer: Self-pay

## 2019-06-20 ENCOUNTER — Emergency Department (HOSPITAL_COMMUNITY): Payer: Medicaid Other

## 2019-06-20 ENCOUNTER — Emergency Department (HOSPITAL_COMMUNITY)
Admission: EM | Admit: 2019-06-20 | Discharge: 2019-06-20 | Disposition: A | Discharge status: Home / Self Care | Payer: Medicaid Other | Attending: Emergency Medicine | Admitting: Emergency Medicine

## 2019-06-20 DIAGNOSIS — Z711 Person with feared health complaint in whom no diagnosis is made: Secondary | ICD-10-CM

## 2019-06-20 DIAGNOSIS — N390 Urinary tract infection, site not specified: Secondary | ICD-10-CM | POA: Insufficient documentation

## 2019-06-20 DIAGNOSIS — R102 Pelvic and perineal pain: Secondary | ICD-10-CM | POA: Diagnosis present

## 2019-06-20 DIAGNOSIS — Z87891 Personal history of nicotine dependence: Secondary | ICD-10-CM | POA: Diagnosis not present

## 2019-06-20 DIAGNOSIS — R1032 Left lower quadrant pain: Secondary | ICD-10-CM

## 2019-06-20 DIAGNOSIS — Z202 Contact with and (suspected) exposure to infections with a predominantly sexual mode of transmission: Secondary | ICD-10-CM | POA: Diagnosis not present

## 2019-06-20 LAB — CBC
HCT: 39.2 % (ref 36.0–46.0)
Hemoglobin: 12.5 g/dL (ref 12.0–15.0)
MCH: 27.9 pg (ref 26.0–34.0)
MCHC: 31.9 g/dL (ref 30.0–36.0)
MCV: 87.5 fL (ref 80.0–100.0)
Platelets: 214 10*3/uL (ref 150–400)
RBC: 4.48 MIL/uL (ref 3.87–5.11)
RDW: 14.4 % (ref 11.5–15.5)
WBC: 7.3 10*3/uL (ref 4.0–10.5)
nRBC: 0 % (ref 0.0–0.2)

## 2019-06-20 LAB — COMPREHENSIVE METABOLIC PANEL
ALT: 16 U/L (ref 0–44)
AST: 21 U/L (ref 15–41)
Albumin: 3.8 g/dL (ref 3.5–5.0)
Alkaline Phosphatase: 35 U/L — ABNORMAL LOW (ref 38–126)
Anion gap: 6 (ref 5–15)
BUN: 11 mg/dL (ref 6–20)
CO2: 26 mmol/L (ref 22–32)
Calcium: 9 mg/dL (ref 8.9–10.3)
Chloride: 110 mmol/L (ref 98–111)
Creatinine, Ser: 0.85 mg/dL (ref 0.44–1.00)
GFR calc Af Amer: >60 mL/min (ref >60)
GFR calc non Af Amer: >60 mL/min (ref >60)
Glucose, Bld: 122 mg/dL — ABNORMAL HIGH (ref 70–99)
Potassium: 4.2 mmol/L (ref 3.5–5.1)
Sodium: 142 mmol/L (ref 135–145)
Total Bilirubin: 0.9 mg/dL (ref 0.3–1.2)
Total Protein: 6.3 g/dL — ABNORMAL LOW (ref 6.5–8.1)

## 2019-06-20 LAB — I-STAT BETA HCG BLOOD, ED (MC, WL, AP ONLY): I-stat hCG, quantitative: <5 m[IU]/mL (ref <5)

## 2019-06-20 LAB — URINALYSIS, ROUTINE W REFLEX MICROSCOPIC
Bilirubin Urine: NEGATIVE
Glucose, UA: NEGATIVE mg/dL
Ketones, ur: NEGATIVE mg/dL
Nitrite: NEGATIVE
Protein, ur: NEGATIVE mg/dL
Specific Gravity, Urine: 1.023 (ref 1.005–1.030)
pH: 6 (ref 5.0–8.0)

## 2019-06-20 LAB — WET PREP, GENITAL
Sperm: NONE SEEN
Trich, Wet Prep: NONE SEEN
Yeast Wet Prep HPF POC: NONE SEEN

## 2019-06-20 LAB — LIPASE, BLOOD: Lipase: 21 U/L (ref 11–51)

## 2019-06-20 MED ORDER — CEPHALEXIN 500 MG PO CAPS: 500.0000 mg | ORAL_CAPSULE | Freq: Two times a day (BID) | ORAL | 0 refills | Status: AC

## 2019-06-20 MED ORDER — KETOROLAC TROMETHAMINE 30 MG/ML IJ SOLN
15.0000 mg | Freq: Once | INTRAMUSCULAR | Status: AC
  Administered 2019-06-20: 15 mg via INTRAVENOUS
  Filled 2019-06-20: qty 1

## 2019-06-20 MED ORDER — SODIUM CHLORIDE 0.9% FLUSH: 3.0000 mL | Freq: Once | INTRAVENOUS | Status: DC

## 2019-06-20 MED ORDER — SODIUM CHLORIDE 0.9 % IV BOLUS
1000.0000 mL | Freq: Once | INTRAVENOUS | Status: AC
  Administered 2019-06-20: 1000 mL via INTRAVENOUS

## 2019-06-20 MED ORDER — NAPROXEN 500 MG PO TABS
500.0000 mg | ORAL_TABLET | Freq: Two times a day (BID) | ORAL | 0 refills | Status: DC
Start: 2019-06-20 — End: 2019-07-09
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
## 2019-06-20 NOTE — Discharge Instructions (Addendum)
Take the antibiotics as directed. Take the naproxen as needed to help with pain. We will contact you with the results of your remaining lab work when it is available. Return to the ER if you develop worsening abdominal pain, abnormal vaginal bleeding, lightheadedness, chest pain or shortness of breath.

## 2019-06-20 NOTE — ED Triage Notes (Signed)
Pt reports vaginal bleeding that started May 10th.  Seen at [facility] and treated for STD.  Seen by PCP and received "pills" to make her vaginal bleeding stop.  States she stopped bleeding 3 days ago.  Reports L sided abd pain since the vaginal bleeding started in May that she continues to have after the bleeding stopped.  Also reports intermittent dizziness.

## 2019-06-20 NOTE — ED Notes (Signed)
Patient verbalizes understanding of discharge instructions. Opportunity for questioning and answers were provided. Armband removed by staff, pt discharged from ED ambulatory to home.  

## 2019-06-22 LAB — GC/CHLAMYDIA PROBE AMP (~~LOC~~) NOT AT ARMC
Chlamydia: NEGATIVE
Comment: NEGATIVE
Comment: NORMAL
Neisseria Gonorrhea: NEGATIVE

## 2019-07-09 ENCOUNTER — Ambulatory Visit (INDEPENDENT_AMBULATORY_CARE_PROVIDER_SITE_OTHER): Payer: Medicaid Other | Admitting: Women's Health

## 2019-07-09 ENCOUNTER — Encounter: Payer: Self-pay | Admitting: Women's Health

## 2019-07-09 ENCOUNTER — Other Ambulatory Visit (HOSPITAL_COMMUNITY)
Admission: RE | Admit: 2019-07-09 | Discharge: 2019-07-09 | Disposition: A | Discharge status: Home / Self Care | Payer: Medicaid Other | Source: Ambulatory Visit | Attending: Obstetrics and Gynecology | Admitting: Obstetrics and Gynecology

## 2019-07-09 VITALS — BP 117/74 | HR 95 | Ht 68.0 in | Wt 138.0 lb

## 2019-07-09 DIAGNOSIS — Z30013 Encounter for initial prescription of injectable contraceptive: Secondary | ICD-10-CM

## 2019-07-09 DIAGNOSIS — Z01419 Encounter for gynecological examination (general) (routine) without abnormal findings: Secondary | ICD-10-CM

## 2019-07-09 DIAGNOSIS — N926 Irregular menstruation, unspecified: Secondary | ICD-10-CM

## 2019-07-09 MED ORDER — MEDROXYPROGESTERONE ACETATE 150 MG/ML IM SUSY: 150.0000 mg | PREFILLED_SYRINGE | INTRAMUSCULAR | 3 refills | Status: DC

## 2019-07-09 MED ORDER — MEDROXYPROGESTERONE ACETATE 150 MG/ML IM SUSP
150.0000 mg | Freq: Once | INTRAMUSCULAR | Status: AC
  Administered 2019-07-09: 150 mg via INTRAMUSCULAR

## 2019-07-09 MED ORDER — METRONIDAZOLE 500 MG PO TABS
500.0000 mg | ORAL_TABLET | Freq: Two times a day (BID) | ORAL | 0 refills | Status: DC
Start: 2019-07-09 — End: 2020-11-29

## 2019-07-09 NOTE — Progress Notes (Signed)
INJECTION  SUBJECTIVE:  Lori Johnston is a 29 y.o. [phone] female here for a Depo Provera for contraception/period management. She is a GYN patient.   OBJECTIVE:  BP 117/74 (BP Location: Right Arm, Patient Position: Sitting, Cuff Size: Normal)   Pulse 95   Ht 5\' 8"  (1.727 m)   Wt 138 lb (62.6 kg)   BMI 20.98 kg/m   Appears well, in no apparent distress  Injection administered in: Right upper quad. gluteus  Meds ordered this encounter  Medications  . metroNIDAZOLE (FLAGYL) 500 MG tablet    Sig: Take 1 tablet (500 mg total) by mouth 2 (two) times daily.    Dispense:  14 tablet    Refill:  0    Order Specific Question:   Supervising Provider    Answer:   Elonda Husky, LUTHER H [[id number]]  . medroxyPROGESTERone Acetate (DEPO-PROVERA) 150 MG/ML SUSY    Sig: Inject 1 mL (150 mg total) into the muscle every 3 (three) months.    Dispense:  0.9 mL    Refill:  3    Order Specific Question:   Supervising Provider    Answer:   EURE, LUTHER H [[id number]]  . medroxyPROGESTERone (DEPO-PROVERA) injection 150 mg    ASSESSMENT: GYN patient Depo Provera for contraception/period management PLAN: Follow-up: in 11-13 weeks for next Depo   [name]  07/09/2019 4:35 PM

## 2019-07-09 NOTE — Addendum Note (Signed)
Addended by: Octaviano Glow on: 07/09/2019 04:39 PM   Modules accepted: Orders

## 2019-07-09 NOTE — Progress Notes (Signed)
WELL-WOMAN EXAMINATION Patient name: Lori Johnston MRN [id number]  Date of birth: 1990/07/23 Chief Complaint:   Discuss bleeding  History of Present Illness:   Lori Johnston is a 29 y.o. [phone] African American female being seen today for a routine well-woman exam.  Current complaints: wants to restart depo. Went to ED 6/19 w/ abdominal pain, had neg work-up including neg gc/ct, normal pelvic u/s, wet prep did show clue cells, but pt was asymptomatic so not tx. Has been having some irregular bleeding, wanted to stay off of depo for a little bit to see if it was from depo, but now wants to resume depo. Brought her depo w/ her. Currently denies vaginal odor/itching/irritation.   Depression screen [facility] 2/9 06/12/2019 06/05/2016 04/11/2016  Decreased Interest 0 1 0  Down, Depressed, Hopeless 0 0 0  PHQ - 2 Score 0 1 0  Altered sleeping 0 0 -  Tired, decreased energy 0 1 -  Change in appetite 1 1 -  Feeling bad or failure about yourself  1 0 -  Trouble concentrating 0 0 -  Moving slowly or fidgety/restless 0 0 -  Suicidal thoughts 0 0 -  PHQ-9 Score 2 3 -  Difficult doing work/chores Not difficult at all - -     PCP: Larene Pickett      does not desire labs No LMP recorded. Patient has had an injection. The current method of family planning is none and wants to resume depo.  Last pap initially thought it was recently w/ PCP, however they have no record of pap, so was 06/05/2016. Results were: normal. H/O abnormal pap: no Last mammogram: never. Results were: N/A. Family h/o breast cancer: no Last colonoscopy: never. Results were: N/A. Family h/o colorectal cancer: no Review of Systems:   Pertinent items are noted in HPI Denies any headaches, blurred vision, fatigue, shortness of breath, chest pain, abdominal pain, abnormal vaginal discharge/itching/odor/irritation, problems with periods, bowel movements, urination, or intercourse unless otherwise stated above. Pertinent History Reviewed:  Reviewed  past medical,surgical, social and family history.  Reviewed problem list, medications and allergies. Physical Assessment:   Vitals:   07/09/19 1452  BP: 117/74  Pulse: 95  Weight: 138 lb (62.6 kg)  Height: 5\' 8"  (1.727 m)  Body mass index is 20.98 kg/m.        Physical Examination:   General appearance - well appearing, and in no distress  Mental status - alert, oriented to person, place, and time  Psych:  She has a normal mood and affect  Skin - warm and dry, normal color, no suspicious lesions noted  Chest - effort normal, all lung fields clear to auscultation bilaterally  Heart - normal rate and regular rhythm  Neck:  midline trachea, no thyromegaly or nodules  Breasts - breasts appear normal, no suspicious masses, no skin or nipple changes or  axillary nodes  Abdomen - soft, nontender, nondistended, no masses or organomegaly  Pelvic - VULVA: normal appearing vulva with no masses, tenderness or lesions  VAGINA: normal appearing vagina with normal color and discharge, no lesions  CERVIX: normal appearing cervix without discharge or lesions, no CMT  Thin prep pap is done w/ HR HPV cotesting  UTERUS: uterus is felt to be normal size, shape, consistency and nontender   ADNEXA: No adnexal masses or tenderness noted.  Extremities:  No swelling or varicosities noted  Chaperone: Levy Pupa    No results found for this or any previous visit (from the past  24 hour(s)).  Assessment & Plan:  1) Well-Woman Exam  2) Contraception management>depo given today, condoms x 2wks  3) Irregular periods w/ recent wet prep +for clues> discussed treating may help w/ the irregular periods, wants to try, so Rx metronidazole 500mg  BID x 7d for BV, no sex or etoh while taking   Labs/procedures today: pap, depo  Mammogram @27yo  or sooner if problems Colonoscopy @29yo  or sooner if problems  No orders of the defined types were placed in this encounter.   Meds:  Meds ordered this encounter    Medications  . metroNIDAZOLE (FLAGYL) 500 MG tablet    Sig: Take 1 tablet (500 mg total) by mouth 2 (two) times daily.    Dispense:  14 tablet    Refill:  0    Order Specific Question:   Supervising Provider    Answer:   Elonda Husky, LUTHER H [[id number]]  . medroxyPROGESTERone Acetate (DEPO-PROVERA) 150 MG/ML SUSY    Sig: Inject 1 mL (150 mg total) into the muscle every 3 (three) months.    Dispense:  0.9 mL    Refill:  3    Order Specific Question:   Supervising Provider    Answer:   Florian Buff [[id number]]    Follow-up: Return for 11-13wks for next depo, then 58yr for physical.  Roma Schanz CNM, WHNP-BC 07/09/2019 4:12 PM

## 2019-07-14 LAB — CYTOLOGY - PAP
Comment: NEGATIVE
Diagnosis: NEGATIVE
High risk HPV: NEGATIVE

## 2019-07-27 ENCOUNTER — Other Ambulatory Visit: Payer: Self-pay

## 2019-07-27 ENCOUNTER — Ambulatory Visit (HOSPITAL_COMMUNITY)
Admission: EM | Admit: 2019-07-27 | Discharge: 2019-07-27 | Disposition: A | Discharge status: Home / Self Care | Payer: Medicaid Other | Attending: Family Medicine | Admitting: Family Medicine

## 2019-07-27 ENCOUNTER — Encounter (HOSPITAL_COMMUNITY): Payer: Self-pay

## 2019-07-27 DIAGNOSIS — B354 Tinea corporis: Secondary | ICD-10-CM

## 2019-07-27 DIAGNOSIS — R21 Rash and other nonspecific skin eruption: Secondary | ICD-10-CM

## 2019-07-27 DIAGNOSIS — B350 Tinea barbae and tinea capitis: Secondary | ICD-10-CM | POA: Diagnosis not present

## 2019-07-27 DIAGNOSIS — Z3202 Encounter for pregnancy test, result negative: Secondary | ICD-10-CM

## 2019-07-27 LAB — POC URINE PREG, ED: Preg Test, Ur: NEGATIVE

## 2019-07-27 MED ORDER — CETIRIZINE HCL 10 MG PO TABS
10.0000 mg | ORAL_TABLET | Freq: Every day | ORAL | 0 refills | Status: DC
Start: 2019-07-27 — End: 2022-08-02

## 2019-07-27 MED ORDER — HYDROXYZINE HCL 25 MG PO TABS
25.0000 mg | ORAL_TABLET | Freq: Four times a day (QID) | ORAL | 0 refills | Status: DC
Start: 2019-07-27 — End: 2022-08-02

## 2019-07-27 MED ORDER — MUPIROCIN CALCIUM 2 % EX CREA
1.0000 | TOPICAL_CREAM | Freq: Two times a day (BID) | CUTANEOUS | 0 refills | Status: DC
Start: 2019-07-27 — End: 2022-08-02

## 2019-07-27 MED ORDER — TERBINAFINE HCL 250 MG PO TABS: 250.0000 mg | ORAL_TABLET | Freq: Every day | ORAL | 0 refills | Status: AC

## 2019-07-27 NOTE — ED Triage Notes (Signed)
Patient in today for a rash on her head, arms, and neck x 4 days. Patient states her children were recently treated for ringworm.

## 2019-07-27 NOTE — Discharge Instructions (Addendum)
Take the medications as prescribed Hydroxyzine as needed for itch, this may make you sleepy, do not drive after taking  Take terbanifine with food - do not drink alcohol while taking  Apply bactroban areas around nose 2 times daily  Call your primary care to have follow up in 7-10 days to discuss continuation of medications
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
## 2019-08-30 ENCOUNTER — Encounter (HOSPITAL_COMMUNITY): Payer: Self-pay

## 2019-08-30 ENCOUNTER — Other Ambulatory Visit: Payer: Self-pay

## 2019-08-30 ENCOUNTER — Emergency Department (HOSPITAL_COMMUNITY)
Admission: EM | Admit: 2019-08-30 | Discharge: 2019-08-30 | Disposition: A | Discharge status: Home / Self Care | Payer: Medicaid Other | Attending: Emergency Medicine | Admitting: Emergency Medicine

## 2019-08-30 DIAGNOSIS — Z20822 Contact with and (suspected) exposure to covid-19: Secondary | ICD-10-CM | POA: Diagnosis not present

## 2019-08-30 DIAGNOSIS — Z5321 Procedure and treatment not carried out due to patient leaving prior to being seen by health care provider: Secondary | ICD-10-CM | POA: Insufficient documentation

## 2019-08-30 DIAGNOSIS — J029 Acute pharyngitis, unspecified: Secondary | ICD-10-CM | POA: Diagnosis present

## 2019-08-30 DIAGNOSIS — M549 Dorsalgia, unspecified: Secondary | ICD-10-CM | POA: Diagnosis not present

## 2019-08-30 LAB — SARS CORONAVIRUS 2 BY RT PCR (HOSPITAL ORDER, PERFORMED IN ~~LOC~~ HOSPITAL LAB): SARS Coronavirus 2: NEGATIVE

## 2019-08-30 LAB — GROUP A STREP BY PCR: Group A Strep by PCR: DETECTED — AB

## 2019-08-30 MED ORDER — ACETAMINOPHEN 325 MG PO TABS
650.0000 mg | ORAL_TABLET | Freq: Once | ORAL | Status: AC | PRN
  Administered 2019-08-30: 650 mg via ORAL
  Filled 2019-08-30: qty 2

## 2019-08-30 NOTE — ED Triage Notes (Signed)
Patient complains of sore throat, back pain x 4 days. Denies fever. Patient alert and oriented

## 2019-08-30 NOTE — ED Notes (Signed)
No answer for ready room 

## 2019-10-01 ENCOUNTER — Ambulatory Visit: Payer: Medicaid Other

## 2019-12-31 ENCOUNTER — Ambulatory Visit (HOSPITAL_COMMUNITY): Payer: Medicaid Other | Admitting: Behavioral Health

## 2020-03-10 ENCOUNTER — Inpatient Hospital Stay: Admission: RE | Admit: 2020-03-10 | Payer: Medicaid Other | Source: Ambulatory Visit

## 2020-03-10 ENCOUNTER — Other Ambulatory Visit: Payer: Self-pay | Admitting: Oral Surgery

## 2020-03-10 DIAGNOSIS — D489 Neoplasm of uncertain behavior, unspecified: Secondary | ICD-10-CM

## 2020-09-02 ENCOUNTER — Ambulatory Visit (HOSPITAL_COMMUNITY)
Admission: EM | Admit: 2020-09-02 | Discharge: 2020-09-02 | Disposition: A | Discharge status: Home / Self Care | Payer: Medicaid Other | Attending: Psychiatry | Admitting: Psychiatry

## 2020-09-02 ENCOUNTER — Other Ambulatory Visit: Payer: Self-pay

## 2020-09-02 DIAGNOSIS — Z008 Encounter for other general examination: Secondary | ICD-10-CM

## 2020-09-02 DIAGNOSIS — Z1330 Encounter for screening examination for mental health and behavioral disorders, unspecified: Secondary | ICD-10-CM | POA: Insufficient documentation

## 2020-09-02 NOTE — Discharge Instructions (Addendum)
Be sure to follow up with resources and follow ups provided for psychiatry   In the event of worsening symptoms call the crisis hotline, 911, and or go to the nearest emergency department for appropriate evaluation and treatment of symptoms.  Follow-up with your primary care provider for your medical issues, concerns and or health care needs.

## 2020-09-02 NOTE — Progress Notes (Signed)
AVS reviewed with patient and all questions answered.  Patient verbalized understanding of information presented.  All belongings returned to patient from locker.  Patient ambulated to lobby independently without issue.  Discharged in stable condition; no acute distress noted.
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
## 2020-11-28 ENCOUNTER — Other Ambulatory Visit: Payer: Self-pay

## 2020-11-28 ENCOUNTER — Encounter (HOSPITAL_COMMUNITY): Payer: Self-pay

## 2020-11-28 ENCOUNTER — Ambulatory Visit (HOSPITAL_COMMUNITY)
Admission: EM | Admit: 2020-11-28 | Discharge: 2020-11-28 | Disposition: A | Discharge status: Home / Self Care | Payer: Medicaid Other | Attending: Sports Medicine | Admitting: Sports Medicine

## 2020-11-28 DIAGNOSIS — Z113 Encounter for screening for infections with a predominantly sexual mode of transmission: Secondary | ICD-10-CM | POA: Diagnosis not present

## 2020-11-28 DIAGNOSIS — H60502 Unspecified acute noninfective otitis externa, left ear: Secondary | ICD-10-CM | POA: Diagnosis not present

## 2020-11-28 DIAGNOSIS — Z8619 Personal history of other infectious and parasitic diseases: Secondary | ICD-10-CM | POA: Insufficient documentation

## 2020-11-28 DIAGNOSIS — R052 Subacute cough: Secondary | ICD-10-CM | POA: Insufficient documentation

## 2020-11-28 LAB — HIV ANTIBODY (ROUTINE TESTING W REFLEX): HIV Screen 4th Generation wRfx: NONREACTIVE

## 2020-11-28 MED ORDER — AZITHROMYCIN 250 MG PO TABS: 250.0000 mg | ORAL_TABLET | Freq: Every day | ORAL | 0 refills | Status: DC
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
## 2020-11-28 NOTE — ED Triage Notes (Signed)
Pt reports L ear pain x 6 days. Pt reports her R ear irrigated at a different urgent care, states it felt better. Pt states her L ear feel solid.   Pt reports having a new sexual partner and wants STD testing. States she has a issue with her head.

## 2020-11-29 ENCOUNTER — Telehealth (HOSPITAL_COMMUNITY): Payer: Self-pay | Admitting: Emergency Medicine

## 2020-11-29 LAB — CERVICOVAGINAL ANCILLARY ONLY
Bacterial Vaginitis (gardnerella): POSITIVE — AB
Candida Glabrata: NEGATIVE
Candida Vaginitis: NEGATIVE
Chlamydia: NEGATIVE
Comment: NEGATIVE
Comment: NEGATIVE
Comment: NEGATIVE
Comment: NEGATIVE
Comment: NEGATIVE
Comment: NORMAL
Neisseria Gonorrhea: NEGATIVE
Trichomonas: NEGATIVE

## 2020-11-29 LAB — RPR: RPR Ser Ql: NONREACTIVE

## 2020-11-29 MED ORDER — METRONIDAZOLE 500 MG PO TABS: 500.0000 mg | ORAL_TABLET | Freq: Two times a day (BID) | ORAL | 0 refills | Status: DC

## 2021-02-12 ENCOUNTER — Encounter (HOSPITAL_COMMUNITY): Payer: Self-pay | Admitting: Emergency Medicine

## 2021-02-12 ENCOUNTER — Ambulatory Visit (HOSPITAL_COMMUNITY)
Admission: EM | Admit: 2021-02-12 | Discharge: 2021-02-12 | Disposition: A | Discharge status: Home / Self Care | Payer: Medicaid Other | Attending: Physician Assistant | Admitting: Physician Assistant

## 2021-02-12 ENCOUNTER — Other Ambulatory Visit: Payer: Self-pay

## 2021-02-12 DIAGNOSIS — R350 Frequency of micturition: Secondary | ICD-10-CM | POA: Diagnosis not present

## 2021-02-12 DIAGNOSIS — Z113 Encounter for screening for infections with a predominantly sexual mode of transmission: Secondary | ICD-10-CM | POA: Insufficient documentation

## 2021-02-12 DIAGNOSIS — Z202 Contact with and (suspected) exposure to infections with a predominantly sexual mode of transmission: Secondary | ICD-10-CM | POA: Insufficient documentation

## 2021-02-12 LAB — POCT URINALYSIS DIPSTICK, ED / UC
Glucose, UA: NEGATIVE mg/dL
Hgb urine dipstick: NEGATIVE
Ketones, ur: 40 mg/dL — AB
Leukocytes,Ua: NEGATIVE
Nitrite: NEGATIVE
Protein, ur: NEGATIVE mg/dL
Specific Gravity, Urine: 1.025 (ref 1.005–1.030)
Urobilinogen, UA: 4 mg/dL — ABNORMAL HIGH (ref 0.0–1.0)
pH: 5.5 (ref 5.0–8.0)

## 2021-02-12 LAB — POC URINE PREG, ED: Preg Test, Ur: NEGATIVE

## 2021-02-12 NOTE — Discharge Instructions (Addendum)
Urine showed no evidence of infection.  Urine pregnancy test was negative.  We will contact you if any of your testing is positive and we need to arrange treatment.  Please abstain from sex until you receive your results.  It is important you use a condom with each sexual encounter.  If you are positive for an STI all partners will need to be tested and treated as well.  If you develop any symptoms including abdominal pain, fever, nausea/vomiting, pelvic pain you should be seen immediately.

## 2021-02-12 NOTE — ED Triage Notes (Signed)
Patient was notified she had been exposed to gonorrhea, chlamydia, trich and vaginosis.    Patient denies itch, denies discharge or odor
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
## 2021-02-13 LAB — HIV ANTIBODY (ROUTINE TESTING W REFLEX): HIV Screen 4th Generation wRfx: NONREACTIVE

## 2021-02-13 LAB — CERVICOVAGINAL ANCILLARY ONLY
Bacterial Vaginitis (gardnerella): POSITIVE — AB
Candida Glabrata: NEGATIVE
Candida Vaginitis: POSITIVE — AB
Chlamydia: NEGATIVE
Comment: NEGATIVE
Comment: NEGATIVE
Comment: NEGATIVE
Comment: NEGATIVE
Comment: NEGATIVE
Comment: NORMAL
Neisseria Gonorrhea: NEGATIVE
Trichomonas: NEGATIVE

## 2021-02-13 LAB — HEPATITIS C ANTIBODY: HCV Ab: NONREACTIVE

## 2021-02-14 ENCOUNTER — Telehealth (HOSPITAL_COMMUNITY): Payer: Self-pay | Admitting: Emergency Medicine

## 2021-02-14 LAB — RPR: RPR Ser Ql: NONREACTIVE

## 2021-02-14 MED ORDER — FLUCONAZOLE 150 MG PO TABS: 150.0000 mg | ORAL_TABLET | Freq: Once | ORAL | 0 refills | Status: AC

## 2021-02-14 MED ORDER — METRONIDAZOLE 500 MG PO TABS: 500.0000 mg | ORAL_TABLET | Freq: Two times a day (BID) | ORAL | 0 refills | Status: DC

## 2021-05-23 ENCOUNTER — Other Ambulatory Visit: Payer: Self-pay

## 2021-05-23 ENCOUNTER — Emergency Department (HOSPITAL_COMMUNITY): Payer: Medicaid Other

## 2021-05-23 ENCOUNTER — Emergency Department (HOSPITAL_COMMUNITY)
Admission: EM | Admit: 2021-05-23 | Discharge: 2021-05-23 | Discharge status: Against Medical Advice | Payer: Medicaid Other | Attending: Emergency Medicine | Admitting: Emergency Medicine

## 2021-05-23 ENCOUNTER — Encounter (HOSPITAL_COMMUNITY): Payer: Self-pay | Admitting: Emergency Medicine

## 2021-05-23 DIAGNOSIS — M79602 Pain in left arm: Secondary | ICD-10-CM | POA: Diagnosis not present

## 2021-05-23 DIAGNOSIS — R202 Paresthesia of skin: Secondary | ICD-10-CM | POA: Insufficient documentation

## 2021-05-23 DIAGNOSIS — R0789 Other chest pain: Secondary | ICD-10-CM | POA: Diagnosis present

## 2021-05-23 DIAGNOSIS — Z5321 Procedure and treatment not carried out due to patient leaving prior to being seen by health care provider: Secondary | ICD-10-CM | POA: Diagnosis not present

## 2021-05-23 LAB — CBC
HCT: 35.9 % — ABNORMAL LOW (ref 36.0–46.0)
Hemoglobin: 11.9 g/dL — ABNORMAL LOW (ref 12.0–15.0)
MCH: 28.3 pg (ref 26.0–34.0)
MCHC: 33.1 g/dL (ref 30.0–36.0)
MCV: 85.3 fL (ref 80.0–100.0)
Platelets: 220 10*3/uL (ref 150–400)
RBC: 4.21 MIL/uL (ref 3.87–5.11)
RDW: 15.1 % (ref 11.5–15.5)
WBC: 6.4 10*3/uL (ref 4.0–10.5)
nRBC: 0 % (ref 0.0–0.2)

## 2021-05-23 LAB — BASIC METABOLIC PANEL
Anion gap: 6 (ref 5–15)
BUN: 7 mg/dL (ref 6–20)
CO2: 27 mmol/L (ref 22–32)
Calcium: 8.9 mg/dL (ref 8.9–10.3)
Chloride: 106 mmol/L (ref 98–111)
Creatinine, Ser: 0.76 mg/dL (ref 0.44–1.00)
GFR, Estimated: >60 mL/min (ref >60)
Glucose, Bld: 84 mg/dL (ref 70–99)
Potassium: 3.7 mmol/L (ref 3.5–5.1)
Sodium: 139 mmol/L (ref 135–145)

## 2021-05-23 LAB — I-STAT BETA HCG BLOOD, ED (MC, WL, AP ONLY): I-stat hCG, quantitative: <5 m[IU]/mL (ref <5)

## 2021-05-23 LAB — TROPONIN I (HIGH SENSITIVITY): Troponin I (High Sensitivity): <2 ng/L (ref <18)

## 2021-05-23 NOTE — ED Provider Triage Note (Signed)
Emergency Medicine Provider Triage Evaluation Note  Lori Johnston , a 31 y.o. female  was evaluated in triage.  Pt complains of chest and arm discomfort.  Reports at 3:30 in the morning she got up and started to feel sharp pain in her chest through her shoulder and into her armpit.  She laid down and took off her gloves because she all of a sudden got sweaty.  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  Denies shortness of breath, palpitations.  Is on Depo-Provera shot, no history of blood clot, recent surgery, travel, leg swelling.  Review of Systems  Positive: As above Negative: As above  Physical Exam  BP 119/67   Pulse 80   Temp 98.6 F (37 C) (Oral)   Resp 18   SpO2 100%  Gen:   Awake, no distress   Resp:  Normal effort  MSK:   Moves extremities without difficulty  Other:  Reproducible pain in the left upper chest and axilla.  Medical Decision Making  Medically screening exam initiated at 11:15 AM.  Appropriate orders placed.  Lori Johnston was informed that the remainder of the evaluation will be completed by another provider, this initial triage assessment does not replace that evaluation, and the importance of remaining in the ED until their evaluation is complete.     Lori Hammock, PA-C 05/23/21 1116

## 2021-05-23 NOTE — ED Triage Notes (Signed)
Pt. Stated, I started having some chest pain with left arm pain around 1030. I also have some tingling on my face.

## 2022-08-02 ENCOUNTER — Ambulatory Visit (HOSPITAL_COMMUNITY)
Admission: EM | Admit: 2022-08-02 | Discharge: 2022-08-02 | Disposition: A | Discharge status: Home / Self Care | Payer: Commercial Managed Care - HMO | Attending: Family Medicine | Admitting: Family Medicine

## 2022-08-02 ENCOUNTER — Encounter (HOSPITAL_COMMUNITY): Payer: Self-pay | Admitting: *Deleted

## 2022-08-02 ENCOUNTER — Other Ambulatory Visit (HOSPITAL_COMMUNITY)
Admission: RE | Admit: 2022-08-02 | Discharge: 2022-08-02 | Disposition: A | Discharge status: Home / Self Care | Payer: Commercial Managed Care - HMO | Source: Ambulatory Visit | Attending: Family Medicine | Admitting: Family Medicine

## 2022-08-02 DIAGNOSIS — Z113 Encounter for screening for infections with a predominantly sexual mode of transmission: Secondary | ICD-10-CM | POA: Insufficient documentation

## 2022-08-02 DIAGNOSIS — L739 Follicular disorder, unspecified: Secondary | ICD-10-CM | POA: Insufficient documentation

## 2022-08-02 DIAGNOSIS — N898 Other specified noninflammatory disorders of vagina: Secondary | ICD-10-CM | POA: Insufficient documentation

## 2022-08-02 DIAGNOSIS — R102 Pelvic and perineal pain: Secondary | ICD-10-CM | POA: Diagnosis not present

## 2022-08-02 MED ORDER — DOXYCYCLINE HYCLATE 100 MG PO CAPS: 100.0000 mg | ORAL_CAPSULE | Freq: Two times a day (BID) | ORAL | 0 refills | Status: DC

## 2022-08-02 NOTE — ED Triage Notes (Signed)
Pt states that she had sex last night and as the penis went in her vagina it hurt and that's never happened before. She states since she has vaginal pain

## 2022-08-02 NOTE — Discharge Instructions (Signed)
We have sent testing for sexually transmitted infections. We will notify you of any positive results once they are received. If required, we will prescribe any medications you might need.  Please refrain from all sexual activity for at least the next seven days.

## 2022-08-03 ENCOUNTER — Telehealth (HOSPITAL_COMMUNITY): Payer: Self-pay | Admitting: Emergency Medicine

## 2022-08-03 NOTE — Telephone Encounter (Signed)
Lab contacted staff and requested different order for cytology swab.  Completed.

## 2022-08-06 ENCOUNTER — Telehealth: Payer: Self-pay

## 2022-08-06 MED ORDER — METRONIDAZOLE 0.75 % VA GEL: 1.0000 | Freq: Every day | VAGINAL | Status: AC

## 2022-08-06 NOTE — Telephone Encounter (Signed)
Per protocol, pt requires tx with metronidazole. Reviewed with patient, verified pharmacy, prescription sent.

## 2022-08-27 ENCOUNTER — Ambulatory Visit: Payer: Commercial Managed Care - HMO | Admitting: Physician Assistant

## 2022-08-29 NOTE — ED Provider Notes (Signed)
[facility]   [id number] 08/02/22 Arrival Time: 1953  ASSESSMENT & PLAN:  1. Vaginal pain   2. Acute folliculitis   3. Screening for STDs (sexually transmitted diseases)    Start below for suspected folliculitis: Meds ordered this encounter  Medications   doxycycline (VIBRAMYCIN) 100 MG capsule    Sig: Take 1 capsule (100 mg total) by mouth 2 (two) times daily.    Dispense:  14 capsule    Refill:  0      Discharge Instructions      We have sent testing for sexually transmitted infections. We will notify you of any positive results once they are received. If required, we will prescribe any medications you might need.  Please refrain from all sexual activity for at least the next seven days.     Without s/s of PID.  Labs Reviewed  CYTOLOGY, (ORAL, ANAL, URETHRAL) ANCILLARY ONLY         Will notify of any positive results. Instructed to refrain from sexual activity for at least seven days.  Reviewed expectations re: course of current medical issues. Questions answered. Outlined signs and symptoms indicating need for more acute intervention. Patient verbalized understanding. After Visit Summary given.   SUBJECTIVE:  Lori Johnston is a 32 y.o. female who states that she had sex last night and as the penis went in her vagina it hurt and that's never happened before. She states since she has vaginal pain  No LMP recorded. Patient has had an injection.   OBJECTIVE:  Vitals:   08/02/22 2047  BP: 115/74  Pulse: 79  Resp: 18  Temp: 98.9 F (37.2 C)  TempSrc: Oral  SpO2: 98%    General appearance: alert, cooperative, appears stated age and no distress Lungs: unlabored respirations; speaks full sentences without difficulty Back: no CVA tenderness; FROM at waist Abdomen: soft, non-tender GU: deferred Skin: warm and dry Psychological: alert and cooperative; normal mood and affect.    Labs Reviewed  CYTOLOGY, (ORAL, ANAL, URETHRAL) ANCILLARY  ONLY    Allergies  Allergen Reactions   Bee Venom Anaphylaxis   Other Anaphylaxis, Swelling and Rash    Tomatoes.    Past Medical History:  Diagnosis Date   Abnormal uterine bleeding (AUB) 05/28/2014   Bronchitis    BV (bacterial vaginosis) 12/10/2012   Contraceptive education 07/16/2013   Contraceptive management 04/21/2015   Decreased appetite 05/28/2014   Dyspareunia 12/10/2012   Gonorrhea affecting pregnancy in first trimester 05/04/2016   Treated in ER 5/1   Hematuria 07/06/2015   History of chlamydia    History of PID 07/16/2013   Irregular intermenstrual bleeding 07/06/2015   Irregular periods 01/18/2015   Migraine    Nausea 01/18/2015   Nexplanon in place 07/06/2014   Nexplanon insertion 08/27/2013   Pain with urination 07/06/2015   PID (pelvic inflammatory disease)    RUQ pain 07/16/2013   Strain of rectus abdominis muscle 06/10/2014   Umbilical hernia 06/10/2014   UTI (lower urinary tract infection)    Vaginal discharge 12/10/2012   Weight loss 07/06/2014   Wheezing on expiration 12/09/2014   Yeast infection 07/16/2013   Family History  Problem Relation Age of Onset   Diabetes Cousin    Ulcers Cousin    Deafness Paternal Grandmother    Migraines Paternal Grandmother    Migraines Mother    Migraines Sister    Migraines Sister    Heart attack Other        maternal great great grandma  Mental illness Maternal Aunt    Other Daughter        omphalecle   Anesthesia problems Neg Hx    Hypotension Neg Hx    Malignant hyperthermia Neg Hx    Pseudochol deficiency Neg Hx    Colon cancer Neg Hx    Social History   Socioeconomic History   Marital status: Single    Spouse name: Not on file   Number of children: Not on file   Years of education: Not on file   Highest education level: Not on file  Occupational History   Not on file  Tobacco Use   Smoking status: Former    Current packs/day: 0.00    Average packs/day: 0.2 packs/day for 2.0 years (0.4 ttl pk-yrs)    Types:  Cigarettes    Start date: 01/01/2014    Quit date: 01/02/2016    Years since quitting: 6.6   Smokeless tobacco: Never   Tobacco comments:    Smokes black and milds last use 03/11/2018  Vaping Use   Vaping status: Some Days  Substance and Sexual Activity   Alcohol use: Not Currently    Alcohol/week: 1.0 standard drink of alcohol    Types: 1 Glasses of wine per week    Comment: rarely (holidays and family events)   Drug use: Not Currently    Types: Marijuana    Comment: Last use 02/09/2018   Sexual activity: Yes    Birth control/protection: Injection    Comment: hx chlamydia during 1st preg; was treated  Other Topics Concern   Not on file  Social History Narrative   Not on file   Social Determinants of Health   Financial Resource Strain: Low Risk  (06/12/2019)   Overall Financial Resource Strain (CARDIA)    Difficulty of Paying Living Expenses: Not very hard  Food Insecurity: Food Insecurity Present (06/12/2019)   Hunger Vital Sign    Worried About Running Out of Food in the Last Year: Sometimes true    Ran Out of Food in the Last Year: Never true  Transportation Needs: No Transportation Needs (06/12/2019)   PRAPARE - Administrator, Civil Service (Medical): No    Lack of Transportation (Non-Medical): No  Physical Activity: Inactive (06/12/2019)   Exercise Vital Sign    Days of Exercise per Week: 0 days    Minutes of Exercise per Session: 0 min  Stress: No Stress Concern Present (06/12/2019)   [employer] of Occupational Health - Occupational Stress Questionnaire    Feeling of Stress : Not at all  Social Connections: Socially Isolated (06/12/2019)   Social Connection and Isolation Panel [NHANES]    Frequency of Communication with Friends and Family: Twice a week    Frequency of Social Gatherings with Friends and Family: Never    Attends Religious Services: Never    Database administrator or Organizations: No    Attends Banker Meetings: Never     Marital Status: Never married  Intimate Partner Violence: Not At Risk (06/12/2019)   Humiliation, Afraid, Rape, and Kick questionnaire    Fear of Current or Ex-Partner: No    Emotionally Abused: No    Physically Abused: No    Sexually Abused: No           Mardella Layman, MD 08/29/22 1343

## 2022-11-27 IMAGING — CR DG CHEST 2V
2 series · 2 of 2 positions shown · non-contrast
Comparison: January 17, 2016

CLINICAL DATA: cp

EXAM:
CHEST - 2 VIEW

[chest pa]
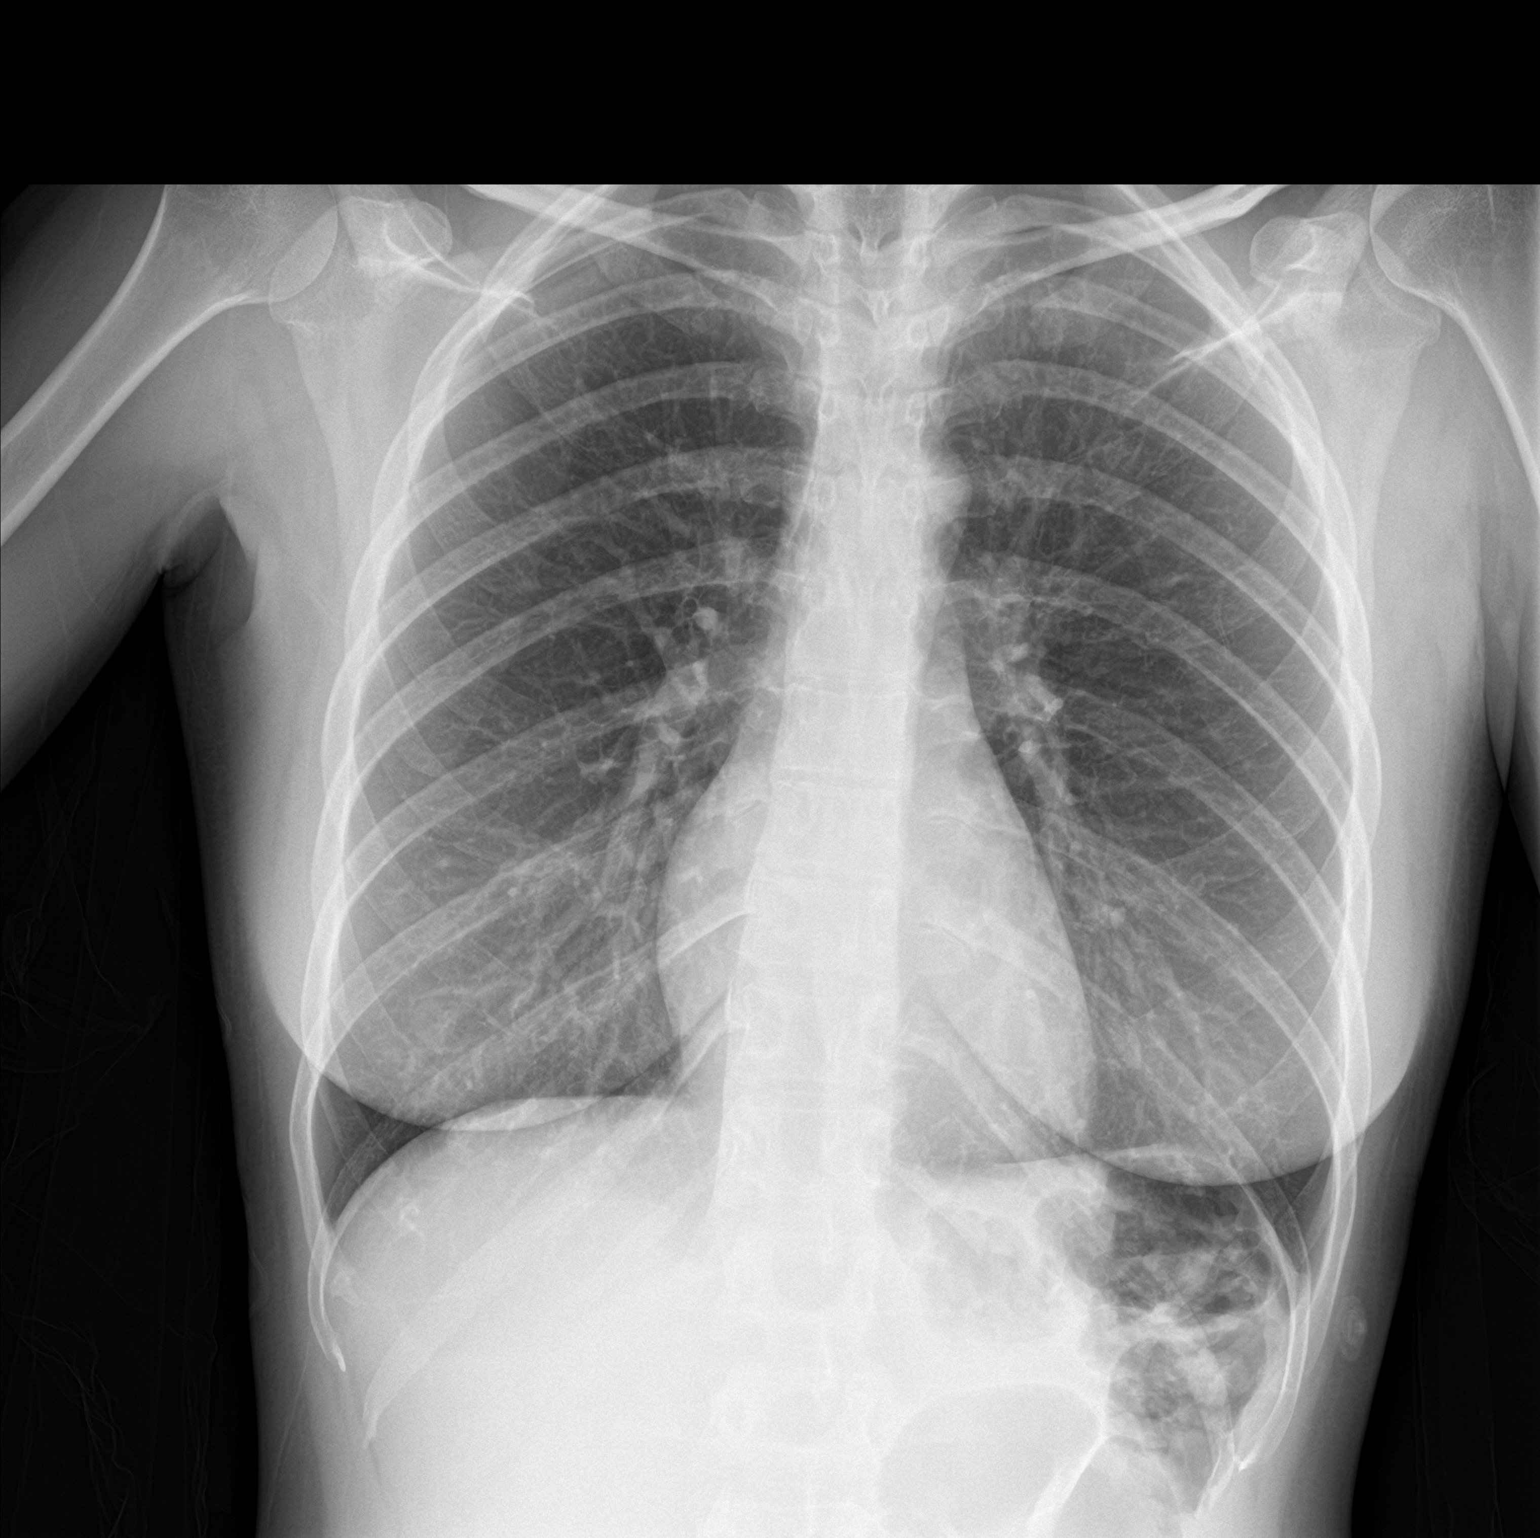

[chest lat]
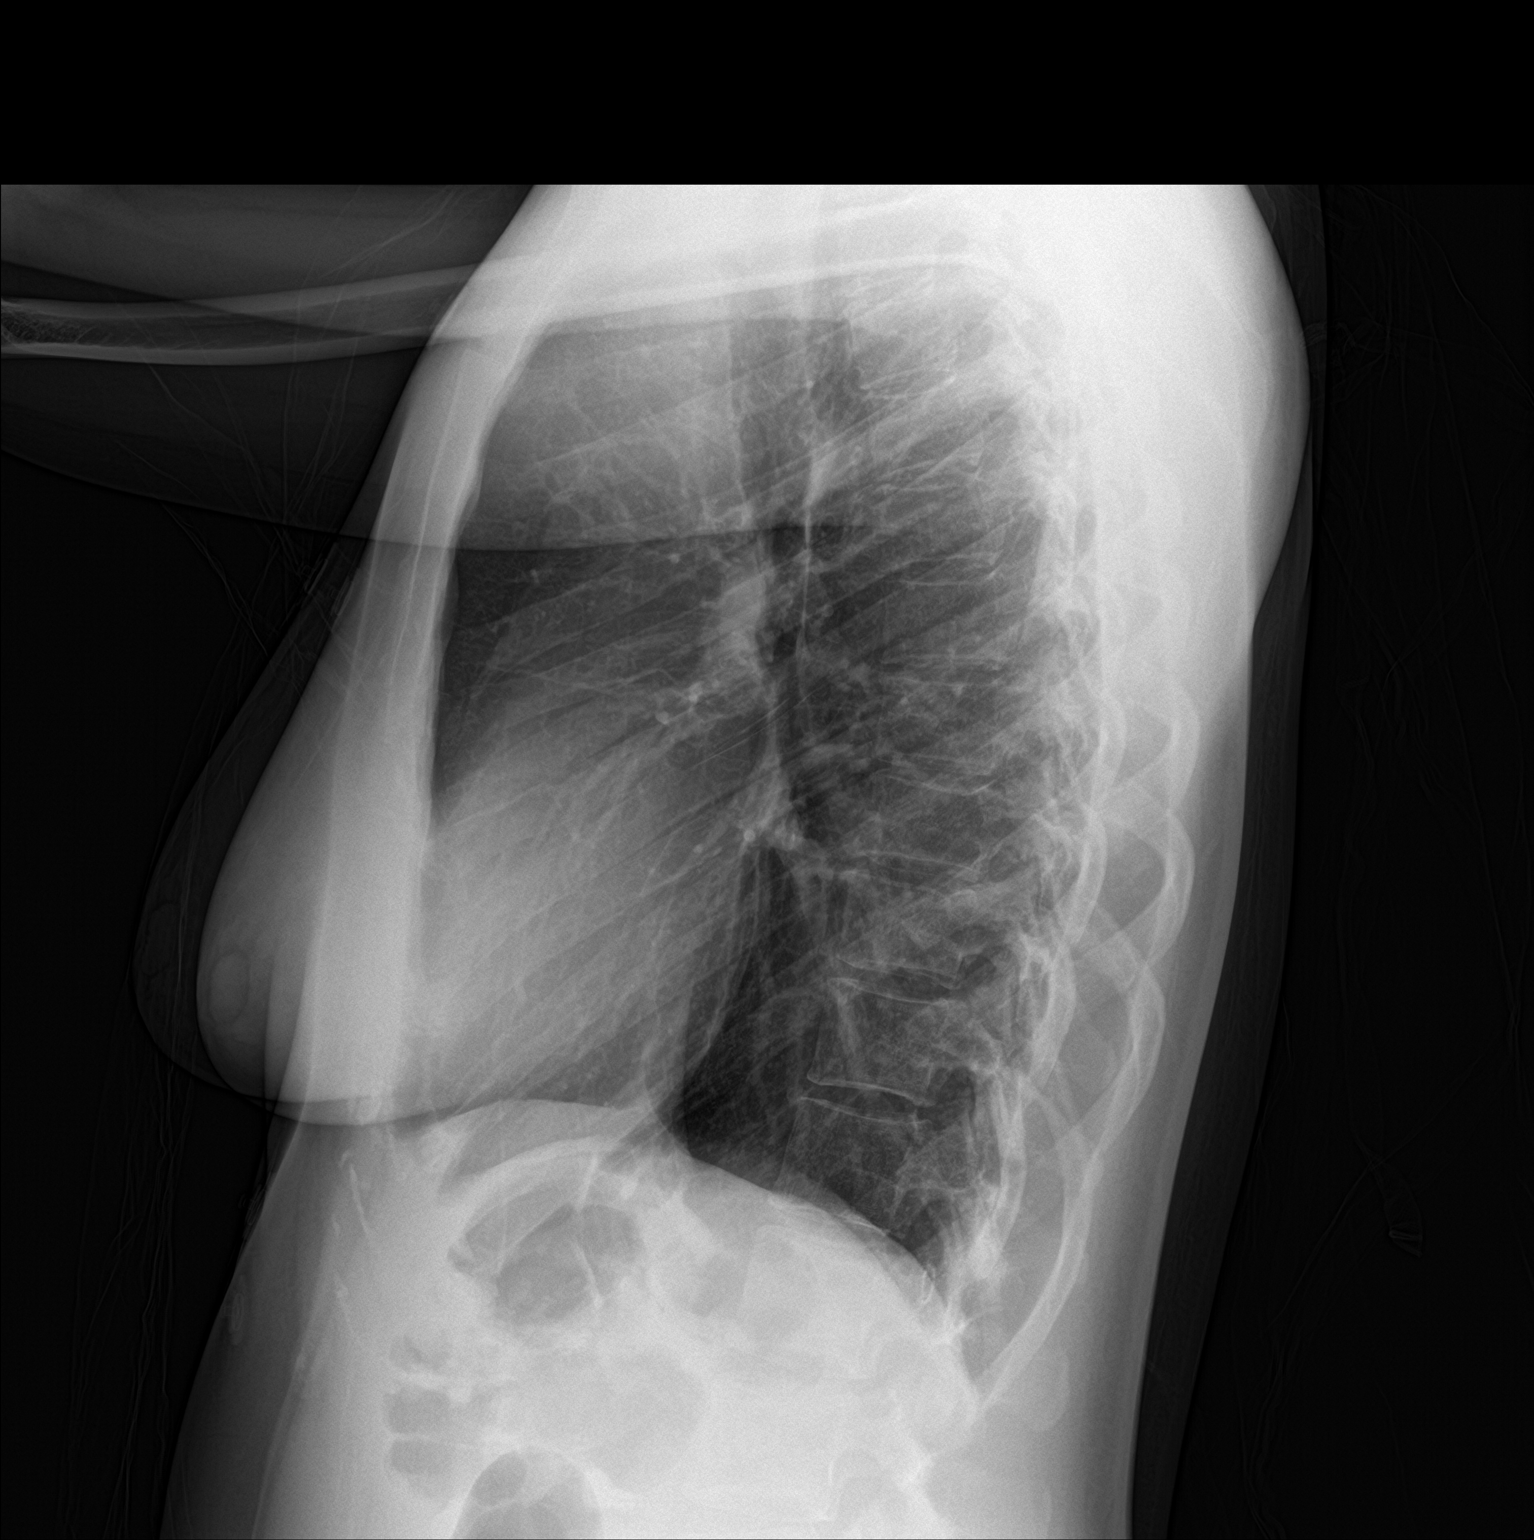

[2 of 2 positions shown; findings below may reference images not displayed]

FINDINGS: The heart size and mediastinal contours are within normal limits.
Both lungs are clear. The visualized skeletal structures are
unremarkable.
IMPRESSION: No active cardiopulmonary disease.

## 2023-03-25 ENCOUNTER — Encounter (HOSPITAL_COMMUNITY): Payer: Self-pay

## 2023-03-25 ENCOUNTER — Ambulatory Visit (HOSPITAL_COMMUNITY)
Admission: EM | Admit: 2023-03-25 | Discharge: 2023-03-25 | Disposition: A | Discharge status: Home / Self Care | Attending: Family Medicine | Admitting: Family Medicine

## 2023-03-25 DIAGNOSIS — R102 Pelvic and perineal pain: Secondary | ICD-10-CM | POA: Insufficient documentation

## 2023-03-25 DIAGNOSIS — R399 Unspecified symptoms and signs involving the genitourinary system: Secondary | ICD-10-CM | POA: Insufficient documentation

## 2023-03-25 LAB — POCT URINALYSIS DIP (MANUAL ENTRY)
Bilirubin, UA: NEGATIVE
Blood, UA: NEGATIVE
Glucose, UA: NEGATIVE mg/dL
Nitrite, UA: NEGATIVE
Spec Grav, UA: 1.02 (ref 1.010–1.025)
Urobilinogen, UA: 2 U/dL — AB
pH, UA: 7.5 (ref 5.0–8.0)

## 2023-03-25 LAB — HIV ANTIBODY (ROUTINE TESTING W REFLEX): HIV Screen 4th Generation wRfx: NONREACTIVE

## 2023-03-25 LAB — POCT URINE PREGNANCY: Preg Test, Ur: NEGATIVE

## 2023-03-25 MED ORDER — CEPHALEXIN 500 MG PO CAPS: 500.0000 mg | ORAL_CAPSULE | Freq: Two times a day (BID) | ORAL | 0 refills | Status: AC

## 2023-03-25 NOTE — Discharge Instructions (Addendum)
 It was nice seeing you. Your urine is positive for leukocytes, which could suggest a urine infection. Given your symptoms, I sent an antibiotic to your pharmacy. You also have bile and ketone in your urine, which could be due to dehydration. Keep hydrated for this and recheck your urine in about 1-2 weeks. Urine pregnancy test is negative  I will contact you soon with your STD screen.

## 2023-03-25 NOTE — ED Triage Notes (Signed)
 Pt c/o bladder pressure with urgency and frequency x3 days. Pt requesting STD testing, denies sx's.
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
## 2023-03-26 ENCOUNTER — Encounter: Payer: Self-pay | Admitting: Family Medicine

## 2023-03-26 ENCOUNTER — Other Ambulatory Visit: Payer: Self-pay | Admitting: Family Medicine

## 2023-03-26 LAB — CERVICOVAGINAL ANCILLARY ONLY
Bacterial Vaginitis (gardnerella): POSITIVE — AB
Candida Glabrata: NEGATIVE
Candida Vaginitis: NEGATIVE
Chlamydia: NEGATIVE
Comment: NEGATIVE
Comment: NEGATIVE
Comment: NEGATIVE
Comment: NEGATIVE
Comment: NEGATIVE
Comment: NORMAL
Neisseria Gonorrhea: NEGATIVE
Trichomonas: NEGATIVE

## 2023-03-26 LAB — URINE CULTURE: Culture: <10000 — AB

## 2023-03-26 LAB — RPR: RPR Ser Ql: NONREACTIVE

## 2023-03-26 MED ORDER — METRONIDAZOLE 500 MG PO TABS: 500.0000 mg | ORAL_TABLET | Freq: Two times a day (BID) | ORAL | 0 refills | Status: AC

## 2023-04-05 ENCOUNTER — Ambulatory Visit (INDEPENDENT_AMBULATORY_CARE_PROVIDER_SITE_OTHER)

## 2023-04-05 ENCOUNTER — Ambulatory Visit (HOSPITAL_COMMUNITY): Admission: EM | Admit: 2023-04-05 | Discharge: 2023-04-05 | Disposition: A | Discharge status: Home / Self Care

## 2023-04-05 ENCOUNTER — Encounter (HOSPITAL_COMMUNITY): Payer: Self-pay | Admitting: *Deleted

## 2023-04-05 DIAGNOSIS — M25561 Pain in right knee: Secondary | ICD-10-CM | POA: Diagnosis not present

## 2023-04-05 DIAGNOSIS — M5441 Lumbago with sciatica, right side: Secondary | ICD-10-CM | POA: Diagnosis not present

## 2023-04-05 MED ORDER — METHOCARBAMOL 500 MG PO TABS: 500.0000 mg | ORAL_TABLET | Freq: Two times a day (BID) | ORAL | 0 refills | Status: DC

## 2023-04-05 MED ORDER — DEXAMETHASONE SODIUM PHOSPHATE 10 MG/ML IJ SOLN
INTRAMUSCULAR | Status: AC
  Filled 2023-04-05: qty 1

## 2023-04-05 MED ORDER — NAPROXEN 375 MG PO TABS: 375.0000 mg | ORAL_TABLET | Freq: Two times a day (BID) | ORAL | 0 refills | Status: DC

## 2023-04-05 MED ORDER — DEXAMETHASONE SODIUM PHOSPHATE 10 MG/ML IJ SOLN
10.0000 mg | Freq: Once | INTRAMUSCULAR | Status: AC
  Administered 2023-04-05: 10 mg via INTRAMUSCULAR
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
## 2023-05-30 ENCOUNTER — Encounter (HOSPITAL_COMMUNITY): Payer: Self-pay | Admitting: Emergency Medicine

## 2023-05-30 ENCOUNTER — Ambulatory Visit (HOSPITAL_COMMUNITY)
Admission: EM | Admit: 2023-05-30 | Discharge: 2023-05-30 | Disposition: A | Discharge status: Home / Self Care | Attending: Family Medicine | Admitting: Family Medicine

## 2023-05-30 DIAGNOSIS — N939 Abnormal uterine and vaginal bleeding, unspecified: Secondary | ICD-10-CM

## 2023-05-30 DIAGNOSIS — B070 Plantar wart: Secondary | ICD-10-CM | POA: Diagnosis not present

## 2023-05-30 DIAGNOSIS — B369 Superficial mycosis, unspecified: Secondary | ICD-10-CM | POA: Diagnosis not present

## 2023-05-30 LAB — CBC
HCT: 38.6 % (ref 36.0–46.0)
Hemoglobin: 12.5 g/dL (ref 12.0–15.0)
MCH: 28 pg (ref 26.0–34.0)
MCHC: 32.4 g/dL (ref 30.0–36.0)
MCV: 86.4 fL (ref 80.0–100.0)
Platelets: 233 10*3/uL (ref 150–400)
RBC: 4.47 MIL/uL (ref 3.87–5.11)
RDW: 15.9 % — ABNORMAL HIGH (ref 11.5–15.5)
WBC: 7.9 10*3/uL (ref 4.0–10.5)
nRBC: 0 % (ref 0.0–0.2)

## 2023-05-30 LAB — TSH: TSH: 1.138 u[IU]/mL (ref 0.350–4.500)

## 2023-05-30 LAB — POCT URINE PREGNANCY: Preg Test, Ur: NEGATIVE

## 2023-05-30 MED ORDER — MEDROXYPROGESTERONE ACETATE 10 MG PO TABS: 10.0000 mg | ORAL_TABLET | Freq: Every day | ORAL | 0 refills | Status: AC

## 2023-05-30 MED ORDER — KETOCONAZOLE 2 % EX CREA: 1.0000 | TOPICAL_CREAM | Freq: Every day | CUTANEOUS | 0 refills | Status: AC

## 2023-05-30 NOTE — ED Triage Notes (Signed)
 Pt gets Depo injections and normally dont have a menstrual cycle. Having menstrual since May 11. Reports saw PCP 2 weeks ago and had lab work done that was normal.

## 2023-05-30 NOTE — Discharge Instructions (Signed)
 The pregnancy test was negative  We have drawn blood to check your blood counts to see if your anemia has worsened and to check thyroid function numbers.  Our staff will notify you if anything significantly abnormal.  Staff will notify you if there is anything positive on the swab.  Take medroxyprogesterone  10 mg--1 tablet daily for 7 days.  Hopefully this medication will help you stop bleeding; then you should expect a withdrawal bleed 3 to 4 days after you finish this medication.  Ketoconazole cream--apply once daily to the rash area till better.  Please get salicylic acid patches for plantar warts from the pharmacy.  There are several brands and usually a store has its own brand.  Use it according to the package instructions.  Please follow up with your primary care about all these issues.

## 2023-05-30 NOTE — ED Triage Notes (Signed)
 Pt reports callus on right foot for several weeks. Went to a nail salon who told they do not do that and need to see a doctor. Reports pain.   Pt reports that around her mouth having outbreak of bumps for a week. Denies any new soaps, detergents, lotions, etc.

## 2023-05-31 ENCOUNTER — Ambulatory Visit: Payer: Self-pay

## 2023-05-31 LAB — CERVICOVAGINAL ANCILLARY ONLY
Chlamydia: NEGATIVE
Comment: NEGATIVE
Comment: NEGATIVE
Comment: NORMAL
Neisseria Gonorrhea: NEGATIVE
Trichomonas: NEGATIVE
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
## 2023-06-19 ENCOUNTER — Other Ambulatory Visit: Payer: Self-pay

## 2023-06-19 ENCOUNTER — Telehealth: Payer: Self-pay

## 2023-06-19 ENCOUNTER — Ambulatory Visit (INDEPENDENT_AMBULATORY_CARE_PROVIDER_SITE_OTHER): Admitting: Radiology

## 2023-06-19 ENCOUNTER — Ambulatory Visit
Admission: RE | Admit: 2023-06-19 | Discharge: 2023-06-19 | Disposition: A | Discharge status: Home / Self Care | Source: Ambulatory Visit | Attending: Physician Assistant | Admitting: Physician Assistant

## 2023-06-19 DIAGNOSIS — M25512 Pain in left shoulder: Secondary | ICD-10-CM

## 2023-06-19 DIAGNOSIS — M542 Cervicalgia: Secondary | ICD-10-CM | POA: Diagnosis not present

## 2023-06-19 MED ORDER — ACETAMINOPHEN 325 MG PO TABS
975.0000 mg | ORAL_TABLET | Freq: Once | ORAL | Status: AC
Start: 2023-06-19 — End: 2023-06-19
  Administered 2023-06-19: 975 mg via ORAL

## 2023-06-19 NOTE — Telephone Encounter (Signed)
 Patient called this RN at this time. Name and DOB verified. Pt requested work note be sent to her MyChart for today's visit. This RN sent over note via MyChart. Instructed patient on how to view. Pt verbalized understanding and confirmed she received work note. No further concerns at this time.
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
## 2023-06-20 ENCOUNTER — Ambulatory Visit (HOSPITAL_COMMUNITY): Payer: Self-pay

## 2023-07-03 ENCOUNTER — Ambulatory Visit (HOSPITAL_COMMUNITY)
Admission: EM | Admit: 2023-07-03 | Discharge: 2023-07-03 | Disposition: A | Discharge status: Home / Self Care | Attending: Physician Assistant | Admitting: Physician Assistant

## 2023-07-03 ENCOUNTER — Encounter (HOSPITAL_COMMUNITY): Payer: Self-pay

## 2023-07-03 DIAGNOSIS — N898 Other specified noninflammatory disorders of vagina: Secondary | ICD-10-CM | POA: Insufficient documentation

## 2023-07-03 DIAGNOSIS — Z113 Encounter for screening for infections with a predominantly sexual mode of transmission: Secondary | ICD-10-CM | POA: Insufficient documentation

## 2023-07-03 DIAGNOSIS — R21 Rash and other nonspecific skin eruption: Secondary | ICD-10-CM | POA: Insufficient documentation

## 2023-07-03 MED ORDER — TRIAMCINOLONE ACETONIDE 0.025 % EX OINT: 1.0000 | TOPICAL_OINTMENT | Freq: Two times a day (BID) | CUTANEOUS | 1 refills | Status: AC

## 2023-07-03 NOTE — ED Triage Notes (Signed)
 Patient here today with c/o vaginal discharge X 2 days.   Patient also c/o ongoing rash on face. Patient was here and was prescribed ketoconazole  with no relief. Patient states that she has had the same rash in the past and has used Triamcinolone which helped a lot.

## 2023-07-03 NOTE — Discharge Instructions (Signed)
You have test pending.  Return if any problems.  

## 2023-07-04 LAB — CERVICOVAGINAL ANCILLARY ONLY
Bacterial Vaginitis (gardnerella): POSITIVE — AB
Candida Glabrata: NEGATIVE
Candida Vaginitis: NEGATIVE
Chlamydia: NEGATIVE
Comment: NEGATIVE
Comment: NEGATIVE
Comment: NEGATIVE
Comment: NEGATIVE
Comment: NEGATIVE
Comment: NORMAL
Neisseria Gonorrhea: NEGATIVE
Trichomonas: NEGATIVE
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
## 2023-07-05 ENCOUNTER — Ambulatory Visit (HOSPITAL_COMMUNITY): Payer: Self-pay

## 2023-07-05 MED ORDER — METRONIDAZOLE 0.75 % VA GEL: 1.0000 | Freq: Every day | VAGINAL | 0 refills | Status: AC

## 2023-07-05 MED ORDER — METRONIDAZOLE 500 MG PO TABS: 500.0000 mg | ORAL_TABLET | Freq: Two times a day (BID) | ORAL | 0 refills | Status: DC

## 2023-07-24 ENCOUNTER — Ambulatory Visit
Admission: RE | Admit: 2023-07-24 | Discharge: 2023-07-24 | Disposition: A | Discharge status: Home / Self Care | Source: Ambulatory Visit | Attending: Emergency Medicine | Admitting: Emergency Medicine

## 2023-07-24 VITALS — BP 120/84 | HR 92 | Temp 98.4°F | Resp 16

## 2023-07-24 DIAGNOSIS — N939 Abnormal uterine and vaginal bleeding, unspecified: Secondary | ICD-10-CM | POA: Diagnosis present

## 2023-07-24 LAB — POCT URINALYSIS DIP (MANUAL ENTRY)
Bilirubin, UA: NEGATIVE
Glucose, UA: NEGATIVE mg/dL
Ketones, POC UA: NEGATIVE mg/dL
Leukocytes, UA: NEGATIVE
Nitrite, UA: NEGATIVE
Protein Ur, POC: NEGATIVE mg/dL
Spec Grav, UA: >=1.03 — AB (ref 1.010–1.025)
Urobilinogen, UA: 0.2 U/dL
pH, UA: 5.5 (ref 5.0–8.0)

## 2023-07-24 LAB — POCT URINE PREGNANCY: Preg Test, Ur: NEGATIVE
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
## 2023-07-24 NOTE — ED Triage Notes (Signed)
 Pt presents due to menstruation ongoing since July 9th. States cycle has been heavy. She has had episodes of dizziness. She is off on depo shot

## 2023-07-24 NOTE — Discharge Instructions (Addendum)
 You can take 800 mg of ibuprofen  every 8 hours to help with your abdominal cramping.  Please follow-up with your primary care provider regarding abnormal bleeding and continue the Depo injections.  We are checking some lab work and we will contact you if urgent follow-up is needed.  Seek immediate care at the nearest emergency department if you develop loss of consciousness, severe pain, or any new concerning symptoms.

## 2023-07-25 ENCOUNTER — Ambulatory Visit (HOSPITAL_COMMUNITY): Payer: Self-pay

## 2023-07-25 LAB — CBC WITH DIFFERENTIAL/PLATELET
Basophils Absolute: 0.1 x10E3/uL (ref 0.0–0.2)
Basos: 1 %
EOS (ABSOLUTE): 0.2 x10E3/uL (ref 0.0–0.4)
Eos: 2 %
Hematocrit: 41.2 % (ref 34.0–46.6)
Hemoglobin: 13 g/dL (ref 11.1–15.9)
Immature Grans (Abs): 0 x10E3/uL (ref 0.0–0.1)
Immature Granulocytes: 0 %
Lymphocytes Absolute: 1.5 x10E3/uL (ref 0.7–3.1)
Lymphs: 20 %
MCH: 28.3 pg (ref 26.6–33.0)
MCHC: 31.6 g/dL (ref 31.5–35.7)
MCV: 90 fL (ref 79–97)
Monocytes Absolute: 0.6 x10E3/uL (ref 0.1–0.9)
Monocytes: 7 %
Neutrophils Absolute: 5.4 x10E3/uL (ref 1.4–7.0)
Neutrophils: 70 %
Platelets: 229 x10E3/uL (ref 150–450)
RBC: 4.6 x10E6/uL (ref 3.77–5.28)
RDW: 13.1 % (ref 11.7–15.4)
WBC: 7.6 x10E3/uL (ref 3.4–10.8)

## 2023-07-25 LAB — CERVICOVAGINAL ANCILLARY ONLY
Chlamydia: NEGATIVE
Comment: NEGATIVE
Comment: NEGATIVE
Comment: NORMAL
Neisseria Gonorrhea: NEGATIVE
Trichomonas: NEGATIVE

## 2023-07-25 LAB — BASIC METABOLIC PANEL WITH GFR
BUN/Creatinine Ratio: 15 (ref 9–23)
BUN: 10 mg/dL (ref 6–20)
CO2: 21 mmol/L (ref 20–29)
Calcium: 9.1 mg/dL (ref 8.7–10.2)
Chloride: 107 mmol/L — ABNORMAL HIGH (ref 96–106)
Creatinine, Ser: 0.65 mg/dL (ref 0.57–1.00)
Glucose: 74 mg/dL (ref 70–99)
Potassium: 3.9 mmol/L (ref 3.5–5.2)
Sodium: 143 mmol/L (ref 134–144)
eGFR: 121 mL/min/1.73 (ref >59)

## 2023-08-28 ENCOUNTER — Other Ambulatory Visit: Payer: Self-pay

## 2023-08-28 ENCOUNTER — Ambulatory Visit
Admission: RE | Admit: 2023-08-28 | Discharge: 2023-08-28 | Disposition: A | Discharge status: Home / Self Care | Source: Ambulatory Visit | Attending: Physician Assistant

## 2023-08-28 VITALS — BP 110/71 | HR 83 | Temp 98.8°F | Resp 18 | Ht 69.5 in | Wt 177.7 lb

## 2023-08-28 DIAGNOSIS — R102 Pelvic and perineal pain: Secondary | ICD-10-CM | POA: Diagnosis present

## 2023-08-28 DIAGNOSIS — N898 Other specified noninflammatory disorders of vagina: Secondary | ICD-10-CM | POA: Insufficient documentation

## 2023-08-28 MED ORDER — NYSTATIN 100000 UNIT/GM EX CREA: TOPICAL_CREAM | CUTANEOUS | 0 refills | Status: AC

## 2023-08-28 MED ORDER — FLUCONAZOLE 150 MG PO TABS: 150.0000 mg | ORAL_TABLET | ORAL | 0 refills | Status: DC | PRN

## 2023-08-28 NOTE — Discharge Instructions (Addendum)
 You are seen today for concerns of vulvovaginal discomfort and vaginal discharge changes.  Your symptoms appear consistent with a likely yeast infection so we have collected a cervicovaginal swab to assess for BV, yeast, trichomonas, gonorrhea and chlamydia.  We will keep you updated on the results of your cervicovaginal swab once the results are available.  If any other medication or treatment is indicated by those results that will be sent into the pharmacy that we have on file. Please make sure that you are practicing safe sex and using barrier methods to prevent exposure. It is recommended to avoid intercourse until you have the results back from testing and have completed any treatments that are sent in for you.   To assist with your symptoms while we are waiting on your test results I have sent in a medication called Diflucan  for you to take once every 72 hours.  This medication treats yeast infections fairly well and has relatively low side effect burden.  To assist with any external irritation I am sending in a cream called nystatin .  This medication helps to treat yeast infections of the skin.  Please do not use this inside the vagina as it can cause further irritation.
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
## 2023-08-28 NOTE — ED Triage Notes (Signed)
 Pt presents to urgent care with complaints of rash on left inner thigh next to vaginal area x 3 days. Pt states the area is very red and tender to the touch. Currently rates overall pain a 4/10 at rest. Woke up at 4 AM this morning. Site was stinging badly, applied Vaseline with little improvement.   Pt also voices concerns of continuing her depo-injections for birth control. Worried about the bone marrow side effects. Reached out to her OB, did not provide any assurance.

## 2023-08-29 ENCOUNTER — Ambulatory Visit (HOSPITAL_COMMUNITY): Payer: Self-pay

## 2023-08-29 LAB — CERVICOVAGINAL ANCILLARY ONLY
Bacterial Vaginitis (gardnerella): POSITIVE — AB
Candida Glabrata: NEGATIVE
Candida Vaginitis: NEGATIVE
Chlamydia: NEGATIVE
Comment: NEGATIVE
Comment: NEGATIVE
Comment: NEGATIVE
Comment: NEGATIVE
Comment: NEGATIVE
Comment: NORMAL
Neisseria Gonorrhea: NEGATIVE
Trichomonas: NEGATIVE

## 2023-08-29 MED ORDER — METRONIDAZOLE 0.75 % VA GEL: 1.0000 | Freq: Every day | VAGINAL | 0 refills | Status: AC

## 2023-09-07 ENCOUNTER — Ambulatory Visit
Admission: RE | Admit: 2023-09-07 | Discharge: 2023-09-07 | Disposition: A | Discharge status: Home / Self Care | Attending: Physician Assistant | Admitting: Physician Assistant

## 2023-09-07 ENCOUNTER — Other Ambulatory Visit: Payer: Self-pay

## 2023-09-07 VITALS — BP 112/77 | HR 97 | Temp 98.2°F | Resp 18 | Ht 69.5 in | Wt 177.7 lb

## 2023-09-07 DIAGNOSIS — R319 Hematuria, unspecified: Secondary | ICD-10-CM | POA: Insufficient documentation

## 2023-09-07 DIAGNOSIS — J069 Acute upper respiratory infection, unspecified: Secondary | ICD-10-CM | POA: Insufficient documentation

## 2023-09-07 DIAGNOSIS — N390 Urinary tract infection, site not specified: Secondary | ICD-10-CM | POA: Insufficient documentation

## 2023-09-07 LAB — POCT URINE DIPSTICK
Bilirubin, UA: NEGATIVE
Glucose, UA: NEGATIVE mg/dL
Ketones, POC UA: NEGATIVE mg/dL
Nitrite, UA: POSITIVE — AB
POC PROTEIN,UA: 30 — AB
Spec Grav, UA: 1.025 (ref 1.010–1.025)
Urobilinogen, UA: 2 U/dL — AB
pH, UA: 6 (ref 5.0–8.0)

## 2023-09-07 MED ORDER — PHENAZOPYRIDINE HCL 100 MG PO TABS: 100.0000 mg | ORAL_TABLET | Freq: Three times a day (TID) | ORAL | 0 refills | Status: AC | PRN

## 2023-09-07 MED ORDER — FLUCONAZOLE 150 MG PO TABS: 150.0000 mg | ORAL_TABLET | ORAL | 0 refills | Status: AC | PRN

## 2023-09-07 MED ORDER — NITROFURANTOIN MONOHYD MACRO 100 MG PO CAPS: 100.0000 mg | ORAL_CAPSULE | Freq: Two times a day (BID) | ORAL | 0 refills | Status: AC

## 2023-09-07 NOTE — ED Triage Notes (Addendum)
 Pt presents with complaints of nasal congestion and urinary frequency. States she has been having nasal congestion for three days. Taking Mucinex cough drops with minimal improvement/relief. Around someone with sinus infection at her work recently.  Also mentions urinary frequency + dysuria that started during the night, 9/6. Pain increases with urination. Currently rates pain an 8/10.

## 2023-09-07 NOTE — Discharge Instructions (Addendum)
 Based on your symptoms and results of the urinalysis I believe you have a UTI I recommend the following:  I have sent in a script for Macrobid  100 mg to be taken by mouth twice per day for 5 days. Please finish the entire course of the antibiotic even if you are feeling better before it is completed unless you develop an allergic reaction or are told by a medical provider to stop taking it. We have sent a sample of your urine off for a urine culture.  This will help us  determine what bacteria is causing your symptoms as well as the most appropriate antibiotic to treat it.  If we need to make any adjustments to your medication regimen and new medication will be sent to the pharmacy on file and you will be updated via phone call in MyChart. I am also sending in a script for Pyridium  to help with the pain with urination. This can turn your urine orange so do not be alarmed if this occurs.  Stay well hydrated (at least 75 oz of water  per day) and avoid holding your urine for prolonged periods of time. If you have any of the following please return to urgent care or go to the emergency room: Persistent symptoms, fever, trouble urinating or inability to urinate, confusion, flank pain.   Based on your described symptoms and the duration of symptoms it is likely that you have a viral upper respiratory infection (often called a cold)  Symptoms can last for 3-10 days with lingering cough and intermittent symptoms potentially  lasting several  weeks after that.  The goal of treatment at this time is to reduce your symptoms and discomfort   You can use the following medications and measures to help yourself feel better until your body fights this off: DayQuil/NyQuil, TheraFlu, Alka-Seltzer  (these medications typically have the same active ingredients in them so you can choose whichever one you prefer and take consistently during the day and night according to the manufactures instructions.) Flonase A daily  antihistamine such as Zyrtec , Claritin, Allegra per your preference.  Please choose 1 and take consistently. Increased fluids.  It is recommended that you take in at least 64 ounces of water  per day when you are not sick so it is important to increase this when you are sick and your body may be running fever. Rest Cough drops Chloraseptic throat spray to help with sore throat Nasal saline spray or nasal flushes to help with congestion and runny nose  If your symptoms seem like they are getting worse over the next 5 to 7 days or not improving you can always follow-up here in urgent care or go to your primary care provider for further management. Go to the ER if you begin to have more serious symptoms such as shortness of breath, trouble breathing, loss of consciousness, swelling around the eyes, high fever, severe lasting headaches, vision changes or neck pain/stiffness.
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
## 2023-09-09 ENCOUNTER — Ambulatory Visit: Payer: Self-pay

## 2023-09-09 LAB — URINE CULTURE: Culture: >=100000 — AB

## 2023-09-24 ENCOUNTER — Emergency Department (HOSPITAL_COMMUNITY)
Admission: EM | Admit: 2023-09-24 | Discharge: 2023-09-24 | Disposition: A | Discharge status: Home / Self Care | Attending: Emergency Medicine | Admitting: Emergency Medicine

## 2023-09-24 ENCOUNTER — Encounter (HOSPITAL_COMMUNITY): Payer: Self-pay | Admitting: *Deleted

## 2023-09-24 ENCOUNTER — Other Ambulatory Visit: Payer: Self-pay

## 2023-09-24 DIAGNOSIS — D72829 Elevated white blood cell count, unspecified: Secondary | ICD-10-CM | POA: Diagnosis not present

## 2023-09-24 DIAGNOSIS — R55 Syncope and collapse: Secondary | ICD-10-CM | POA: Insufficient documentation

## 2023-09-24 LAB — URINALYSIS, W/ REFLEX TO CULTURE (INFECTION SUSPECTED)
Bilirubin Urine: NEGATIVE
Glucose, UA: NEGATIVE mg/dL
Hgb urine dipstick: NEGATIVE
Ketones, ur: NEGATIVE mg/dL
Leukocytes,Ua: NEGATIVE
Nitrite: NEGATIVE
Protein, ur: NEGATIVE mg/dL
Specific Gravity, Urine: 1.004 — ABNORMAL LOW (ref 1.005–1.030)
pH: 5 (ref 5.0–8.0)

## 2023-09-24 LAB — CBC WITH DIFFERENTIAL/PLATELET
Abs Immature Granulocytes: 0.03 K/uL (ref 0.00–0.07)
Basophils Absolute: 0.1 K/uL (ref 0.0–0.1)
Basophils Relative: 1 %
Eosinophils Absolute: 0.3 K/uL (ref 0.0–0.5)
Eosinophils Relative: 3 %
HCT: 37.2 % (ref 36.0–46.0)
Hemoglobin: 12.1 g/dL (ref 12.0–15.0)
Immature Granulocytes: 0 %
Lymphocytes Relative: 17 %
Lymphs Abs: 1.9 K/uL (ref 0.7–4.0)
MCH: 27.9 pg (ref 26.0–34.0)
MCHC: 32.5 g/dL (ref 30.0–36.0)
MCV: 85.7 fL (ref 80.0–100.0)
Monocytes Absolute: 1.2 K/uL — ABNORMAL HIGH (ref 0.1–1.0)
Monocytes Relative: 10 %
Neutro Abs: 7.8 K/uL — ABNORMAL HIGH (ref 1.7–7.7)
Neutrophils Relative %: 69 %
Platelets: 205 K/uL (ref 150–400)
RBC: 4.34 MIL/uL (ref 3.87–5.11)
RDW: 15 % (ref 11.5–15.5)
WBC: 11.2 K/uL — ABNORMAL HIGH (ref 4.0–10.5)
nRBC: 0 % (ref 0.0–0.2)

## 2023-09-24 LAB — URINE DRUG SCREEN
Amphetamines: NEGATIVE
Barbiturates: NEGATIVE
Benzodiazepines: NEGATIVE
Cocaine: NEGATIVE
Fentanyl: NEGATIVE
Methadone Scn, Ur: NEGATIVE
Opiates: NEGATIVE
Tetrahydrocannabinol: NEGATIVE

## 2023-09-24 LAB — I-STAT CHEM 8, ED
BUN: 8 mg/dL (ref 6–20)
Calcium, Ion: 0.93 mmol/L — ABNORMAL LOW (ref 1.15–1.40)
Chloride: 106 mmol/L (ref 98–111)
Creatinine, Ser: 0.7 mg/dL (ref 0.44–1.00)
Glucose, Bld: 96 mg/dL (ref 70–99)
HCT: 38 % (ref 36.0–46.0)
Hemoglobin: 12.9 g/dL (ref 12.0–15.0)
Potassium: 3.8 mmol/L (ref 3.5–5.1)
Sodium: 141 mmol/L (ref 135–145)
TCO2: 21 mmol/L — ABNORMAL LOW (ref 22–32)

## 2023-09-24 LAB — HCG, SERUM, QUALITATIVE: Preg, Serum: NEGATIVE

## 2023-09-24 MED ORDER — ONDANSETRON HCL 4 MG/2ML IJ SOLN
4.0000 mg | Freq: Once | INTRAMUSCULAR | Status: AC
  Administered 2023-09-24: 4 mg via INTRAVENOUS
  Filled 2023-09-24: qty 2

## 2023-09-24 MED ORDER — SODIUM CHLORIDE 0.9 % IV BOLUS
500.0000 mL | Freq: Once | INTRAVENOUS | Status: AC
  Administered 2023-09-24: 500 mL via INTRAVENOUS

## 2023-09-24 NOTE — ED Provider Notes (Signed)
 L[facility] Johnston EMERGENCY DEPARTMENT AT [facility] Provider Note   CSN: [id number] Arrival date & time: 09/24/23  0425     Patient presents with: Dizziness   Lori Johnston is a 33 y.o. female.   The history is provided by the patient.  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 Severity:  Moderate Onset quality:  Gradual Duration: hours. Timing:  Constant Progression:  Resolved Chronicity:  New Context:  Drank a 16-20 ounc alcoholic beverage Relieved by:  Nothing Worsened by:  Nothing Ineffective treatments:  Skittles Associated symptoms: no abdominal pain, no ear pain, no fever, no headaches, no rash, no rhinorrhea, no shortness of breath, no sore throat, no vomiting and no wheezing    Past Medical History:  Diagnosis Date   Abnormal uterine bleeding (AUB) 05/28/2014   Bronchitis    BV (bacterial vaginosis) 12/10/2012   Contraceptive education 07/16/2013   Contraceptive management 04/21/2015   Decreased appetite 05/28/2014   Dyspareunia 12/10/2012   Gonorrhea affecting pregnancy in first trimester 05/04/2016   Treated in ER 5/1   Hematuria 07/06/2015   History of chlamydia    History of PID 07/16/2013   Irregular intermenstrual bleeding 07/06/2015   Irregular periods 01/18/2015   Migraine    Nausea 01/18/2015   Nexplanon in place 07/06/2014   Nexplanon insertion 08/27/2013   Pain with urination 07/06/2015   PID (pelvic inflammatory disease)    RUQ pain 07/16/2013   Strain of rectus abdominis muscle 06/10/2014   Umbilical hernia 06/10/2014   UTI (lower urinary tract infection)    Vaginal discharge 12/10/2012   Weight loss 07/06/2014   Wheezing on expiration 12/09/2014   Yeast infection 07/16/2013       Prior to Admission medications   Medication Sig Start Date End Date Taking? Authorizing Provider   fluconazole  (DIFLUCAN ) 150 MG tablet Take 1 tablet (150 mg total) by mouth every three (3) days as needed. May repeat in 3 days if symptoms not resolved 09/07/23   Mecum, Erin E, PA-C  ketoconazole  (NIZORAL ) 2 % cream Apply 1 Application topically daily. To affected area till better 05/30/23   Vonna Sharlet POUR, MD  medroxyPROGESTERone  (DEPO-PROVERA ) 150 MG/ML injection medroxyprogesterone  150 mg/mL intramuscular suspension  TAKE INJECT SUSPENSION INTRAMUSCULAR EVERY 3 MONTHS.    [provider]  medroxyPROGESTERone  (PROVERA ) 10 MG tablet Take 1 tablet (10 mg total) by mouth daily for 10 days. 05/30/23 06/09/23  Vonna Sharlet POUR, MD  nitrofurantoin , macrocrystal-monohydrate, (MACROBID ) 100 MG capsule Take 1 capsule (100 mg total) by mouth 2 (two) times daily. 09/07/23   Mecum, Erin E, PA-C  nystatin  cream (MYCOSTATIN ) Apply to affected area 2 times daily 08/28/23   Mecum, Erin E, PA-C  phenazopyridine  (PYRIDIUM ) 100 MG tablet Take 1 tablet (100 mg total) by mouth 3 (three) times daily as needed. 09/07/23   Mecum, Erin E, PA-C  triamcinolone  (KENALOG ) 0.025 % ointment Apply 1 Application topically 2 (two) times daily. 07/03/23   Sofia, Leslie K, PA-C  dicyclomine  (BENTYL ) 20 MG tablet Take 1 tablet (20 mg total) by mouth 2 (two) times daily. 07/26/18 09/12/18  Caccavale, Sophia, PA-C    Allergies: Bee venom and Other    Review of Systems  Constitutional:  Negative for fever.  HENT:  Negative for ear pain, rhinorrhea and sore throat.  Eyes:  Negative for visual disturbance.  Respiratory:  Negative for shortness of breath and wheezing.   Gastrointestinal:  Negative for abdominal pain and vomiting.  Genitourinary:  Negative for dysuria.  Skin:  Negative for rash.  Neurological:  Negative for headaches.    Updated Vital Signs BP (!) 142/76   Pulse 81   Temp 98.7 F (37.1 C) (Oral)   Resp 10   Ht 5' 9.75 (1.772 m)   LMP 07/05/2023   SpO2 100%   BMI 25.68 kg/m   Physical Exam Vitals  and nursing note reviewed.  Constitutional:      General: She is not in acute distress.    Appearance: Normal appearance. She is well-developed.  HENT:     Head: Normocephalic and atraumatic.     Nose: Nose normal.  Eyes:     Pupils: Pupils are equal, round, and reactive to light.  Cardiovascular:     Rate and Rhythm: Normal rate and regular rhythm.     Pulses: Normal pulses.     Heart sounds: Normal heart sounds.  Pulmonary:     Effort: Pulmonary effort is normal. No respiratory distress.     Breath sounds: Normal breath sounds.  Abdominal:     General: Bowel sounds are normal. There is no distension.     Palpations: Abdomen is soft.     Tenderness: There is no abdominal tenderness. There is no guarding or rebound.  Musculoskeletal:        General: Normal range of motion.     Cervical back: Normal range of motion and neck supple.  Skin:    General: Skin is warm and dry.     Capillary Refill: Capillary refill takes less than 2 seconds.     Coloration: Skin is not pale.     Findings: No bruising, erythema or rash.  Neurological:     General: No focal deficit present.     Mental Status: She is alert and oriented to person, place, and time.     Deep Tendon Reflexes: Reflexes normal.  Psychiatric:        Mood and Affect: Mood normal.     (all labs ordered are listed, but only abnormal results are displayed) Results for orders placed or performed during the hospital encounter of 09/24/23  CBC with Differential   Collection Time: 09/24/23  4:49 AM  Result Value Ref Range   WBC 11.2 (H) 4.0 - 10.5 K/uL   RBC 4.34 3.87 - 5.11 MIL/uL   Hemoglobin 12.1 12.0 - 15.0 g/dL   HCT 62.7 63.9 - 53.9 %   MCV 85.7 80.0 - 100.0 fL   MCH 27.9 26.0 - 34.0 pg   MCHC 32.5 30.0 - 36.0 g/dL   RDW 84.9 88.4 - 84.4 %   Platelets 205 150 - 400 K/uL   nRBC 0.0 0.0 - 0.2 %   Neutrophils Relative % 69 %   Neutro Abs 7.8 (H) 1.7 - 7.7 K/uL   Lymphocytes Relative 17 %   Lymphs Abs 1.9 0.7 - 4.0  K/uL   Monocytes Relative 10 %   Monocytes Absolute 1.2 (H) 0.1 - 1.0 K/uL   Eosinophils Relative 3 %   Eosinophils Absolute 0.3 0.0 - 0.5 K/uL   Basophils Relative 1 %   Basophils Absolute 0.1 0.0 - 0.1 K/uL   Immature Granulocytes 0 %   Abs Immature Granulocytes 0.03 0.00 - 0.07 K/uL  hCG, serum, qualitative   Collection Time: 09/24/23  4:49 AM  Result  Value Ref Range   Preg, Serum NEGATIVE NEGATIVE  Urinalysis, w/ Reflex to Culture (Infection Suspected) -Urine, Clean Catch   Collection Time: 09/24/23  5:00 AM  Result Value Ref Range   Specimen Source URINE, CLEAN CATCH    Color, Urine STRAW (A) YELLOW   APPearance CLEAR CLEAR   Specific Gravity, Urine 1.004 (L) 1.005 - 1.030   pH 5.0 5.0 - 8.0   Glucose, UA NEGATIVE NEGATIVE mg/dL   Hgb urine dipstick NEGATIVE NEGATIVE   Bilirubin Urine NEGATIVE NEGATIVE   Ketones, ur NEGATIVE NEGATIVE mg/dL   Protein, ur NEGATIVE NEGATIVE mg/dL   Nitrite NEGATIVE NEGATIVE   Leukocytes,Ua NEGATIVE NEGATIVE   RBC / HPF 0-5 0 - 5 RBC/hpf   WBC, UA 0-5 0 - 5 WBC/hpf   Bacteria, UA RARE (A) NONE SEEN   Squamous Epithelial / HPF 0-5 0 - 5 /HPF   Mucus PRESENT   Urine Drug Screen   Collection Time: 09/24/23  5:00 AM  Result Value Ref Range   Opiates NEGATIVE NEGATIVE   Cocaine NEGATIVE NEGATIVE   Benzodiazepines NEGATIVE NEGATIVE   Amphetamines NEGATIVE NEGATIVE   Tetrahydrocannabinol NEGATIVE NEGATIVE   Barbiturates NEGATIVE NEGATIVE   Methadone Scn, Ur NEGATIVE NEGATIVE   Fentanyl  NEGATIVE NEGATIVE  I-stat chem 8, ED (not at [facility], [facility] or [facility])   Collection Time: 09/24/23  5:05 AM  Result Value Ref Range   Sodium 141 135 - 145 mmol/L   Potassium 3.8 3.5 - 5.1 mmol/L   Chloride 106 98 - 111 mmol/L   BUN 8 6 - 20 mg/dL   Creatinine, Ser 9.29 0.44 - 1.00 mg/dL   Glucose, Bld 96 70 - 99 mg/dL   Calcium , Ion 0.93 (L) 1.15 - 1.40 mmol/L   TCO2 21 (L) 22 - 32 mmol/L   Hemoglobin 12.9 12.0 - 15.0 g/dL   HCT 61.9 63.9 - 53.9 %   No  results found.   EKG: EKG Interpretation Date/Time:  Tuesday September 24 2023 04:53:30 EDT Ventricular Rate:  81 PR Interval:  186 QRS Duration:  111 QT Interval:  361 QTC Calculation: 419 R Axis:   42  Text Interpretation: Sinus rhythm Confirmed by Nettie, Caidynce Muzyka ([id number]) on 09/24/2023 5:15:05 AM  Radiology: No results found.   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  Was given several skittles for this by family   Amount and/or Complexity of Data Reviewed Independent Historian: EMS    Details: See above  External Data Reviewed: notes.    Details: Previous notes reviewed  Labs: ordered.    Details: Urine is negative for UTI. White count 11.2, hemoglobin normal 12, normal platelets.  Pregnancy is negative normal sodium 141, normal potassium 3.8, normal creatinine normal glucose 96.   ECG/medicine tests: ordered and independent interpretation performed. Decision-making details documented in ED Course.  Risk Prescription drug management. Risk Details: Well appearing.  AO4.  Hydrated in the ED.  16-20 ounces is a fair amount of alcohol and I suspect this played a role in how the patient felt.  She is awake alert and has PO challenged in the department.  No signs of trauma nor other emergent conditions necessitating imaging or admission.  She is stable for discharge with close follow up.  Strict returns      Final diagnoses:  Near syncope  No signs of systemic illness or infection. The patient is nontoxic-appearing on exam and vital signs are within normal limits.  I  have reviewed the triage vital signs and the nursing notes. Pertinent labs & imaging results that were available during my care of the patient were reviewed by me and considered in my medical decision making (see chart for details). After history, exam, and medical workup I feel the patient has been appropriately medically screened and is safe for discharge home. Pertinent diagnoses were discussed with the patient. Patient was given return precautions.   ED Discharge Orders     None          Trayden Brandy, MD 09/24/23 [phone]

## 2023-09-24 NOTE — ED Triage Notes (Signed)
 Patient reports that she has not felt well tonight. She has dizziness, nausea. Dizziness worse with standing up. She reports her LMP is July, had the depo shot and it is overdue.

## 2023-09-24 NOTE — ED Triage Notes (Signed)
 Pt arrives from home via [facility], Pt went out to bar, had 1 drink, sas not been feeling well tonight, went home, started to feel like she was going to pass out, laid down and had transient loss of consciousness (could hear family but could not respond), family gave skittles with concerns of low blood sugar. She has hx of anemia. 12 lead unremarkable 105 sbp systolic, 200 ns given, repeat pressure 124/86, SR at 84. Also c/o nausea, gave 4 zofran  enroute.  Last period 7/4. cbg 136

## 2023-10-22 ENCOUNTER — Encounter: Payer: Self-pay | Admitting: Emergency Medicine

## 2023-10-22 ENCOUNTER — Ambulatory Visit: Payer: Self-pay

## 2023-10-22 ENCOUNTER — Ambulatory Visit
Admission: EM | Admit: 2023-10-22 | Discharge: 2023-10-22 | Disposition: A | Discharge status: Home / Self Care | Attending: Emergency Medicine | Admitting: Emergency Medicine

## 2023-10-22 DIAGNOSIS — K0889 Other specified disorders of teeth and supporting structures: Secondary | ICD-10-CM

## 2023-10-22 DIAGNOSIS — Z720 Tobacco use: Secondary | ICD-10-CM

## 2023-10-22 DIAGNOSIS — K047 Periapical abscess without sinus: Secondary | ICD-10-CM | POA: Diagnosis not present

## 2023-10-22 LAB — POCT URINE PREGNANCY: Preg Test, Ur: NEGATIVE

## 2023-10-22 MED ORDER — AMOXICILLIN-POT CLAVULANATE 875-125 MG PO TABS: 1.0000 | ORAL_TABLET | Freq: Two times a day (BID) | ORAL | 0 refills | Status: AC

## 2023-10-22 NOTE — Discharge Instructions (Addendum)
 Your pregnancy test was negative Please follow-up with your PCP-call for appointment Rest, push fluids, take amoxicillin  as prescribed, follow-up with dental provider of your choice If you develop fever unable to take medication or worsening symptoms or facial swelling go to the emergency room for further evaluation
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
## 2023-10-22 NOTE — ED Triage Notes (Signed)
 Pt c/o upper and lower dental pain. States she had filling come out of top tooth.

## 2023-11-08 ENCOUNTER — Ambulatory Visit: Admitting: Radiology

## 2023-11-08 ENCOUNTER — Ambulatory Visit
Admission: RE | Admit: 2023-11-08 | Discharge: 2023-11-08 | Disposition: A | Discharge status: Home / Self Care | Source: Ambulatory Visit | Attending: Internal Medicine | Admitting: Internal Medicine

## 2023-11-08 VITALS — BP 103/70 | HR 93 | Temp 97.9°F | Resp 17

## 2023-11-08 DIAGNOSIS — J22 Unspecified acute lower respiratory infection: Secondary | ICD-10-CM

## 2023-11-08 DIAGNOSIS — J209 Acute bronchitis, unspecified: Secondary | ICD-10-CM | POA: Diagnosis not present

## 2023-11-08 DIAGNOSIS — R051 Acute cough: Secondary | ICD-10-CM

## 2023-11-08 DIAGNOSIS — R0602 Shortness of breath: Secondary | ICD-10-CM

## 2023-11-08 MED ORDER — PROMETHAZINE-DM 6.25-15 MG/5ML PO SYRP: 5.0000 mL | ORAL_SOLUTION | Freq: Three times a day (TID) | ORAL | 0 refills | Status: DC | PRN

## 2023-11-08 MED ORDER — AZITHROMYCIN 250 MG PO TABS: ORAL_TABLET | ORAL | 0 refills | Status: AC

## 2023-11-08 MED ORDER — PREDNISONE 20 MG PO TABS: 40.0000 mg | ORAL_TABLET | Freq: Every day | ORAL | 0 refills | Status: AC

## 2023-11-08 MED ORDER — ALBUTEROL SULFATE HFA 108 (90 BASE) MCG/ACT IN AERS: 1.0000 | INHALATION_SPRAY | Freq: Four times a day (QID) | RESPIRATORY_TRACT | 0 refills | Status: AC | PRN

## 2023-11-08 NOTE — ED Triage Notes (Signed)
 Pt c/o wet cough, chills, and congestion for 2.5weeks. States she pulled out a long strand of mucous last night   She has been taking mucinex and hot tea with not relief
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
## 2023-11-08 NOTE — Discharge Instructions (Addendum)
 Chest x-ray done today.  Final evaluation by the radiologist is still pending but on brief evaluation as well as comparison to previous chest x-ray there does not appear to be any acute findings.  Once the radiologist has finalized the report, if there is any changes needed in treatment we will contact you. Symptoms, duration and physical exam findings are consistent with a respiratory infection with bronchitis. Due to the duration of symptoms and persistent severity, we will treat with the following:  Azithromycin  250mg  Take 2 tablets today and the 1 tablet daily for 4 more days. Albuterol  inhaler 1-2 puffs every 6 hours as needed for wheezing/shortness of breath. Promethazine  DM 5 mL every 8 hours as needed for cough.  Use caution as this medication can cause drowsiness. Prednisone  40 mg (2 tablets) once daily for 5 days. Take this in the morning.  This is a steroid to help with inflammation and pain. Make sure to stay hydrated by drinking plenty of water . Return to urgent care or PCP if symptoms worsen or fail to resolve.

## 2024-01-09 ENCOUNTER — Ambulatory Visit
Admission: RE | Admit: 2024-01-09 | Discharge: 2024-01-09 | Disposition: A | Discharge status: Home / Self Care | Source: Ambulatory Visit

## 2024-01-09 VITALS — HR 84 | Resp 16

## 2024-01-09 DIAGNOSIS — R0982 Postnasal drip: Secondary | ICD-10-CM | POA: Diagnosis present

## 2024-01-09 DIAGNOSIS — Z113 Encounter for screening for infections with a predominantly sexual mode of transmission: Secondary | ICD-10-CM | POA: Insufficient documentation

## 2024-01-09 LAB — POCT URINE DIPSTICK
Bilirubin, UA: NEGATIVE
Glucose, UA: NEGATIVE mg/dL
Ketones, POC UA: NEGATIVE mg/dL
Leukocytes, UA: NEGATIVE
Nitrite, UA: NEGATIVE
POC PROTEIN,UA: NEGATIVE
Spec Grav, UA: >=1.03 — AB
Urobilinogen, UA: 1 U/dL
pH, UA: 5.5

## 2024-01-09 LAB — POCT URINE PREGNANCY: Preg Test, Ur: NEGATIVE

## 2024-01-09 MED ORDER — FLUTICASONE PROPIONATE 50 MCG/ACT NA SUSP: 1.0000 | Freq: Every day | NASAL | 0 refills | Status: AC

## 2024-01-09 MED ORDER — FEXOFENADINE-PSEUDOEPHED ER 60-120 MG PO TB12: 1.0000 | ORAL_TABLET | Freq: Every evening | ORAL | 0 refills | Status: AC

## 2024-01-09 NOTE — ED Triage Notes (Addendum)
 Pt c/o brownish vaginal discharge for about 1 week. States she recently began having intercourse again and wants STD testing.    She also c/o feeling she has something in her throat and when she brushes her teeth she get up long strands of clear mucous. Symptoms have been going on for about 1 month Denies any cough or congestion.

## 2024-01-09 NOTE — Discharge Instructions (Signed)
" °  Vaginal Discharge / STI Testing Testing today includes bacterial vaginosis, yeast, chlamydia, gonorrhea, trichomonas, HIV, and syphilis (RPR). You will be contacted if any test results are positive, and treatment will be provided as needed. If all results are negative and symptoms continue, please follow up with your OB/GYN for further evaluation. Avoid sexual activity until test results are available and symptoms improve. Post-Nasal Drip Use Flonase  nasal spray once daily as directed. Take Allegra-D as directed for allergy symptoms and drainage. Symptoms should gradually improve over several days. When to Seek Care Return or seek care sooner for worsening symptoms, pelvic pain, fever, foul-smelling discharge, or new concerns.  "
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
## 2024-01-10 ENCOUNTER — Ambulatory Visit (HOSPITAL_COMMUNITY): Payer: Self-pay

## 2024-01-10 LAB — CERVICOVAGINAL ANCILLARY ONLY
Bacterial Vaginitis (gardnerella): POSITIVE — AB
Candida Glabrata: NEGATIVE
Candida Vaginitis: NEGATIVE
Chlamydia: NEGATIVE
Comment: NEGATIVE
Comment: NEGATIVE
Comment: NEGATIVE
Comment: NEGATIVE
Comment: NEGATIVE
Comment: NORMAL
Neisseria Gonorrhea: NEGATIVE
Trichomonas: NEGATIVE

## 2024-01-10 LAB — SYPHILIS: RPR W/REFLEX TO RPR TITER AND TREPONEMAL ANTIBODIES, TRADITIONAL SCREENING AND DIAGNOSIS ALGORITHM: RPR Ser Ql: NONREACTIVE

## 2024-01-10 LAB — HIV ANTIBODY (ROUTINE TESTING W REFLEX): HIV Screen 4th Generation wRfx: NONREACTIVE

## 2024-01-10 MED ORDER — METRONIDAZOLE 0.75 % VA GEL: 1.0000 | Freq: Every day | VAGINAL | 0 refills | Status: AC

## 2024-01-31 ENCOUNTER — Ambulatory Visit
Admission: RE | Admit: 2024-01-31 | Discharge: 2024-01-31 | Disposition: A | Discharge status: Home / Self Care | Payer: Self-pay | Source: Ambulatory Visit

## 2024-01-31 VITALS — BP 117/75 | HR 75 | Temp 98.1°F | Resp 17

## 2024-01-31 DIAGNOSIS — R0982 Postnasal drip: Secondary | ICD-10-CM

## 2024-01-31 MED ORDER — PSEUDOEPH-BROMPHEN-DM 30-2-10 MG/5ML PO SYRP: 10.0000 mL | ORAL_SOLUTION | Freq: Four times a day (QID) | ORAL | 0 refills | Status: AC | PRN

## 2024-01-31 MED ORDER — MONTELUKAST SODIUM 10 MG PO TABS: 10.0000 mg | ORAL_TABLET | Freq: Every day | ORAL | 0 refills | Status: AC

## 2024-01-31 MED ORDER — PROMETHAZINE-DM 6.25-15 MG/5ML PO SYRP: 5.0000 mL | ORAL_SOLUTION | Freq: Every evening | ORAL | 0 refills | Status: AC | PRN

## 2024-01-31 NOTE — ED Triage Notes (Signed)
 Pt c/o congestion, cough, and postnasal drainage since November. States she has been on abx in past for symptoms but they have not went away.
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
